# Patient Record
Sex: Male | Born: 1935
Health system: Southern US, Community
[De-identification: ages and names within clinical notes are randomized; demographics above are authoritative.]

## PROBLEM LIST (undated history)

## (undated) DIAGNOSIS — T4145XA Adverse effect of unspecified anesthetic, initial encounter: Secondary | ICD-10-CM

## (undated) DIAGNOSIS — N189 Chronic kidney disease, unspecified: Secondary | ICD-10-CM

## (undated) DIAGNOSIS — E785 Hyperlipidemia, unspecified: Secondary | ICD-10-CM

## (undated) DIAGNOSIS — C801 Malignant (primary) neoplasm, unspecified: Secondary | ICD-10-CM

## (undated) DIAGNOSIS — M545 Low back pain, unspecified: Secondary | ICD-10-CM

## (undated) DIAGNOSIS — I2510 Atherosclerotic heart disease of native coronary artery without angina pectoris: Secondary | ICD-10-CM

## (undated) DIAGNOSIS — G8929 Other chronic pain: Secondary | ICD-10-CM

## (undated) DIAGNOSIS — K000 Anodontia: Secondary | ICD-10-CM

## (undated) DIAGNOSIS — I10 Essential (primary) hypertension: Secondary | ICD-10-CM

## (undated) DIAGNOSIS — J358 Other chronic diseases of tonsils and adenoids: Secondary | ICD-10-CM

## (undated) DIAGNOSIS — K08109 Complete loss of teeth, unspecified cause, unspecified class: Secondary | ICD-10-CM

## (undated) DIAGNOSIS — R7301 Impaired fasting glucose: Secondary | ICD-10-CM

## (undated) DIAGNOSIS — M109 Gout, unspecified: Secondary | ICD-10-CM

## (undated) DIAGNOSIS — T8859XA Other complications of anesthesia, initial encounter: Secondary | ICD-10-CM

## (undated) HISTORY — PX: OTHER SURGICAL HISTORY: SHX169

## (undated) HISTORY — PX: CARDIAC CATHETERIZATION: SHX172

## (undated) HISTORY — PX: HEMORRHOID SURGERY: SHX153

---

## 1995-05-03 HISTORY — PX: RETROPUBIC PROSTATECTOMY: SUR1055

## 1998-01-21 ENCOUNTER — Inpatient Hospital Stay (HOSPITAL_COMMUNITY): Admission: EM | Admit: 1998-01-21 | Discharge: 1998-01-23 | Payer: Self-pay | Admitting: Emergency Medicine

## 2004-01-12 ENCOUNTER — Ambulatory Visit (HOSPITAL_COMMUNITY): Admission: RE | Admit: 2004-01-12 | Discharge: 2004-01-12 | Payer: Self-pay | Admitting: Interventional Cardiology

## 2004-06-08 ENCOUNTER — Emergency Department (HOSPITAL_COMMUNITY): Admission: EM | Admit: 2004-06-08 | Discharge: 2004-06-08 | Payer: Self-pay | Admitting: Emergency Medicine

## 2005-08-31 ENCOUNTER — Encounter: Payer: Self-pay | Admitting: Emergency Medicine

## 2012-03-05 ENCOUNTER — Other Ambulatory Visit: Payer: Self-pay | Admitting: Interventional Cardiology

## 2012-03-07 ENCOUNTER — Ambulatory Visit (HOSPITAL_COMMUNITY): Payer: Medicare Other | Admitting: *Deleted

## 2012-03-07 ENCOUNTER — Encounter (HOSPITAL_COMMUNITY): Payer: Self-pay | Admitting: *Deleted

## 2012-03-07 ENCOUNTER — Encounter (HOSPITAL_COMMUNITY)
Admission: RE | Disposition: A | Discharge status: Home / Self Care | Payer: Self-pay | Source: Ambulatory Visit | Attending: Interventional Cardiology

## 2012-03-07 ENCOUNTER — Ambulatory Visit (HOSPITAL_COMMUNITY)
Admission: RE | Admit: 2012-03-07 | Discharge: 2012-03-07 | Disposition: A | Discharge status: Home / Self Care | Payer: Medicare Other | Source: Ambulatory Visit | Attending: Interventional Cardiology | Admitting: Interventional Cardiology

## 2012-03-07 DIAGNOSIS — I10 Essential (primary) hypertension: Secondary | ICD-10-CM

## 2012-03-07 DIAGNOSIS — I2510 Atherosclerotic heart disease of native coronary artery without angina pectoris: Secondary | ICD-10-CM

## 2012-03-07 DIAGNOSIS — I25810 Atherosclerosis of coronary artery bypass graft(s) without angina pectoris: Secondary | ICD-10-CM

## 2012-03-07 DIAGNOSIS — I4891 Unspecified atrial fibrillation: Secondary | ICD-10-CM | POA: Insufficient documentation

## 2012-03-07 HISTORY — DX: Other complications of anesthesia, initial encounter: T88.59XA

## 2012-03-07 HISTORY — PX: CARDIOVERSION: SHX1299

## 2012-03-07 HISTORY — DX: Essential (primary) hypertension: I10

## 2012-03-07 HISTORY — DX: Anodontia: K00.0

## 2012-03-07 HISTORY — DX: Atherosclerotic heart disease of native coronary artery without angina pectoris: I25.10

## 2012-03-07 HISTORY — DX: Adverse effect of unspecified anesthetic, initial encounter: T41.45XA

## 2012-03-07 HISTORY — DX: Other chronic pain: G89.29

## 2012-03-07 HISTORY — DX: Malignant (primary) neoplasm, unspecified: C80.1

## 2012-03-07 HISTORY — DX: Unspecified atrial fibrillation: I48.91

## 2012-03-07 HISTORY — DX: Hyperlipidemia, unspecified: E78.5

## 2012-03-07 HISTORY — DX: Impaired fasting glucose: R73.01

## 2012-03-07 HISTORY — DX: Low back pain: M54.5

## 2012-03-07 HISTORY — DX: Other chronic diseases of tonsils and adenoids: J35.8

## 2012-03-07 HISTORY — DX: Low back pain, unspecified: M54.50

## 2012-03-07 HISTORY — DX: Complete loss of teeth, unspecified cause, unspecified class: K08.109

## 2012-03-07 HISTORY — DX: Gout, unspecified: M10.9

## 2012-03-07 HISTORY — DX: Chronic kidney disease, unspecified: N18.9

## 2012-03-07 SURGERY — CARDIOVERSION
Anesthesia: Monitor Anesthesia Care | Wound class: Clean

## 2012-03-07 MED ORDER — SODIUM CHLORIDE 0.9 % IV SOLN
INTRAVENOUS | Status: DC
  Administered 2012-03-07: 500 mL via INTRAVENOUS

## 2012-03-07 MED ORDER — AMIODARONE HCL 200 MG PO TABS: 200.0000 mg | ORAL_TABLET | Freq: Every day | ORAL | Status: DC

## 2012-03-07 MED ORDER — LIDOCAINE HCL (CARDIAC) 20 MG/ML IV SOLN
INTRAVENOUS | Status: DC | PRN
  Administered 2012-03-07: 40 mg via INTRAVENOUS

## 2012-03-07 MED ORDER — PROPOFOL 10 MG/ML IV BOLUS
INTRAVENOUS | Status: DC | PRN
  Administered 2012-03-07: 40 mg via INTRAVENOUS

## 2012-03-07 NOTE — Preoperative (Signed)
Beta Blockers   Reason not to administer Beta Blockers:Not Applicable 

## 2012-03-07 NOTE — Anesthesia Postprocedure Evaluation (Signed)
  Anesthesia Post-op Note  Patient: Donald Rasmussen  Procedure(s) Performed: Procedure(s) (LRB) with comments: CARDIOVERSION (N/A)  Patient Location: PACU and Endoscopy Unit  Anesthesia Type:MAC  Level of Consciousness: awake, alert  and oriented  Airway and Oxygen Therapy: Patient Spontanous Breathing and Patient connected to nasal cannula oxygen  Post-op Pain: none  Post-op Assessment: Post-op Vital signs reviewed  Post-op Vital Signs: Reviewed and stable  Complications: No apparent anesthesia complications

## 2012-03-07 NOTE — Addendum Note (Signed)
Addendum  created 03/07/12 1339 by Rivka Barbara, MD   Modules edited:Inpatient Notes

## 2012-03-07 NOTE — CV Procedure (Signed)
Electrical Cardioversion Procedure Note Donald Rasmussen [id number] [birth year]  Procedure: Electrical Cardioversion Indications:  Atrial Fibrillation  Time Out: Verified patient identification, verified procedure,medications/allergies/relevent history reviewed, required imaging and test results available.  Performed  Procedure Details  The patient was NPO after midnight. Anesthesia was administered at the beside  by Dr.G. Katrinka Blazing with 40mg  of propofol.  Cardioversion was done with synchronized biphasic defibrillation with AP pads with 200 watts.  The patient converted to normal sinus rhythm. The patient tolerated the procedure well   IMPRESSION:  Successful cardioversion of atrial fibrillation    Veatrice Kells W 03/07/2012, 12:59 PM

## 2012-03-07 NOTE — Transfer of Care (Signed)
Immediate Anesthesia Transfer of Care Note  Patient: Donald Rasmussen  Procedure(s) Performed: Procedure(s) (LRB) with comments: CARDIOVERSION (N/A)  Patient Location: PACU and Endoscopy Unit  Anesthesia Type:MAC  Level of Consciousness: awake, alert  and oriented  Airway & Oxygen Therapy: Patient Spontanous Breathing and Patient connected to nasal cannula oxygen  Post-op Assessment: Report given to PACU RN and Post -op Vital signs reviewed and stable  Post vital signs: Reviewed and stable  Complications: No apparent anesthesia complications

## 2012-03-07 NOTE — Anesthesia Preprocedure Evaluation (Addendum)
Anesthesia Evaluation  Patient identified by MRN, date of birth, ID band Patient awake    Reviewed: Allergy & Precautions, H&P , NPO status , Patient's Chart, lab work & pertinent test results  Airway Mallampati: I TM Distance: >3 FB Neck ROM: full    Dental   Pulmonary          Cardiovascular hypertension, + CAD + dysrhythmias Atrial Fibrillation Rhythm:regular Rate:Normal     Neuro/Psych    GI/Hepatic   Endo/Other    Renal/GU Renal disease     Musculoskeletal   Abdominal   Peds  Hematology   Anesthesia Other Findings   Reproductive/Obstetrics                           Anesthesia Physical Anesthesia Plan  ASA: III  Anesthesia Plan: General   Post-op Pain Management:    Induction: Intravenous  Airway Management Planned: Mask  Additional Equipment:   Intra-op Plan:   Post-operative Plan:   Informed Consent: I have reviewed the patients History and Physical, chart, labs and discussed the procedure including the risks, benefits and alternatives for the proposed anesthesia with the patient or authorized representative who has indicated his/her understanding and acceptance.     Plan Discussed with: CRNA, Anesthesiologist and Surgeon  Anesthesia Plan Comments:         Anesthesia Quick Evaluation

## 2012-03-07 NOTE — H&P (Addendum)
  Please see the scanned office history and physical.  The patient has relatively new atrial fibrillation that has occurred sometime over the past 6-12 months. He presented with exertional fatigue and dyspnea with decreased LV function. He has been loaded with amiodarone and appropriately anticoagulated now for greater than 4 weeks. He is here today for elective cardioversion. He continues to suffer with exertional fatigue despite excellent rate control.  The procedure including the risks of cardioversion and anesthesia were discussed with the patient. These include death, stroke, ENT or respiratory injury, skin burns, mechanically injury from an the electrical discharge. The patient understands these risks and is willing to proceed . The INR on 03/02/2012 was 3.7.

## 2012-03-08 ENCOUNTER — Encounter (HOSPITAL_COMMUNITY): Payer: Self-pay | Admitting: Interventional Cardiology

## 2013-01-26 ENCOUNTER — Other Ambulatory Visit: Payer: Self-pay | Admitting: Interventional Cardiology

## 2013-01-26 DIAGNOSIS — I4891 Unspecified atrial fibrillation: Secondary | ICD-10-CM

## 2013-02-04 ENCOUNTER — Other Ambulatory Visit: Payer: Medicare Other

## 2013-02-05 ENCOUNTER — Other Ambulatory Visit: Payer: Medicare PPO

## 2013-02-05 ENCOUNTER — Ambulatory Visit (INDEPENDENT_AMBULATORY_CARE_PROVIDER_SITE_OTHER): Payer: Medicare PPO | Admitting: Interventional Cardiology

## 2013-02-05 ENCOUNTER — Encounter: Payer: Self-pay | Admitting: Interventional Cardiology

## 2013-02-05 VITALS — BP 130/82 | HR 47 | Ht 76.0 in | Wt 197.0 lb

## 2013-02-05 DIAGNOSIS — I4891 Unspecified atrial fibrillation: Secondary | ICD-10-CM

## 2013-02-05 DIAGNOSIS — Z79899 Other long term (current) drug therapy: Secondary | ICD-10-CM

## 2013-02-05 DIAGNOSIS — I10 Essential (primary) hypertension: Secondary | ICD-10-CM

## 2013-02-05 DIAGNOSIS — Z7901 Long term (current) use of anticoagulants: Secondary | ICD-10-CM

## 2013-02-05 DIAGNOSIS — I25810 Atherosclerosis of coronary artery bypass graft(s) without angina pectoris: Secondary | ICD-10-CM

## 2013-02-05 LAB — HEPATIC FUNCTION PANEL
ALT: 15 U/L (ref 0–53)
AST: 15 U/L (ref 0–37)
Albumin: 4 g/dL (ref 3.5–5.2)
Alkaline Phosphatase: 54 U/L (ref 39–117)
Bilirubin, Direct: 0.2 mg/dL (ref 0.0–0.3)
Total Bilirubin: 1.2 mg/dL (ref 0.3–1.2)
Total Protein: 6.7 g/dL (ref 6.0–8.3)

## 2013-02-05 LAB — TSH: TSH: 1.53 u[IU]/mL (ref 0.35–5.50)

## 2013-02-05 NOTE — Patient Instructions (Addendum)
DECREASE AMIODARONE TO 100MG  DAILY  A chest x-ray takes a picture of the organs and structures inside the chest, including the heart, lungs, and blood vessels. This test can show several things, including, whether the heart is enlarges; whether fluid is building up in the lungs; and whether pacemaker / defibrillator leads are still in place.  LABS TODAY TSH AND HEPATIC PANEL  Your physician recommends that you schedule a follow-up appointment in: 6 MONTHS WITH DR.SMITH

## 2013-02-05 NOTE — Progress Notes (Signed)
Patient ID: Donald Rasmussen, male   DOB: Jan 12, 1933, 79 y.o.   MRN: [id number]    HPI The patient has no complaints. In each level is improved. He feels great since cardioversion. No bleeding on Coumadin. No medication side effects to Allergies  Allergen Reactions  . Bee Venom Swelling    Current Outpatient Prescriptions  Medication Sig Dispense Refill  . acetaminophen (TYLENOL) 500 MG tablet Take 500 mg by mouth every 6 (six) hours as needed.      Marland Kitchen allopurinol (ZYLOPRIM) 300 MG tablet Take 300 mg by mouth daily.      Marland Kitchen amiodarone (PACERONE) 200 MG tablet Take 100 mg by mouth daily.      Marland Kitchen amLODipine (NORVASC) 5 MG tablet Take 5 mg by mouth daily.      Marland Kitchen atorvastatin (LIPITOR) 20 MG tablet Take 20 mg by mouth daily.      . cholecalciferol (VITAMIN D) 400 UNITS TABS Take 2,000 Units by mouth.      . EPINEPHrine (EPIPEN JR) 0.15 MG/0.3ML injection Inject 0.15 mg into the muscle as needed.      . fish oil-omega-3 fatty acids 1000 MG capsule Take 1,200 mg by mouth daily.      . hydrochlorothiazide (HYDRODIURIL) 25 MG tablet Take 12.5 mg by mouth daily.      . metoprolol (LOPRESSOR) 50 MG tablet Take 50 mg by mouth 2 (two) times daily.      . quinapril (ACCUPRIL) 20 MG tablet Take 20 mg by mouth daily.      Marland Kitchen warfarin (COUMADIN) 1 MG tablet Take 1 mg by mouth as directed. 1 mg qday except 2 mg W/F       No current facility-administered medications for this visit.    Past Medical History  Diagnosis Date  . Coronary artery disease     BMS 1999  . Hypertension   . Hyperlipidemia   . Gout   . Impaired fasting blood sugar   . Cancer     prostate; Dr. Logan Bores  . Edentulous   . Chronic low back pain     2000 lumbosacral spine degenerative change. Possible calculus  . Mucous cyst of tonsil   . Complication of anesthesia   . Chronic kidney disease     Stg III kidney disease; GRF 55    Past Surgical History  Procedure Laterality Date  . Sebaceous cyst back    . Hemorrhoid surgery    .  Retropubic prostatectomy  1997  . Cardiac catheterization      stent   . Cardioversion  03/07/2012    Procedure: CARDIOVERSION;  Surgeon: Lesleigh Noe, MD;  Location: [facility] ENDOSCOPY;  Service: Cardiovascular;  Laterality: N/A;    ROS: Appetite is normal. No weight loss. No bleeding, GI, pulmonary, urinary . PHYSICAL EXAM BP 130/82  Pulse 47  Ht 6\' 4"  (1.93 m)  Wt 197 lb (89.359 kg)  BMI 23.99 kg/m2 Appears his stated age. Chest is clear.  Cardiac exam with no murmur No peripheral edema  EKG: Sinus bradycardia. Prominent voltage. Left axis deviation  ASSESSMENT AND PLAN  1. Status post cardioversion for atrial fibrillation. Maintaining sinus bradycardia.  2. Amiodarone therapy for suppression of atrial fib. I will decrease amiodarone to 100 mg per day. He will have chest x-ray, TSH, and liver panel today.  3. Chronic Coumadin therapy   Overall, patient is doing well. No complications/side effects with his medical regimen. We'll decrease the dose of amiodarone to try to mitigate  downstream side effects/complications.

## 2013-02-06 ENCOUNTER — Telehealth: Payer: Self-pay

## 2013-02-06 ENCOUNTER — Telehealth: Payer: Self-pay | Admitting: Interventional Cardiology

## 2013-02-06 NOTE — Telephone Encounter (Signed)
New message   What medication did Dr Katrinka Blazing change?

## 2013-02-06 NOTE — Telephone Encounter (Signed)
Adv pt wife he is to take 1/2 tablet 100mg  daily of amiodarone. Per pt visit with Dr.Smith on 02/05/13. Pt wife verbalized understanding

## 2013-02-06 NOTE — Telephone Encounter (Signed)
Message copied by Jarvis Newcomer on Wed Feb 06, 2013  2:54 PM ------      Message from: Verdis Prime      Created: Tue Feb 05, 2013  6:13 PM       Liver and thyroid are okay. Please schedule for repat to be done at next OV in 6 months. V58.69 ------

## 2013-02-08 ENCOUNTER — Telehealth: Payer: Self-pay

## 2013-02-08 DIAGNOSIS — I4891 Unspecified atrial fibrillation: Secondary | ICD-10-CM

## 2013-02-08 DIAGNOSIS — I10 Essential (primary) hypertension: Secondary | ICD-10-CM

## 2013-02-08 DIAGNOSIS — I2510 Atherosclerotic heart disease of native coronary artery without angina pectoris: Secondary | ICD-10-CM

## 2013-02-08 NOTE — Telephone Encounter (Signed)
adv pt wife of lab results. Liver and thyroid are okay. pt wife verbalized understanding.

## 2013-02-13 ENCOUNTER — Telehealth: Payer: Self-pay | Admitting: Interventional Cardiology

## 2013-02-13 NOTE — Telephone Encounter (Signed)
None needed

## 2013-02-13 NOTE — Telephone Encounter (Signed)
Encourage to complete the test

## 2013-02-19 ENCOUNTER — Ambulatory Visit (INDEPENDENT_AMBULATORY_CARE_PROVIDER_SITE_OTHER): Payer: Medicare PPO | Admitting: Pharmacist

## 2013-02-19 DIAGNOSIS — I4891 Unspecified atrial fibrillation: Secondary | ICD-10-CM

## 2013-02-19 LAB — POCT INR: INR: 2.1

## 2013-03-05 ENCOUNTER — Encounter: Payer: Self-pay | Admitting: Interventional Cardiology

## 2013-03-19 ENCOUNTER — Ambulatory Visit (INDEPENDENT_AMBULATORY_CARE_PROVIDER_SITE_OTHER): Payer: Medicare PPO | Admitting: Pharmacist

## 2013-03-19 DIAGNOSIS — I4891 Unspecified atrial fibrillation: Secondary | ICD-10-CM

## 2013-03-19 LAB — POCT INR: INR: 2.9

## 2013-05-16 ENCOUNTER — Ambulatory Visit (INDEPENDENT_AMBULATORY_CARE_PROVIDER_SITE_OTHER): Payer: Medicare PPO | Admitting: Pharmacist

## 2013-05-16 DIAGNOSIS — I4891 Unspecified atrial fibrillation: Secondary | ICD-10-CM

## 2013-05-16 LAB — POCT INR: INR: 1.6

## 2013-05-16 MED ORDER — WARFARIN SODIUM 1 MG PO TABS: ORAL_TABLET | ORAL | Status: DC

## 2013-05-30 ENCOUNTER — Ambulatory Visit (INDEPENDENT_AMBULATORY_CARE_PROVIDER_SITE_OTHER): Payer: Medicare PPO | Admitting: Pharmacist

## 2013-05-30 DIAGNOSIS — Z5181 Encounter for therapeutic drug level monitoring: Secondary | ICD-10-CM

## 2013-05-30 DIAGNOSIS — I4891 Unspecified atrial fibrillation: Secondary | ICD-10-CM

## 2013-05-30 LAB — POCT INR: INR: 2.6

## 2013-06-07 ENCOUNTER — Other Ambulatory Visit: Payer: Self-pay | Admitting: *Deleted

## 2013-06-07 MED ORDER — ATORVASTATIN CALCIUM 20 MG PO TABS: 20.0000 mg | ORAL_TABLET | Freq: Every day | ORAL | Status: DC

## 2013-07-04 ENCOUNTER — Ambulatory Visit (INDEPENDENT_AMBULATORY_CARE_PROVIDER_SITE_OTHER): Payer: Medicare PPO | Admitting: Pharmacist

## 2013-07-04 DIAGNOSIS — I4891 Unspecified atrial fibrillation: Secondary | ICD-10-CM

## 2013-07-04 DIAGNOSIS — Z5181 Encounter for therapeutic drug level monitoring: Secondary | ICD-10-CM

## 2013-07-04 LAB — POCT INR: INR: 2.2

## 2013-07-05 ENCOUNTER — Telehealth: Payer: Self-pay | Admitting: *Deleted

## 2013-07-05 ENCOUNTER — Other Ambulatory Visit: Payer: Self-pay | Admitting: *Deleted

## 2013-07-05 MED ORDER — WARFARIN SODIUM 1 MG PO TABS: ORAL_TABLET | ORAL | Status: DC

## 2013-07-05 MED ORDER — METOPROLOL TARTRATE 50 MG PO TABS: 50.0000 mg | ORAL_TABLET | Freq: Two times a day (BID) | ORAL | Status: DC

## 2013-07-05 NOTE — Telephone Encounter (Signed)
Patients wife stated that he is out of coumadin and would like a refill sent to [facility] on [address]. Thanks, MI

## 2013-08-01 ENCOUNTER — Ambulatory Visit (INDEPENDENT_AMBULATORY_CARE_PROVIDER_SITE_OTHER): Payer: Medicare PPO | Admitting: Pharmacist Clinician (PhC)/ Clinical Pharmacy Specialist

## 2013-08-01 DIAGNOSIS — Z5181 Encounter for therapeutic drug level monitoring: Secondary | ICD-10-CM

## 2013-08-01 DIAGNOSIS — I4891 Unspecified atrial fibrillation: Secondary | ICD-10-CM

## 2013-08-01 LAB — POCT INR: INR: 2.7

## 2013-08-01 MED ORDER — METOPROLOL TARTRATE 50 MG PO TABS: 50.0000 mg | ORAL_TABLET | Freq: Two times a day (BID) | ORAL | Status: DC

## 2013-08-01 MED ORDER — WARFARIN SODIUM 1 MG PO TABS: ORAL_TABLET | ORAL | Status: DC

## 2013-08-01 MED ORDER — AMLODIPINE BESYLATE 5 MG PO TABS: 5.0000 mg | ORAL_TABLET | Freq: Every day | ORAL | Status: DC

## 2013-08-01 MED ORDER — QUINAPRIL HCL 20 MG PO TABS: 20.0000 mg | ORAL_TABLET | Freq: Every day | ORAL | Status: DC

## 2013-08-01 MED ORDER — ATORVASTATIN CALCIUM 20 MG PO TABS: 20.0000 mg | ORAL_TABLET | Freq: Every day | ORAL | Status: DC

## 2013-08-01 MED ORDER — AMIODARONE HCL 200 MG PO TABS: 100.0000 mg | ORAL_TABLET | Freq: Every day | ORAL | Status: DC

## 2013-08-12 ENCOUNTER — Encounter: Payer: Self-pay | Admitting: Interventional Cardiology

## 2013-08-12 ENCOUNTER — Ambulatory Visit (INDEPENDENT_AMBULATORY_CARE_PROVIDER_SITE_OTHER): Payer: Medicare PPO | Admitting: Interventional Cardiology

## 2013-08-12 VITALS — BP 158/82 | HR 50 | Ht 76.0 in | Wt 195.2 lb

## 2013-08-12 DIAGNOSIS — I2510 Atherosclerotic heart disease of native coronary artery without angina pectoris: Secondary | ICD-10-CM | POA: Insufficient documentation

## 2013-08-12 DIAGNOSIS — Z9229 Personal history of other drug therapy: Secondary | ICD-10-CM

## 2013-08-12 DIAGNOSIS — Z79899 Other long term (current) drug therapy: Secondary | ICD-10-CM

## 2013-08-12 DIAGNOSIS — I4891 Unspecified atrial fibrillation: Secondary | ICD-10-CM

## 2013-08-12 DIAGNOSIS — Z7901 Long term (current) use of anticoagulants: Secondary | ICD-10-CM | POA: Insufficient documentation

## 2013-08-12 DIAGNOSIS — I10 Essential (primary) hypertension: Secondary | ICD-10-CM

## 2013-08-12 HISTORY — DX: Long term (current) use of anticoagulants: Z79.01

## 2013-08-12 HISTORY — DX: Personal history of other drug therapy: Z92.29

## 2013-08-12 LAB — HEPATIC FUNCTION PANEL
ALT: 14 U/L (ref 0–53)
AST: 16 U/L (ref 0–37)
Albumin: 3.9 g/dL (ref 3.5–5.2)
Alkaline Phosphatase: 67 U/L (ref 39–117)
Bilirubin, Direct: 0 mg/dL (ref 0.0–0.3)
Total Bilirubin: 0.5 mg/dL (ref 0.3–1.2)
Total Protein: 6.7 g/dL (ref 6.0–8.3)

## 2013-08-12 LAB — TSH: TSH: 0.97 u[IU]/mL (ref 0.35–5.50)

## 2013-08-12 NOTE — Patient Instructions (Signed)
Your physician recommends that you return for lab work today for tsh and hepatic panel.  Your physician recommends that you continue on your current medications as directed. Please refer to the Current Medication list given to you today.  Your physician wants you to follow-up in: 6-8 months with [name]. You will receive a reminder letter in the mail two months in advance. If you don't receive a letter, please call our office to schedule the follow-up appointment.

## 2013-08-12 NOTE — Progress Notes (Signed)
Patient ID: Donald Rasmussen, male   DOB: 07-Dec-1932, 80 y.o.   MRN: [id number]    [address] [place], [place]  [address] Phone: [phone] Fax:  [phone]  Date:  08/12/2013   ID:  Donald Rasmussen, DOB [birth year], MRN [id number]  PCP:  Mathews Argyle, MD   ASSESSMENT:  1. Paroxysmal atrial fibrillation, controlled on low-dose amiodarone 2. Amiodarone therapy, monitoring for toxicity 3. Asymptomatic coronary artery disease 4. Chronic diastolic heart failure, stable without evidence of fluid overload  PLAN:  1. TSH and hepatic panel 2. Clinical followup in 6 months at which time blood work will be repeated and a chest x-ray will be done to followup amiodarone toxicity 3. Continue current active lifestyle and notify us of any cardiopulmonary complaints   SUBJECTIVE: Donald Rasmussen is a 80 y.o. male who is doing well and denies palpitations, angina, dyspnea, and syncope. No medication side effects.   Wt Readings from Last 3 Encounters:  08/12/13 195 lb 3.2 oz (88.542 kg)  02/05/13 197 lb (89.359 kg)  03/07/12 201 lb (91.173 kg)     Past Medical History  Diagnosis Date  . Coronary artery disease     BMS 1999  . Hypertension   . Hyperlipidemia   . Gout   . Impaired fasting blood sugar   . Cancer     prostate; Dr. Amalia Hailey  . Edentulous   . Chronic low back pain     2000 lumbosacral spine degenerative change. Possible calculus  . Mucous cyst of tonsil   . Complication of anesthesia   . Chronic kidney disease     Stg III kidney disease; GRF 55    Current Outpatient Prescriptions  Medication Sig Dispense Refill  . acetaminophen (TYLENOL) 500 MG tablet Take 500 mg by mouth every 6 (six) hours as needed.      Marland Kitchen allopurinol (ZYLOPRIM) 300 MG tablet Take 300 mg by mouth daily.      Marland Kitchen amiodarone (PACERONE) 200 MG tablet Take 0.5 tablets (100 mg total) by mouth daily.  90 tablet  1  . amLODipine (NORVASC) 5 MG tablet Take 1 tablet (5 mg total) by mouth  daily.  90 tablet  1  . atorvastatin (LIPITOR) 20 MG tablet Take 1 tablet (20 mg total) by mouth daily.  90 tablet  1  . cholecalciferol (VITAMIN D) 400 UNITS TABS Take 2,000 Units by mouth.      . EPINEPHrine (EPIPEN JR) 0.15 MG/0.3ML injection Inject 0.15 mg into the muscle as needed.      . fish oil-omega-3 fatty acids 1000 MG capsule Take 1,200 mg by mouth daily.      . metoprolol (LOPRESSOR) 50 MG tablet Take 1 tablet (50 mg total) by mouth 2 (two) times daily.  90 tablet  1  . quinapril (ACCUPRIL) 20 MG tablet Take 1 tablet (20 mg total) by mouth daily.  90 tablet  1  . warfarin (COUMADIN) 1 MG tablet Take 1-2 tablets by mouth daily as directed  120 tablet  1   No current facility-administered medications for this visit.    Allergies:    Allergies  Allergen Reactions  . Bee Venom Swelling    Social History:  The patient  reports that he has quit smoking. He does not have any smokeless tobacco history on file. He reports that he drinks alcohol. He reports that he does not use illicit drugs.   ROS:  Please see the history of present illness.  No stroke symptoms. Appetite is stable.   All other systems reviewed and negative.   OBJECTIVE: VS:  BP 158/82  Pulse 50  Ht 6\' 4"  (1.93 m)  Wt 195 lb 3.2 oz (88.542 kg)  BMI 23.77 kg/m2 Well nourished, well developed, in no acute distress, elderly HEENT: normal Neck: JVD flat. Carotid bruit absent  Cardiac:  normal S1, S2; RRR; no murmur Lungs:  clear to auscultation bilaterally, no wheezing, rhonchi or rales Abd: soft, nontender, no hepatomegaly Ext: Edema absent. Pulses 2+ and symmetric Skin: warm and dry Neuro:  CNs 2-12 intact, no focal abnormalities noted  EKG:  Not repeated   Signed, Illene Labrador III, MD 08/12/2013 3:01 PM

## 2013-08-12 NOTE — Addendum Note (Signed)
Addended by: Eulis Foster on: 08/12/2013 03:13 PM   Modules accepted: Orders

## 2013-08-22 ENCOUNTER — Telehealth: Payer: Self-pay

## 2013-08-22 NOTE — Telephone Encounter (Signed)
Pt aware of lab results.  Liver and thyroid tests are normal.pt verbalized understanding.

## 2013-08-22 NOTE — Telephone Encounter (Signed)
Message copied by Lamar Laundry on Thu Aug 22, 2013 12:26 PM ------      Message from: Daneen Schick      Created: Tue Aug 20, 2013  6:48 PM       Liver and thyroid tests are normal ------

## 2013-08-29 ENCOUNTER — Ambulatory Visit (INDEPENDENT_AMBULATORY_CARE_PROVIDER_SITE_OTHER): Payer: Medicare PPO | Admitting: *Deleted

## 2013-08-29 DIAGNOSIS — I4891 Unspecified atrial fibrillation: Secondary | ICD-10-CM

## 2013-08-29 LAB — POCT INR: INR: 2.3

## 2013-10-10 ENCOUNTER — Ambulatory Visit (INDEPENDENT_AMBULATORY_CARE_PROVIDER_SITE_OTHER): Payer: Medicare PPO | Admitting: *Deleted

## 2013-10-10 DIAGNOSIS — I4891 Unspecified atrial fibrillation: Secondary | ICD-10-CM

## 2013-10-10 LAB — POCT INR: INR: 3.2

## 2013-10-31 ENCOUNTER — Ambulatory Visit (INDEPENDENT_AMBULATORY_CARE_PROVIDER_SITE_OTHER): Payer: Medicare PPO | Admitting: *Deleted

## 2013-10-31 DIAGNOSIS — I4891 Unspecified atrial fibrillation: Secondary | ICD-10-CM

## 2013-10-31 LAB — POCT INR: INR: 3.5

## 2013-11-14 ENCOUNTER — Ambulatory Visit (INDEPENDENT_AMBULATORY_CARE_PROVIDER_SITE_OTHER): Payer: Medicare PPO | Admitting: *Deleted

## 2013-11-14 DIAGNOSIS — I4891 Unspecified atrial fibrillation: Secondary | ICD-10-CM

## 2013-11-14 LAB — POCT INR: INR: 1.8

## 2013-11-14 MED ORDER — WARFARIN SODIUM 1 MG PO TABS: 1.0000 mg | ORAL_TABLET | ORAL | Status: DC

## 2013-11-29 ENCOUNTER — Ambulatory Visit (INDEPENDENT_AMBULATORY_CARE_PROVIDER_SITE_OTHER): Payer: Medicare PPO | Admitting: Pharmacist

## 2013-11-29 DIAGNOSIS — I4891 Unspecified atrial fibrillation: Secondary | ICD-10-CM

## 2013-11-29 LAB — POCT INR: INR: 2.6

## 2013-12-18 ENCOUNTER — Other Ambulatory Visit: Payer: Self-pay | Admitting: *Deleted

## 2013-12-18 MED ORDER — METOPROLOL TARTRATE 50 MG PO TABS: 50.0000 mg | ORAL_TABLET | Freq: Two times a day (BID) | ORAL | Status: DC

## 2013-12-20 ENCOUNTER — Ambulatory Visit (INDEPENDENT_AMBULATORY_CARE_PROVIDER_SITE_OTHER): Payer: Medicare PPO | Admitting: Pharmacist

## 2013-12-20 DIAGNOSIS — I4891 Unspecified atrial fibrillation: Secondary | ICD-10-CM

## 2013-12-20 LAB — POCT INR: INR: 2.3

## 2014-01-13 ENCOUNTER — Other Ambulatory Visit: Payer: Self-pay | Admitting: *Deleted

## 2014-01-13 MED ORDER — ATORVASTATIN CALCIUM 20 MG PO TABS: 20.0000 mg | ORAL_TABLET | Freq: Every day | ORAL | Status: DC

## 2014-01-17 ENCOUNTER — Ambulatory Visit (INDEPENDENT_AMBULATORY_CARE_PROVIDER_SITE_OTHER): Payer: Medicare PPO

## 2014-01-17 DIAGNOSIS — I4891 Unspecified atrial fibrillation: Secondary | ICD-10-CM

## 2014-01-17 LAB — POCT INR: INR: 3.6

## 2014-02-05 ENCOUNTER — Ambulatory Visit (INDEPENDENT_AMBULATORY_CARE_PROVIDER_SITE_OTHER): Payer: Medicare PPO | Admitting: Pharmacist

## 2014-02-05 DIAGNOSIS — I4891 Unspecified atrial fibrillation: Secondary | ICD-10-CM

## 2014-02-05 LAB — POCT INR: INR: 2.4

## 2014-02-18 ENCOUNTER — Ambulatory Visit (INDEPENDENT_AMBULATORY_CARE_PROVIDER_SITE_OTHER): Payer: Medicare PPO | Admitting: Interventional Cardiology

## 2014-02-18 ENCOUNTER — Encounter: Payer: Self-pay | Admitting: Interventional Cardiology

## 2014-02-18 VITALS — BP 130/70 | HR 44 | Ht 75.0 in | Wt 192.4 lb

## 2014-02-18 DIAGNOSIS — I480 Paroxysmal atrial fibrillation: Secondary | ICD-10-CM

## 2014-02-18 DIAGNOSIS — Z9229 Personal history of other drug therapy: Secondary | ICD-10-CM

## 2014-02-18 DIAGNOSIS — I10 Essential (primary) hypertension: Secondary | ICD-10-CM

## 2014-02-18 DIAGNOSIS — R001 Bradycardia, unspecified: Secondary | ICD-10-CM

## 2014-02-18 DIAGNOSIS — I25810 Atherosclerosis of coronary artery bypass graft(s) without angina pectoris: Secondary | ICD-10-CM

## 2014-02-18 DIAGNOSIS — Z09 Encounter for follow-up examination after completed treatment for conditions other than malignant neoplasm: Secondary | ICD-10-CM

## 2014-02-18 LAB — HEPATIC FUNCTION PANEL
ALBUMIN: 3.7 g/dL (ref 3.5–5.2)
ALT: 16 U/L (ref 0–53)
AST: 21 U/L (ref 0–37)
Alkaline Phosphatase: 58 U/L (ref 39–117)
Bilirubin, Direct: 0.2 mg/dL (ref 0.0–0.3)
TOTAL PROTEIN: 7.2 g/dL (ref 6.0–8.3)
Total Bilirubin: 1.2 mg/dL (ref 0.2–1.2)

## 2014-02-18 LAB — TSH: TSH: 1.81 u[IU]/mL (ref 0.35–4.50)

## 2014-02-18 MED ORDER — QUINAPRIL HCL 20 MG PO TABS
20.0000 mg | ORAL_TABLET | Freq: Every day | ORAL | Status: DC
Start: 2014-02-18 — End: 2015-04-04

## 2014-02-18 MED ORDER — METOPROLOL TARTRATE 25 MG PO TABS: 25.0000 mg | ORAL_TABLET | Freq: Two times a day (BID) | ORAL | Status: DC

## 2014-02-18 NOTE — Patient Instructions (Signed)
Your physician has recommended you make the following change in your medication:  1) REDUCE Metoprolol to 25mg  twice daily. An Rx has been sent to your pharmacy  Your physician has recommended that you wear a holter monitor. Holter monitors are medical devices that record the heart's electrical activity. Doctors most often use these monitors to diagnose arrhythmias. Arrhythmias are problems with the speed or rhythm of the heartbeat. The monitor is a small, portable device. You can wear one while you do your normal daily activities. This is usually used to diagnose what is causing palpitations/syncope (passing out).(To be worn after 02/25/14)  Lab Today: Tsh,Hepatic  Your physician wants you to follow-up in: 6 months You will receive a reminder letter in the mail two months in advance. If you don't receive a letter, please call our office to schedule the follow-up appointment.

## 2014-02-18 NOTE — Progress Notes (Signed)
Patient ID: Donald Rasmussen, male   DOB: 1932-08-22, 80 y.o.   MRN: [id number]    [address], [address] [place], [place]  [address] Phone: [phone] Fax:  [phone]  Date:  02/18/2014   ID:  Donald Rasmussen, DOB 1932-05-28, MRN [id number]  PCP:  Mathews Argyle, MD   ASSESSMENT:  1. Marked sinus bradycardia on amiodarone 100 mg per day and metoprolol 50 mg twice a day 2. History of atrial fibrillation 3. Chronic amiodarone therapy 4. CAD with bare-metal stent 1999, asymptomatic  PLAN:  1. Decrease metoprolol to 25 mg twice a day 2. TSH and hepatic panel 3. Followup clinic appointment in 6 months 4. 24-hour Holter monitor after metoprolol dose has been decreased for 7 days.   SUBJECTIVE: Donald Rasmussen is a 80 y.o. male who has occasional dizziness. He has had no tachycardia palpitations or syncope. He denies angina. There has been no decrease in appetite or other complaints. He denies lower extremity edema.   Wt Readings from Last 3 Encounters:  02/18/14 192 lb 6.4 oz (87.272 kg)  08/12/13 195 lb 3.2 oz (88.542 kg)  02/05/13 197 lb (89.359 kg)     Past Medical History  Diagnosis Date  . Coronary artery disease     BMS 1999  . Hypertension   . Hyperlipidemia   . Gout   . Impaired fasting blood sugar   . Cancer     prostate; Dr. Amalia Hailey  . Edentulous   . Chronic low back pain     2000 lumbosacral spine degenerative change. Possible calculus  . Mucous cyst of tonsil   . Complication of anesthesia   . Chronic kidney disease     Stg III kidney disease; GRF 55    Current Outpatient Prescriptions  Medication Sig Dispense Refill  . acetaminophen (TYLENOL) 500 MG tablet Take 500 mg by mouth every 6 (six) hours as needed.      Marland Kitchen allopurinol (ZYLOPRIM) 300 MG tablet Take 300 mg by mouth daily.      Marland Kitchen amiodarone (PACERONE) 200 MG tablet Take 0.5 tablets (100 mg total) by mouth daily.  90 tablet  1  . amLODipine (NORVASC) 5 MG tablet Take 1 tablet (5 mg total) by  mouth daily.  90 tablet  1  . atorvastatin (LIPITOR) 20 MG tablet Take 1 tablet (20 mg total) by mouth daily.  90 tablet  0  . cholecalciferol (VITAMIN D) 400 UNITS TABS Take 2,000 Units by mouth.      . EPINEPHrine (EPIPEN JR) 0.15 MG/0.3ML injection Inject 0.15 mg into the muscle as needed.      . fish oil-omega-3 fatty acids 1000 MG capsule Take 1,200 mg by mouth daily.      . metoprolol (LOPRESSOR) 50 MG tablet Take 1 tablet (50 mg total) by mouth 2 (two) times daily.  60 tablet  1  . quinapril (ACCUPRIL) 20 MG tablet Take 1 tablet (20 mg total) by mouth daily.  90 tablet  1  . warfarin (COUMADIN) 1 MG tablet Take 1 tablet (1 mg total) by mouth as directed.  120 tablet  1   No current facility-administered medications for this visit.    Allergies:    Allergies  Allergen Reactions  . Bee Venom Swelling    Social History:  The patient  reports that he has quit smoking. He does not have any smokeless tobacco history on file. He reports that he drinks alcohol. He reports that he does not  use illicit drugs.   ROS:  Please see the history of present illness.   Denies transient neurological complaints. No lower extremity edema.   All other systems reviewed and negative.   OBJECTIVE: VS:  BP 130/70  Pulse 44  Ht 6\' 3"  (1.905 m)  Wt 192 lb 6.4 oz (87.272 kg)  BMI 24.05 kg/m2 Well nourished, well developed, in no acute distress, appears than stated age HEENT: normal Neck: JVD flat. Carotid bruit absent  Cardiac:  normal S1, S2; RRR; no murmur. Bradycardic. Lungs:  clear to auscultation bilaterally, no wheezing, rhonchi or rales Abd: soft, nontender, no hepatomegaly Ext: Edema absent. Pulses 2+ and symmetric Skin: warm and dry Neuro:  CNs 2-12 intact, no focal abnormalities noted  EKG:  Marked sinus bradycardia with rate of 44 beats per minute.       Signed, Illene Labrador III, MD 02/18/2014 3:02 PM

## 2014-02-20 ENCOUNTER — Telehealth: Payer: Self-pay

## 2014-02-20 NOTE — Telephone Encounter (Signed)
Message copied by Lamar Laundry on Thu Feb 20, 2014 10:06 AM ------      Message from: Daneen Schick      Created: Wed Feb 19, 2014  5:02 PM       Normal studies with no evidence toxicity ------

## 2014-02-20 NOTE — Telephone Encounter (Signed)
Pt aware of lab results.Normal studies with no evidence toxicity.pt verbalized understanding.

## 2014-02-25 ENCOUNTER — Encounter (INDEPENDENT_AMBULATORY_CARE_PROVIDER_SITE_OTHER): Payer: Medicare PPO

## 2014-02-25 ENCOUNTER — Encounter: Payer: Self-pay | Admitting: *Deleted

## 2014-02-25 DIAGNOSIS — I480 Paroxysmal atrial fibrillation: Secondary | ICD-10-CM

## 2014-02-25 DIAGNOSIS — R001 Bradycardia, unspecified: Secondary | ICD-10-CM

## 2014-02-25 NOTE — Progress Notes (Signed)
Patient ID: Donald Rasmussen, male   DOB: 04-10-1933, 80 y.o.   MRN: [id number] [facility] 24 hour holter monitor applied to patient.

## 2014-02-26 ENCOUNTER — Ambulatory Visit (INDEPENDENT_AMBULATORY_CARE_PROVIDER_SITE_OTHER): Payer: Medicare PPO | Admitting: *Deleted

## 2014-02-26 DIAGNOSIS — I4891 Unspecified atrial fibrillation: Secondary | ICD-10-CM

## 2014-02-26 LAB — POCT INR: INR: 3.3

## 2014-02-26 MED ORDER — WARFARIN SODIUM 1 MG PO TABS: 1.0000 mg | ORAL_TABLET | ORAL | Status: DC

## 2014-03-01 ENCOUNTER — Other Ambulatory Visit: Payer: Self-pay

## 2014-03-01 MED ORDER — AMLODIPINE BESYLATE 5 MG PO TABS: 5.0000 mg | ORAL_TABLET | Freq: Every day | ORAL | Status: DC

## 2014-03-12 ENCOUNTER — Ambulatory Visit (INDEPENDENT_AMBULATORY_CARE_PROVIDER_SITE_OTHER): Payer: Medicare PPO | Admitting: *Deleted

## 2014-03-12 DIAGNOSIS — I4891 Unspecified atrial fibrillation: Secondary | ICD-10-CM

## 2014-03-12 LAB — POCT INR: INR: 2.7

## 2014-03-18 ENCOUNTER — Telehealth: Payer: Self-pay

## 2014-03-18 NOTE — Telephone Encounter (Signed)
Pt wife aware of holter monitor results -Test OK if no symptoms -Blunted HR range 48-83 bpm -Possible chronotropic incompetence Pt wife verbalized understanding Pt wife reports that pt is doing well with no cardiac complaints

## 2014-04-09 ENCOUNTER — Ambulatory Visit (INDEPENDENT_AMBULATORY_CARE_PROVIDER_SITE_OTHER): Payer: Medicare PPO | Admitting: Pharmacist

## 2014-04-09 DIAGNOSIS — I4891 Unspecified atrial fibrillation: Secondary | ICD-10-CM

## 2014-04-09 LAB — POCT INR: INR: 2.8

## 2014-05-07 ENCOUNTER — Ambulatory Visit (INDEPENDENT_AMBULATORY_CARE_PROVIDER_SITE_OTHER): Payer: Medicare PPO | Admitting: Pharmacist

## 2014-05-07 DIAGNOSIS — I4891 Unspecified atrial fibrillation: Secondary | ICD-10-CM

## 2014-05-07 LAB — POCT INR: INR: 2.3

## 2014-05-14 ENCOUNTER — Other Ambulatory Visit: Payer: Self-pay

## 2014-05-14 MED ORDER — ATORVASTATIN CALCIUM 20 MG PO TABS: 20.0000 mg | ORAL_TABLET | Freq: Every day | ORAL | Status: DC

## 2014-05-27 ENCOUNTER — Telehealth: Payer: Self-pay | Admitting: Pharmacist

## 2014-05-27 NOTE — Telephone Encounter (Signed)
New MEssage  YAw from [facility] was calling about a change in manufacturer for a Warfarin medication. Please call back and discuss.

## 2014-05-27 NOTE — Telephone Encounter (Signed)
Spoke with [facility] pharmacist.  Faythe Ghee to change manufacturer.

## 2014-06-18 ENCOUNTER — Ambulatory Visit (INDEPENDENT_AMBULATORY_CARE_PROVIDER_SITE_OTHER): Payer: Medicare PPO | Admitting: *Deleted

## 2014-06-18 DIAGNOSIS — I4891 Unspecified atrial fibrillation: Secondary | ICD-10-CM

## 2014-06-18 LAB — POCT INR: INR: 2.1

## 2014-07-30 ENCOUNTER — Ambulatory Visit (INDEPENDENT_AMBULATORY_CARE_PROVIDER_SITE_OTHER): Payer: Medicare PPO | Admitting: *Deleted

## 2014-07-30 DIAGNOSIS — I4891 Unspecified atrial fibrillation: Secondary | ICD-10-CM

## 2014-07-30 LAB — POCT INR: INR: 2.8

## 2014-08-19 ENCOUNTER — Ambulatory Visit (INDEPENDENT_AMBULATORY_CARE_PROVIDER_SITE_OTHER): Payer: Medicare PPO | Admitting: Interventional Cardiology

## 2014-08-19 ENCOUNTER — Encounter: Payer: Self-pay | Admitting: Interventional Cardiology

## 2014-08-19 VITALS — BP 132/42 | HR 58 | Ht 75.0 in | Wt 187.0 lb

## 2014-08-19 DIAGNOSIS — Z7901 Long term (current) use of anticoagulants: Secondary | ICD-10-CM

## 2014-08-19 DIAGNOSIS — Z09 Encounter for follow-up examination after completed treatment for conditions other than malignant neoplasm: Secondary | ICD-10-CM

## 2014-08-19 DIAGNOSIS — I480 Paroxysmal atrial fibrillation: Secondary | ICD-10-CM

## 2014-08-19 DIAGNOSIS — Z9229 Personal history of other drug therapy: Secondary | ICD-10-CM

## 2014-08-19 DIAGNOSIS — I2510 Atherosclerotic heart disease of native coronary artery without angina pectoris: Secondary | ICD-10-CM | POA: Diagnosis not present

## 2014-08-19 DIAGNOSIS — I10 Essential (primary) hypertension: Secondary | ICD-10-CM

## 2014-08-19 LAB — HEPATIC FUNCTION PANEL
ALT: 11 U/L (ref 0–53)
AST: 14 U/L (ref 0–37)
Albumin: 3.8 g/dL (ref 3.5–5.2)
Alkaline Phosphatase: 61 U/L (ref 39–117)
Bilirubin, Direct: 0.2 mg/dL (ref 0.0–0.3)
Total Bilirubin: 0.8 mg/dL (ref 0.2–1.2)
Total Protein: 6.6 g/dL (ref 6.0–8.3)

## 2014-08-19 LAB — TSH: TSH: 0.02 u[IU]/mL — ABNORMAL LOW (ref 0.35–4.50)

## 2014-08-19 NOTE — Patient Instructions (Signed)
Medication Instructions:  Your physician recommends that you continue on your current medications as directed. Please refer to the Current Medication list given to you today.   Labwork: Tsh, Hepatic today  Testing/Procedures: None   Follow-Up: Your physician wants you to follow-up in: 6 months with Dr.Smith You will receive a reminder letter in the mail two months in advance. If you don't receive a letter, please call our office to schedule the follow-up appointment.   Any Other Special Instructions Will Be Listed Below (If Applicable).

## 2014-08-19 NOTE — Progress Notes (Signed)
Cardiology Office Note   Date:  08/19/2014   ID:  Donald Rasmussen, DOB 10-08-1932, MRN [id number]  PCP:  Mathews Argyle, MD  Cardiologist:   Sinclair Grooms, MD   Chief Complaint  Patient presents with  . Coronary Artery Disease      History of Present Illness: Donald Rasmussen is a 81 y.o. male who presents for CAD, prior stent, paroxysmal atrial fibrillation, acute on chronic diastolic heart failure when in atrial fibrillation, hypertension, and tail chronic amiodarone therapy for AF suppression.  He denies syncope, chest pain, palpitations, and edema. There is no orthopnea. Exertional tolerance is been stable. No blood in his urine or stool. No transient neurological symptoms.  Past Medical History  Diagnosis Date  . Coronary artery disease     BMS 1999  . Hypertension   . Hyperlipidemia   . Gout   . Impaired fasting blood sugar   . Cancer     prostate; Dr. Amalia Hailey  . Edentulous   . Chronic low back pain     2000 lumbosacral spine degenerative change. Possible calculus  . Mucous cyst of tonsil   . Complication of anesthesia   . Chronic kidney disease     Stg III kidney disease; GRF 79    Past Surgical History  Procedure Laterality Date  . Sebaceous cyst back    . Hemorrhoid surgery    . Retropubic prostatectomy  1997  . Cardiac catheterization      stent   . Cardioversion  03/07/2012    Procedure: CARDIOVERSION;  Surgeon: Sinclair Grooms, MD;  Location: [facility];  Service: Cardiovascular;  Laterality: N/A;     Current Outpatient Prescriptions  Medication Sig Dispense Refill  . acetaminophen (TYLENOL) 500 MG tablet Take 500 mg by mouth every 6 (six) hours as needed (pain).     Marland Kitchen allopurinol (ZYLOPRIM) 300 MG tablet Take 300 mg by mouth daily.    Marland Kitchen amiodarone (PACERONE) 200 MG tablet Take 0.5 tablets (100 mg total) by mouth daily. 90 tablet 1  . amLODipine (NORVASC) 5 MG tablet Take 1 tablet (5 mg total) by mouth daily. 90 tablet 3  . atorvastatin  (LIPITOR) 20 MG tablet Take 1 tablet (20 mg total) by mouth daily. 90 tablet 1  . cholecalciferol (VITAMIN D) 400 UNITS TABS Take 2,000 Units by mouth.    . EPINEPHrine (EPIPEN JR) 0.15 MG/0.3ML injection Inject 0.15 mg into the muscle as needed for anaphylaxis.     . fish oil-omega-3 fatty acids 1000 MG capsule Take 1,200 mg by mouth daily.    . metoprolol (LOPRESSOR) 25 MG tablet Take 1 tablet (25 mg total) by mouth 2 (two) times daily. 60 tablet 11  . quinapril (ACCUPRIL) 20 MG tablet Take 1 tablet (20 mg total) by mouth daily. 60 tablet 11  . warfarin (COUMADIN) 1 MG tablet Take 1 tablet (1 mg total) by mouth as directed. 120 tablet 1   No current facility-administered medications for this visit.    Allergies:   Bee venom    Social History:  The patient  reports that he has quit smoking. He does not have any smokeless tobacco history on file. He reports that he drinks alcohol. He reports that he does not use illicit drugs.   Family History:  The patient's family history includes Heart attack in his father and mother; Hyperlipidemia in his mother.    ROS:  Please see the history of present illness.   Otherwise, review  of systems are positive for bilateral numbness in his feet.   All other systems are reviewed and negative.    PHYSICAL EXAM: VS:  BP 132/42 mmHg  Pulse 58  Ht 6\' 3"  (1.905 m)  Wt 187 lb (84.823 kg)  BMI 23.37 kg/m2 , BMI Body mass index is 23.37 kg/(m^2). GEN: Well nourished, well developed, in no acute distress HEENT: normal Neck: no JVD, carotid bruits, or masses Cardiac: slow RRR; no murmurs, rubs, or gallops,no edema  Respiratory:  clear to auscultation bilaterally, normal work of breathing GI: soft, nontender, nondistended, + BS MS: no deformity or atrophy Skin: warm and dry, no rash Neuro:  Strength and sensation are intact Psych: euthymic mood, full affect   EKG:  EKG is not ordered today.   Recent Labs: 02/18/2014: ALT 16; TSH 1.81    Lipid  Panel No results found for: CHOL, TRIG, HDL, CHOLHDL, VLDL, LDLCALC, LDLDIRECT    Wt Readings from Last 3 Encounters:  08/19/14 187 lb (84.823 kg)  02/18/14 192 lb 6.4 oz (87.272 kg)  08/12/13 195 lb 3.2 oz (88.542 kg)      Other studies Reviewed: Additional studies/ records that were reviewed today include: .    ASSESSMENT AND PLAN: Coronary artery disease involving native heart without angina pectoris: Stable without angina  Paroxysmal atrial fibrillation: Stable without recurrence  History of amiodarone therapy: No side effects  Long term current use of anticoagulant therapy: No bleeding  Essential hypertension stable     Current medicines are reviewed at length with the patient today.  The patient does not have concerns regarding medicines.  The following changes have been made:  no change  Labs/ tests ordered today include:  No orders of the defined types were placed in this encounter.     Disposition:   FU with H. sMITH in 6 months   Signed, Sinclair Grooms, MD  08/19/2014 11:50 AM    Donald Rasmussen, Donald Rasmussen, Donald [place]  [id number] Phone: [phone]; Fax: [phone]

## 2014-08-20 ENCOUNTER — Telehealth: Payer: Self-pay

## 2014-08-20 DIAGNOSIS — E0590 Thyrotoxicosis, unspecified without thyrotoxic crisis or storm: Secondary | ICD-10-CM

## 2014-08-20 NOTE — Telephone Encounter (Signed)
-----   Message from Belva Crome, MD sent at 08/19/2014  5:25 PM EDT ----- The screening thyroid test suggests that the thyroid is overactive. He needs to have a free T4, T3, and total T4. May need to see an endocrinologist. Thyroid numbers are likely abnormal due to amiodarone.

## 2014-08-20 NOTE — Telephone Encounter (Signed)
Pt made aware of results no questions at this time.  Pt lab work ordered and lab appt scheduled for 4/25.

## 2014-08-25 ENCOUNTER — Other Ambulatory Visit (INDEPENDENT_AMBULATORY_CARE_PROVIDER_SITE_OTHER): Payer: Medicare PPO | Admitting: *Deleted

## 2014-08-25 ENCOUNTER — Other Ambulatory Visit: Payer: Self-pay

## 2014-08-25 DIAGNOSIS — E0590 Thyrotoxicosis, unspecified without thyrotoxic crisis or storm: Secondary | ICD-10-CM

## 2014-08-25 LAB — T4, FREE: FREE T4: 1.93 ng/dL — AB (ref 0.60–1.60)

## 2014-08-25 LAB — T3, FREE: T3 FREE: 3.7 pg/mL (ref 2.3–4.2)

## 2014-08-25 MED ORDER — AMIODARONE HCL 200 MG PO TABS: 100.0000 mg | ORAL_TABLET | Freq: Every day | ORAL | Status: DC

## 2014-08-28 ENCOUNTER — Telehealth: Payer: Self-pay

## 2014-08-28 ENCOUNTER — Other Ambulatory Visit: Payer: Self-pay | Admitting: Interventional Cardiology

## 2014-08-28 DIAGNOSIS — Z79899 Other long term (current) drug therapy: Secondary | ICD-10-CM

## 2014-08-28 NOTE — Telephone Encounter (Signed)
Pt aware of labs results. Needs repeat TSH and T4 in 1 month Lab appt scheduled for 5/25 Pt verbalized understanding.

## 2014-08-28 NOTE — Telephone Encounter (Signed)
-----   Message from Belva Crome, MD sent at 08/25/2014  5:40 PM EDT ----- Needs repeat TSH and T4 in 1 month.

## 2014-09-10 ENCOUNTER — Ambulatory Visit (INDEPENDENT_AMBULATORY_CARE_PROVIDER_SITE_OTHER): Payer: Medicare PPO | Admitting: *Deleted

## 2014-09-10 DIAGNOSIS — I4891 Unspecified atrial fibrillation: Secondary | ICD-10-CM

## 2014-09-10 LAB — POCT INR: INR: 3

## 2014-09-24 ENCOUNTER — Other Ambulatory Visit (INDEPENDENT_AMBULATORY_CARE_PROVIDER_SITE_OTHER): Payer: Medicare PPO | Admitting: *Deleted

## 2014-09-24 DIAGNOSIS — Z79899 Other long term (current) drug therapy: Secondary | ICD-10-CM | POA: Diagnosis not present

## 2014-09-24 LAB — T4, FREE: Free T4: 1.94 ng/dL — ABNORMAL HIGH (ref 0.60–1.60)

## 2014-09-24 LAB — TSH: TSH: 0.01 u[IU]/mL — AB (ref 0.35–4.50)

## 2014-09-24 NOTE — Addendum Note (Signed)
Addended by: Eulis Foster on: 09/24/2014 10:30 AM   Modules accepted: Orders

## 2014-10-02 ENCOUNTER — Telehealth: Payer: Self-pay

## 2014-10-02 DIAGNOSIS — Z79899 Other long term (current) drug therapy: Secondary | ICD-10-CM

## 2014-10-02 NOTE — Telephone Encounter (Signed)
-----   Message from Belva Crome, MD sent at 10/01/2014  1:08 PM EDT ----- Thyroid is overactive and should be dealt with by PCP. We have to stop amiodarone. Copy to [name]. Recheck TSH in 6 weeks

## 2014-10-02 NOTE — Telephone Encounter (Signed)
Pt aware of lab results and Dr.Smith's recommendations. Thyroid is overactive and should be dealt with by PCP. We have to stop amiodarone. Copy to [name]. Recheck TSH in 6 weeks

## 2014-10-22 ENCOUNTER — Ambulatory Visit (INDEPENDENT_AMBULATORY_CARE_PROVIDER_SITE_OTHER): Payer: Medicare PPO | Admitting: *Deleted

## 2014-10-22 DIAGNOSIS — I4891 Unspecified atrial fibrillation: Secondary | ICD-10-CM | POA: Diagnosis not present

## 2014-10-22 LAB — POCT INR: INR: 2.5

## 2014-11-05 ENCOUNTER — Ambulatory Visit (INDEPENDENT_AMBULATORY_CARE_PROVIDER_SITE_OTHER): Payer: Medicare PPO | Admitting: *Deleted

## 2014-11-05 DIAGNOSIS — I4891 Unspecified atrial fibrillation: Secondary | ICD-10-CM | POA: Diagnosis not present

## 2014-11-05 LAB — POCT INR: INR: 2.3

## 2014-11-14 ENCOUNTER — Telehealth: Payer: Self-pay

## 2014-11-14 ENCOUNTER — Other Ambulatory Visit (INDEPENDENT_AMBULATORY_CARE_PROVIDER_SITE_OTHER): Payer: Medicare PPO | Admitting: *Deleted

## 2014-11-14 DIAGNOSIS — R002 Palpitations: Secondary | ICD-10-CM

## 2014-11-14 DIAGNOSIS — Z79899 Other long term (current) drug therapy: Secondary | ICD-10-CM | POA: Diagnosis not present

## 2014-11-14 LAB — TSH: TSH: 0.03 u[IU]/mL — ABNORMAL LOW (ref 0.35–4.50)

## 2014-11-14 NOTE — Telephone Encounter (Signed)
Follow Up  Pt wife called back

## 2014-11-14 NOTE — Addendum Note (Signed)
Addended by: Harland German A on: 11/14/2014 01:48 PM   Modules accepted: Orders

## 2014-11-14 NOTE — Telephone Encounter (Signed)
Informed patient of Dr. Theodosia Blender recommendation to wear heart monitor.  Spoke with patient's grandson, per patient request. Both agree to monitor and it is ordered for scheduling. Informed them FU will be determined by [name] pending monitor results.

## 2014-11-14 NOTE — Telephone Encounter (Signed)
Anderson Malta, RN called from Dr. Carlyle Lipa office to report bradycardia of 50 bpm.  Reviewed information with Anderson Malta and NP. Informed NP that [name] is aware of hyperthyroidism and is the doctor who stopped patient's amiodarone early June.  Informed her that [name] is aware of patient's HR and it does not appear that 50 bpm is abnormal for the patient. The NP st that the patient is asymptomatic. He originally scheduled OV with them due to diarrhea, and when they checked VS they called [facility]. Although the NP reports that the patient is currently asymptomatic, she does st that the patient reports unintentional weight loss and he st he had a bout of palpitations a couple weeks ago. He could not remember how long.  The NP states that the patient is stable if HR is normal for him.  Per Dr. Radford Pax, event monitor to be ordered for patient.   Left message for patient to call back.

## 2014-11-20 ENCOUNTER — Other Ambulatory Visit: Payer: Self-pay | Admitting: Interventional Cardiology

## 2014-11-23 NOTE — Telephone Encounter (Signed)
Note that amio was stopped due to hyperthyroidism. Await monitor results to determine if PAF.

## 2014-11-24 ENCOUNTER — Telehealth: Payer: Self-pay

## 2014-11-24 NOTE — Telephone Encounter (Signed)
-----   Message from Belva Crome, MD sent at 11/23/2014  3:59 PM EDT ----- Let him know that thyroid still overative and to f/u with [name] as instructed.

## 2014-11-24 NOTE — Telephone Encounter (Signed)
Pt aware of lab results. Let him know that thyroid still overative and to f/u with [name] as instructed. Labs fwd to pcp. Pt verbalized understanding.

## 2014-11-26 ENCOUNTER — Other Ambulatory Visit: Payer: Self-pay

## 2014-11-26 ENCOUNTER — Ambulatory Visit (INDEPENDENT_AMBULATORY_CARE_PROVIDER_SITE_OTHER): Payer: Medicare PPO | Admitting: Pharmacist

## 2014-11-26 DIAGNOSIS — I4891 Unspecified atrial fibrillation: Secondary | ICD-10-CM | POA: Diagnosis not present

## 2014-11-26 LAB — POCT INR: INR: 1.8

## 2014-12-03 ENCOUNTER — Ambulatory Visit (INDEPENDENT_AMBULATORY_CARE_PROVIDER_SITE_OTHER): Payer: Medicare PPO

## 2014-12-03 DIAGNOSIS — R002 Palpitations: Secondary | ICD-10-CM

## 2014-12-10 ENCOUNTER — Ambulatory Visit (INDEPENDENT_AMBULATORY_CARE_PROVIDER_SITE_OTHER): Payer: Medicare PPO | Admitting: *Deleted

## 2014-12-10 DIAGNOSIS — I4891 Unspecified atrial fibrillation: Secondary | ICD-10-CM

## 2014-12-10 LAB — POCT INR: INR: 1.8

## 2014-12-24 ENCOUNTER — Ambulatory Visit (INDEPENDENT_AMBULATORY_CARE_PROVIDER_SITE_OTHER): Payer: Medicare PPO | Admitting: *Deleted

## 2014-12-24 DIAGNOSIS — I4891 Unspecified atrial fibrillation: Secondary | ICD-10-CM | POA: Diagnosis not present

## 2014-12-24 LAB — POCT INR: INR: 2

## 2015-01-07 ENCOUNTER — Ambulatory Visit (INDEPENDENT_AMBULATORY_CARE_PROVIDER_SITE_OTHER): Payer: Medicare PPO | Admitting: Pharmacist

## 2015-01-07 DIAGNOSIS — I4891 Unspecified atrial fibrillation: Secondary | ICD-10-CM

## 2015-01-07 LAB — POCT INR: INR: 1.8

## 2015-01-13 ENCOUNTER — Telehealth: Payer: Self-pay | Admitting: Interventional Cardiology

## 2015-01-13 NOTE — Telephone Encounter (Signed)
-----   Message from Belva Crome, MD sent at 01/10/2015  4:33 PM EDT ----- No atrial fibrillation off amiodarone. Compliance with wearing monitor was low however.

## 2015-01-13 NOTE — Telephone Encounter (Signed)
Pt gave permission for me to talk with his grandson Marcello Moores about his monitor results. No atrial fibrillation off amiodarone. Compliance with wearing monitor was low however. Pt will keep his f/u with Dr.Smith on 10/20.

## 2015-01-13 NOTE — Telephone Encounter (Signed)
New Message   Pt grandson Stacie Acres is calling to get results for pt

## 2015-01-28 ENCOUNTER — Ambulatory Visit (INDEPENDENT_AMBULATORY_CARE_PROVIDER_SITE_OTHER): Payer: Medicare PPO | Admitting: Pharmacist

## 2015-01-28 DIAGNOSIS — I4891 Unspecified atrial fibrillation: Secondary | ICD-10-CM | POA: Diagnosis not present

## 2015-01-28 LAB — POCT INR: INR: 2

## 2015-01-31 ENCOUNTER — Other Ambulatory Visit: Payer: Self-pay | Admitting: Interventional Cardiology

## 2015-02-18 DIAGNOSIS — I25119 Atherosclerotic heart disease of native coronary artery with unspecified angina pectoris: Secondary | ICD-10-CM | POA: Insufficient documentation

## 2015-02-19 ENCOUNTER — Encounter: Payer: Self-pay | Admitting: Interventional Cardiology

## 2015-02-19 ENCOUNTER — Ambulatory Visit (INDEPENDENT_AMBULATORY_CARE_PROVIDER_SITE_OTHER): Payer: Medicare PPO

## 2015-02-19 ENCOUNTER — Ambulatory Visit (INDEPENDENT_AMBULATORY_CARE_PROVIDER_SITE_OTHER): Payer: Medicare PPO | Admitting: Interventional Cardiology

## 2015-02-19 VITALS — BP 168/84 | HR 47 | Ht 76.0 in | Wt 184.8 lb

## 2015-02-19 DIAGNOSIS — Z7901 Long term (current) use of anticoagulants: Secondary | ICD-10-CM | POA: Diagnosis not present

## 2015-02-19 DIAGNOSIS — I480 Paroxysmal atrial fibrillation: Secondary | ICD-10-CM

## 2015-02-19 DIAGNOSIS — Z09 Encounter for follow-up examination after completed treatment for conditions other than malignant neoplasm: Secondary | ICD-10-CM

## 2015-02-19 DIAGNOSIS — I10 Essential (primary) hypertension: Secondary | ICD-10-CM

## 2015-02-19 DIAGNOSIS — I4891 Unspecified atrial fibrillation: Secondary | ICD-10-CM

## 2015-02-19 DIAGNOSIS — I25119 Atherosclerotic heart disease of native coronary artery with unspecified angina pectoris: Secondary | ICD-10-CM

## 2015-02-19 DIAGNOSIS — Z9229 Personal history of other drug therapy: Secondary | ICD-10-CM

## 2015-02-19 LAB — POCT INR: INR: 1.9

## 2015-02-19 NOTE — Progress Notes (Signed)
Cardiology Office Note   Date:  02/19/2015   ID:  Donald Rasmussen, DOB Aug 22, 1932, MRN [id number]  PCP:  Mathews Argyle, MD  Cardiologist:  Sinclair Grooms, MD   Chief Complaint  Patient presents with  . Atrial Fibrillation      History of Present Illness: Donald Rasmussen is a 81 y.o. male who presents for paroxysmal atrial fibrillation, hyperthyroidism on amiodarone, hypertension, coronary artery disease with bare-metal stent RCA 1999, hyperlipidemia, and chronic kidney disease stage III.  He has occasional palpitations. No significant episodes of rapid heart rate, dyspnea, lightheadedness, or near syncope. Does have occasional skipped beat with activity. It does not persist. In fact activity seems to help with radical way.  Amiodarone was stopped in the summertime because of thyrotoxicosis. A 30 day continuous monitor, with date of only 11 of 30 days did not demonstrate any evidence of atrial fibrillation approximately 8 weeks off of amiodarone. He was only on 100 mg per day.    Past Medical History  Diagnosis Date  . Coronary artery disease     BMS 1999  . Hypertension   . Hyperlipidemia   . Gout   . Impaired fasting blood sugar   . Cancer [facility])     prostate; Dr. Amalia Hailey  . Edentulous   . Chronic low back pain     2000 lumbosacral spine degenerative change. Possible calculus  . Mucous cyst of tonsil   . Complication of anesthesia   . Chronic kidney disease     Stg III kidney disease; GRF 81    Past Surgical History  Procedure Laterality Date  . Sebaceous cyst back    . Hemorrhoid surgery    . Retropubic prostatectomy  1997  . Cardiac catheterization      stent   . Cardioversion  03/07/2012    Procedure: CARDIOVERSION;  Surgeon: Sinclair Grooms, MD;  Location: [facility];  Service: Cardiovascular;  Laterality: N/A;     Current Outpatient Prescriptions  Medication Sig Dispense Refill  . acetaminophen (TYLENOL) 500 MG tablet Take 500 mg by mouth  every 6 (six) hours as needed (pain).     Marland Kitchen allopurinol (ZYLOPRIM) 300 MG tablet Take 300 mg by mouth daily.    Marland Kitchen amLODipine (NORVASC) 5 MG tablet Take 1 tablet (5 mg total) by mouth daily. 90 tablet 3  . atorvastatin (LIPITOR) 20 MG tablet TAKE ONE TABLET BY MOUTH ONCE DAILY 90 tablet 3  . cholecalciferol (VITAMIN D) 1000 UNITS tablet Take 2,000 Units by mouth daily.    Marland Kitchen EPINEPHrine (EPIPEN JR) 0.15 MG/0.3ML injection Inject 0.15 mg into the muscle as needed for anaphylaxis.     . fish oil-omega-3 fatty acids 1000 MG capsule Take 1,200 mg by mouth daily.    . metoprolol (LOPRESSOR) 25 MG tablet Take 1 tablet (25 mg total) by mouth 2 (two) times daily. 60 tablet 11  . quinapril (ACCUPRIL) 20 MG tablet Take 1 tablet (20 mg total) by mouth daily. 60 tablet 11  . warfarin (COUMADIN) 1 MG tablet TAKE ONE TABLET BY MOUTH DAILY AS DIRECTED 180 tablet 1   No current facility-administered medications for this visit.    Allergies:   Bee venom    Social History:  The patient  reports that he has quit smoking. His smokeless tobacco use includes Chew. He reports that he drinks alcohol. He reports that he does not use illicit drugs.   Family History:  The patient's family history includes Heart attack  in his father and mother; Hyperlipidemia in his mother.    ROS:  Please see the history of present illness.   Otherwise, review of systems are positive for anxiety about the possibility of his heart going out of rhythm again and requiring electrical cardioversion. Some difficulty with balance and anxiety..   All other systems are reviewed and negative.    PHYSICAL EXAM: VS:  BP 168/84 mmHg  Pulse 47  Ht 6\' 4"  (1.93 m)  Wt 83.825 kg (184 lb 12.8 oz)  BMI 22.50 kg/m2 , BMI Body mass index is 22.5 kg/(m^2). GEN: Well nourished, well developed, in no acute distress HEENT: normal Neck: no JVD, carotid bruits, or masses Cardiac: RRR.  There is no murmur, rub, or gallop. There is no edema. Respiratory:   clear to auscultation bilaterally, normal work of breathing. GI: soft, nontender, nondistended, + BS MS: no deformity or atrophy Skin: warm and dry, no rash Neuro:  Strength and sensation are intact Psych: euthymic mood, full affect   EKG:  EKG is ordered today. The ekg reveals sinus bradycardia, biatrial abnormality, normal PR interval. Left axis deviation.   Recent Labs: 08/19/2014: ALT 11 11/14/2014: TSH 0.03*    Lipid Panel No results found for: CHOL, TRIG, HDL, CHOLHDL, VLDL, LDLCALC, LDLDIRECT    Wt Readings from Last 3 Encounters:  02/19/15 83.825 kg (184 lb 12.8 oz)  08/19/14 84.823 kg (187 lb)  02/18/14 87.272 kg (192 lb 6.4 oz)      Other studies Reviewed: Additional studies/ records that were reviewed today include: Review data concerning thyroid evaluation and management.. The findings include still mildly hyperthyroid in August..    ASSESSMENT AND PLAN:  1. Paroxysmal atrial fibrillation (HCC) No clinical recurrences - EKG 12-Lead  2. Long term current use of anticoagulant therapy No bleeding on Coumadin .  3. Essential hypertension mildly elevated systolic pressure today - EKG 12-Lead  4. Coronary artery disease involving native coronary artery of native heart with angina pectoris (HCC) Asymptomatic - EKG 12-Lead  5. History of amiodarone therapy Amiodarone therapy has been discontinued because of thyrotoxicosis.    Current medicines are reviewed at length with the patient today.  The patient has the following concerns regarding medicines: Amiodarone and whether he can restart the medication. We had a long conversation concerning the potential complications related to amiodarone therapy. Advised him that we should stay off this medication until or unless atrial fib recurrence. He is protected from stroke by anticoagulation with Coumadin..  The following changes/actions have been instituted:    Amiodarone should not be used again in the  future.  Continued clinically monitored for recurrences of atrial fib. Discussed the pros and cons of medical therapy for arrhythmia suppression. Given significant bradycardia, he would be at risk for pacemaker therapy.  Labs/ tests ordered today include:  No orders of the defined types were placed in this encounter.     Disposition:   FU with HS in 6 months  Signed, Sinclair Grooms, MD  02/19/2015 11:48 AM    [address], [place], [place]  [address] Phone: [phone]; Fax: [phone]

## 2015-02-19 NOTE — Patient Instructions (Signed)
Medication Instructions:  Your physician recommends that you continue on your current medications as directed. Please refer to the Current Medication list given to you today.   Labwork: None orderede  Testing/Procedures: None ordered  Follow-Up: Your physician wants you to follow-up in: 6 months with Dr.Smith You will receive a reminder letter in the mail two months in advance. If you don't receive a letter, please call our office to schedule the follow-up appointment.   Any Other Special Instructions Will Be Listed Below (If Applicable). Stay off Amiodarone. Call the office if you are having any prolonged Atrial Fibrillation

## 2015-03-06 ENCOUNTER — Other Ambulatory Visit: Payer: Self-pay | Admitting: Interventional Cardiology

## 2015-03-12 ENCOUNTER — Ambulatory Visit (INDEPENDENT_AMBULATORY_CARE_PROVIDER_SITE_OTHER): Payer: Medicare PPO | Admitting: *Deleted

## 2015-03-12 DIAGNOSIS — I4891 Unspecified atrial fibrillation: Secondary | ICD-10-CM

## 2015-03-12 LAB — POCT INR: INR: 1.9

## 2015-04-02 ENCOUNTER — Ambulatory Visit (INDEPENDENT_AMBULATORY_CARE_PROVIDER_SITE_OTHER): Payer: Medicare PPO | Admitting: *Deleted

## 2015-04-02 DIAGNOSIS — I4891 Unspecified atrial fibrillation: Secondary | ICD-10-CM

## 2015-04-02 LAB — POCT INR: INR: 2.1

## 2015-04-04 ENCOUNTER — Other Ambulatory Visit: Payer: Self-pay | Admitting: Interventional Cardiology

## 2015-04-08 ENCOUNTER — Other Ambulatory Visit: Payer: Self-pay | Admitting: Interventional Cardiology

## 2015-04-09 ENCOUNTER — Telehealth: Payer: Self-pay | Admitting: *Deleted

## 2015-04-09 NOTE — Telephone Encounter (Signed)
Returned patient's call about Quinapril and Amlodipine being ready at [facility] on [facility]).

## 2015-04-30 ENCOUNTER — Ambulatory Visit (INDEPENDENT_AMBULATORY_CARE_PROVIDER_SITE_OTHER): Payer: Medicare PPO | Admitting: *Deleted

## 2015-04-30 DIAGNOSIS — I4891 Unspecified atrial fibrillation: Secondary | ICD-10-CM

## 2015-04-30 LAB — POCT INR: INR: 2.9

## 2015-05-07 ENCOUNTER — Other Ambulatory Visit: Payer: Self-pay | Admitting: Interventional Cardiology

## 2015-05-07 MED ORDER — ATORVASTATIN CALCIUM 20 MG PO TABS: 20.0000 mg | ORAL_TABLET | Freq: Every day | ORAL | Status: AC

## 2015-05-07 MED ORDER — AMLODIPINE BESYLATE 5 MG PO TABS: 5.0000 mg | ORAL_TABLET | Freq: Every day | ORAL | Status: DC

## 2015-05-07 MED ORDER — QUINAPRIL HCL 20 MG PO TABS: 20.0000 mg | ORAL_TABLET | Freq: Every day | ORAL | Status: DC

## 2015-05-11 ENCOUNTER — Other Ambulatory Visit: Payer: Self-pay | Admitting: Pharmacist

## 2015-05-11 DIAGNOSIS — Z7901 Long term (current) use of anticoagulants: Secondary | ICD-10-CM

## 2015-05-11 DIAGNOSIS — I480 Paroxysmal atrial fibrillation: Secondary | ICD-10-CM

## 2015-05-11 MED ORDER — METOPROLOL TARTRATE 25 MG PO TABS: 25.0000 mg | ORAL_TABLET | Freq: Two times a day (BID) | ORAL | Status: DC

## 2015-05-11 MED ORDER — WARFARIN SODIUM 1 MG PO TABS: 1.0000 mg | ORAL_TABLET | Freq: Every day | ORAL | Status: DC

## 2015-05-11 NOTE — Telephone Encounter (Signed)
Fax from [place] for refill on warfarin and metoprolol (90 days supply). Refill for warfarin sent. Please send refill for metoprolol if appropriate. Thank you!

## 2015-05-28 ENCOUNTER — Telehealth: Payer: Self-pay | Admitting: *Deleted

## 2015-05-28 ENCOUNTER — Ambulatory Visit (INDEPENDENT_AMBULATORY_CARE_PROVIDER_SITE_OTHER): Payer: Medicare HMO | Admitting: *Deleted

## 2015-05-28 DIAGNOSIS — I4891 Unspecified atrial fibrillation: Secondary | ICD-10-CM | POA: Diagnosis not present

## 2015-05-28 LAB — POCT INR: INR: 2.6

## 2015-05-28 NOTE — Telephone Encounter (Signed)
On visit to coumadin clinic today asked to get refill on his Nitroglycerin tablets . Spoke with pt and he states was given this after surgery 16 years ago and states he never had to take them This information given to [facility] [employer] nurse and she states she will talk with Dr Tamala Julian regarding this  and will call pt if he reorders Nitroglycerin  For him.

## 2015-06-04 ENCOUNTER — Telehealth: Payer: Self-pay

## 2015-06-04 MED ORDER — NITROGLYCERIN 0.4 MG SL SUBL: 0.4000 mg | SUBLINGUAL_TABLET | SUBLINGUAL | Status: AC | PRN

## 2015-06-04 NOTE — Telephone Encounter (Signed)
Spoke with pt after speaking a message from [facility] that pt would like a new Rx for Nitro-glycerin. Pt sts that he is doing well with no complaints.Pt sts that he has never had to use Nitro, but would like to have it in hand if he ever needed it. Spoke with Dr.Smith, ok for pt to have Rx. Rx sent to pt pharmacy. Pt verbalized understanding.

## 2015-06-15 DIAGNOSIS — E063 Autoimmune thyroiditis: Secondary | ICD-10-CM | POA: Diagnosis not present

## 2015-06-25 ENCOUNTER — Ambulatory Visit (INDEPENDENT_AMBULATORY_CARE_PROVIDER_SITE_OTHER): Payer: Commercial Managed Care - HMO | Admitting: *Deleted

## 2015-06-25 DIAGNOSIS — I4891 Unspecified atrial fibrillation: Secondary | ICD-10-CM

## 2015-06-25 LAB — POCT INR: INR: 3.2

## 2015-07-16 ENCOUNTER — Ambulatory Visit (INDEPENDENT_AMBULATORY_CARE_PROVIDER_SITE_OTHER): Payer: Commercial Managed Care - HMO | Admitting: Pharmacist

## 2015-07-16 DIAGNOSIS — I4891 Unspecified atrial fibrillation: Secondary | ICD-10-CM | POA: Diagnosis not present

## 2015-07-16 LAB — POCT INR: INR: 3.1

## 2015-08-10 ENCOUNTER — Ambulatory Visit (INDEPENDENT_AMBULATORY_CARE_PROVIDER_SITE_OTHER): Payer: Commercial Managed Care - HMO | Admitting: Pharmacist

## 2015-08-10 DIAGNOSIS — I4891 Unspecified atrial fibrillation: Secondary | ICD-10-CM

## 2015-08-10 LAB — POCT INR: INR: 3.2

## 2015-08-12 DIAGNOSIS — Z125 Encounter for screening for malignant neoplasm of prostate: Secondary | ICD-10-CM | POA: Diagnosis not present

## 2015-08-12 DIAGNOSIS — Z1389 Encounter for screening for other disorder: Secondary | ICD-10-CM | POA: Diagnosis not present

## 2015-08-12 DIAGNOSIS — Z0000 Encounter for general adult medical examination without abnormal findings: Secondary | ICD-10-CM | POA: Diagnosis not present

## 2015-08-12 DIAGNOSIS — Z79899 Other long term (current) drug therapy: Secondary | ICD-10-CM | POA: Diagnosis not present

## 2015-08-12 DIAGNOSIS — E7800 Pure hypercholesterolemia, unspecified: Secondary | ICD-10-CM | POA: Diagnosis not present

## 2015-08-12 DIAGNOSIS — N183 Chronic kidney disease, stage 3 (moderate): Secondary | ICD-10-CM | POA: Diagnosis not present

## 2015-08-12 DIAGNOSIS — I481 Persistent atrial fibrillation: Secondary | ICD-10-CM | POA: Diagnosis not present

## 2015-08-12 DIAGNOSIS — I129 Hypertensive chronic kidney disease with stage 1 through stage 4 chronic kidney disease, or unspecified chronic kidney disease: Secondary | ICD-10-CM | POA: Diagnosis not present

## 2015-08-13 ENCOUNTER — Other Ambulatory Visit: Payer: Self-pay | Admitting: Interventional Cardiology

## 2015-08-31 ENCOUNTER — Ambulatory Visit (INDEPENDENT_AMBULATORY_CARE_PROVIDER_SITE_OTHER): Payer: Commercial Managed Care - HMO | Admitting: *Deleted

## 2015-08-31 DIAGNOSIS — I4891 Unspecified atrial fibrillation: Secondary | ICD-10-CM

## 2015-08-31 LAB — POCT INR: INR: 2.1

## 2015-09-30 ENCOUNTER — Ambulatory Visit (INDEPENDENT_AMBULATORY_CARE_PROVIDER_SITE_OTHER): Payer: Commercial Managed Care - HMO | Admitting: *Deleted

## 2015-09-30 DIAGNOSIS — Z7901 Long term (current) use of anticoagulants: Secondary | ICD-10-CM | POA: Diagnosis not present

## 2015-09-30 DIAGNOSIS — I480 Paroxysmal atrial fibrillation: Secondary | ICD-10-CM

## 2015-09-30 DIAGNOSIS — I4891 Unspecified atrial fibrillation: Secondary | ICD-10-CM | POA: Diagnosis not present

## 2015-09-30 LAB — POCT INR: INR: 1.7

## 2015-09-30 MED ORDER — WARFARIN SODIUM 1 MG PO TABS: 1.0000 mg | ORAL_TABLET | ORAL | Status: DC

## 2015-10-06 ENCOUNTER — Telehealth: Payer: Self-pay

## 2015-10-06 DIAGNOSIS — Z7901 Long term (current) use of anticoagulants: Secondary | ICD-10-CM

## 2015-10-06 DIAGNOSIS — I480 Paroxysmal atrial fibrillation: Secondary | ICD-10-CM

## 2015-10-06 MED ORDER — WARFARIN SODIUM 1 MG PO TABS: 1.0000 mg | ORAL_TABLET | ORAL | Status: DC

## 2015-10-06 NOTE — Telephone Encounter (Signed)
Returned call to pt, pt inquiring about Warfarin rx being sent to [place].  Advised pt rx for Warfarin 1mg  tablets was sent to [facility] mail order pharmacy on 09/30/15 for 150 tablets with 1 refill.  Received receipt confirming rx received by [facility].  Pt states he is totally out of Warfarin and has not received refill in mail.  Called [facility] they state rx is in process of being filled last filled in April, but for only 90 tablets given sig of 1 tablet QD.  Advised pt takes more than 1 tablet daily and this is why the 90 tablet rx in April needs to be refilled at the end of May.  New rx is for increased quantity, they are aware and are in the process of filling.  Called pt back advised of above, will sent in 2 weeks supply of Warfarin to [facility] [address] to fill locally until mail order rx is received.  Pt verbalized understanding [facility]

## 2015-10-14 ENCOUNTER — Ambulatory Visit (INDEPENDENT_AMBULATORY_CARE_PROVIDER_SITE_OTHER): Payer: Commercial Managed Care - HMO | Admitting: *Deleted

## 2015-10-14 DIAGNOSIS — I4891 Unspecified atrial fibrillation: Secondary | ICD-10-CM

## 2015-10-14 LAB — POCT INR: INR: 2.7

## 2015-10-19 ENCOUNTER — Telehealth: Payer: Self-pay | Admitting: Interventional Cardiology

## 2015-10-19 DIAGNOSIS — R079 Chest pain, unspecified: Secondary | ICD-10-CM

## 2015-10-19 NOTE — Telephone Encounter (Signed)
New Message:  Pt's Grandson called in stating that the has been experiencing some chest discomfort (intermittantly) and gassy feeling for about a week. The pt took a nitro to subside the pressure and it helped. The Grandson would like to know if he needs to be evaluated. Please f/u with him

## 2015-10-19 NOTE — Telephone Encounter (Signed)
HOME NUMBER  BUSY  .Adonis Housekeeper

## 2015-10-19 NOTE — Telephone Encounter (Signed)
SPOKE WITH  PT  FEELS  FINE  AT THIS TIME.  DID  HAVE  TO  TAKE  NTG  LAST WEEK  NOTED  ,NO DIFFERENCE  IN RELIEF , PAIN GOES  AWAY ON  ITS OWN.DISCUSSED  PAST HISTORY  HAD  HEART ATTACK IN  1999 WITH  STENT  PER PT NO PROBLEMS  SINCE THEN   NOTES  THIS  PAIN IS  NOT THE  SAME AS IN  99.WILL FORWARD  TO DR  Tamala Julian   FOR REVIEW .Adonis Housekeeper

## 2015-10-20 NOTE — Telephone Encounter (Signed)
Needs Lexiscan Myoview nuclear stress

## 2015-10-22 NOTE — Telephone Encounter (Signed)
LM TO CALL BACK ./CY 

## 2015-10-22 NOTE — Telephone Encounter (Signed)
PT AWARE OF  RECOMMENDATIONS  AND IS  WILLING  TO PROCEED .Adonis Housekeeper

## 2015-10-22 NOTE — Telephone Encounter (Signed)
Patient returning your call. He can be reached at [phone]. Thanks, MI

## 2015-11-04 ENCOUNTER — Telehealth (HOSPITAL_COMMUNITY): Payer: Self-pay | Admitting: *Deleted

## 2015-11-04 ENCOUNTER — Ambulatory Visit (INDEPENDENT_AMBULATORY_CARE_PROVIDER_SITE_OTHER): Payer: Commercial Managed Care - HMO | Admitting: Pharmacist

## 2015-11-04 DIAGNOSIS — I4891 Unspecified atrial fibrillation: Secondary | ICD-10-CM

## 2015-11-04 LAB — POCT INR: INR: 2.4

## 2015-11-04 NOTE — Telephone Encounter (Signed)
Attempted to call regarding upcoming nuclear appointment- no answer. Hubbard Robinson, RN

## 2015-11-09 ENCOUNTER — Ambulatory Visit (HOSPITAL_COMMUNITY): Payer: Commercial Managed Care - HMO | Attending: Cardiology

## 2015-11-09 DIAGNOSIS — R079 Chest pain, unspecified: Secondary | ICD-10-CM | POA: Diagnosis not present

## 2015-11-09 DIAGNOSIS — I10 Essential (primary) hypertension: Secondary | ICD-10-CM | POA: Insufficient documentation

## 2015-11-09 LAB — MYOCARDIAL PERFUSION IMAGING
CHL CUP NUCLEAR SRS: 7
CHL CUP RESTING HR STRESS: 65 {beats}/min
LV sys vol: 73 mL
LVDIAVOL: 137 mL (ref 62–150)
NUC STRESS TID: 1.03
Peak HR: 77 {beats}/min
RATE: 0.28
SDS: 2
SSS: 8

## 2015-11-09 MED ORDER — TECHNETIUM TC 99M TETROFOSMIN IV KIT
10.7000 | PACK | Freq: Once | INTRAVENOUS | Status: AC | PRN
  Administered 2015-11-09: 11 via INTRAVENOUS
  Filled 2015-11-09: qty 11

## 2015-11-09 MED ORDER — REGADENOSON 0.4 MG/5ML IV SOLN
0.4000 mg | Freq: Once | INTRAVENOUS | Status: AC
  Administered 2015-11-09: 0.4 mg via INTRAVENOUS

## 2015-11-09 MED ORDER — TECHNETIUM TC 99M TETROFOSMIN IV KIT
32.8000 | PACK | Freq: Once | INTRAVENOUS | Status: AC | PRN
  Administered 2015-11-09: 32.8 via INTRAVENOUS
  Filled 2015-11-09: qty 33

## 2015-11-18 ENCOUNTER — Other Ambulatory Visit: Payer: Self-pay | Admitting: Interventional Cardiology

## 2015-11-19 ENCOUNTER — Ambulatory Visit: Payer: Commercial Managed Care - HMO | Admitting: Interventional Cardiology

## 2015-11-23 ENCOUNTER — Other Ambulatory Visit: Payer: Self-pay | Admitting: *Deleted

## 2015-11-23 DIAGNOSIS — Z7901 Long term (current) use of anticoagulants: Secondary | ICD-10-CM

## 2015-11-23 DIAGNOSIS — I480 Paroxysmal atrial fibrillation: Secondary | ICD-10-CM

## 2015-11-23 MED ORDER — WARFARIN SODIUM 1 MG PO TABS: 1.0000 mg | ORAL_TABLET | ORAL | 3 refills | Status: DC

## 2015-12-02 ENCOUNTER — Ambulatory Visit (INDEPENDENT_AMBULATORY_CARE_PROVIDER_SITE_OTHER): Payer: Commercial Managed Care - HMO | Admitting: *Deleted

## 2015-12-02 DIAGNOSIS — I4891 Unspecified atrial fibrillation: Secondary | ICD-10-CM | POA: Diagnosis not present

## 2015-12-02 LAB — POCT INR: INR: 2.3

## 2015-12-16 ENCOUNTER — Other Ambulatory Visit: Payer: Self-pay | Admitting: Interventional Cardiology

## 2016-01-01 DIAGNOSIS — E063 Autoimmune thyroiditis: Secondary | ICD-10-CM | POA: Diagnosis not present

## 2016-01-12 ENCOUNTER — Other Ambulatory Visit: Payer: Self-pay | Admitting: Interventional Cardiology

## 2016-01-15 ENCOUNTER — Ambulatory Visit (INDEPENDENT_AMBULATORY_CARE_PROVIDER_SITE_OTHER): Payer: Commercial Managed Care - HMO | Admitting: *Deleted

## 2016-01-15 ENCOUNTER — Other Ambulatory Visit: Payer: Self-pay | Admitting: Interventional Cardiology

## 2016-01-15 DIAGNOSIS — I4891 Unspecified atrial fibrillation: Secondary | ICD-10-CM

## 2016-01-15 DIAGNOSIS — Z7901 Long term (current) use of anticoagulants: Secondary | ICD-10-CM

## 2016-01-15 DIAGNOSIS — I480 Paroxysmal atrial fibrillation: Secondary | ICD-10-CM

## 2016-01-15 LAB — POCT INR: INR: 4.5

## 2016-01-29 ENCOUNTER — Encounter: Payer: Self-pay | Admitting: Interventional Cardiology

## 2016-01-29 ENCOUNTER — Ambulatory Visit (INDEPENDENT_AMBULATORY_CARE_PROVIDER_SITE_OTHER): Payer: Commercial Managed Care - HMO | Admitting: *Deleted

## 2016-01-29 ENCOUNTER — Ambulatory Visit (INDEPENDENT_AMBULATORY_CARE_PROVIDER_SITE_OTHER): Payer: Commercial Managed Care - HMO | Admitting: Interventional Cardiology

## 2016-01-29 VITALS — BP 140/70 | HR 66 | Ht 76.0 in | Wt 184.1 lb

## 2016-01-29 DIAGNOSIS — I482 Chronic atrial fibrillation, unspecified: Secondary | ICD-10-CM

## 2016-01-29 DIAGNOSIS — Z7901 Long term (current) use of anticoagulants: Secondary | ICD-10-CM | POA: Diagnosis not present

## 2016-01-29 DIAGNOSIS — I2510 Atherosclerotic heart disease of native coronary artery without angina pectoris: Secondary | ICD-10-CM | POA: Diagnosis not present

## 2016-01-29 DIAGNOSIS — Z09 Encounter for follow-up examination after completed treatment for conditions other than malignant neoplasm: Secondary | ICD-10-CM

## 2016-01-29 DIAGNOSIS — I4891 Unspecified atrial fibrillation: Secondary | ICD-10-CM | POA: Diagnosis not present

## 2016-01-29 DIAGNOSIS — I10 Essential (primary) hypertension: Secondary | ICD-10-CM

## 2016-01-29 DIAGNOSIS — Z9229 Personal history of other drug therapy: Secondary | ICD-10-CM

## 2016-01-29 LAB — POCT INR: INR: 2.1

## 2016-01-29 NOTE — Patient Instructions (Signed)

## 2016-01-29 NOTE — Progress Notes (Signed)
Cardiology Office Note    Date:  01/29/2016   ID:  Donald Rasmussen, DOB 10/16/1932, MRN [id number]  PCP:  Donald Argyle, MD  Cardiologist: Donald Grooms, MD   Chief Complaint  Patient presents with  . Atrial Fibrillation  . Coronary Artery Disease    History of Present Illness:  Donald Rasmussen is a 82 y.o. male paroxysmal atrial fibrillation, coronary artery disease with prior RCA stent, hypertension, chronic anticoagulation therapy, and stage III chronic kidney disease.  Donald Rasmussen is doing relatively well. He is now responsible for caring for his wife. He has not is physically active as he previously was because of needing to be close to her. He denies chest pain, orthopnea, PND, edema, bleeding, and transient neurological symptoms.  Past Medical History:  Diagnosis Date  . Cancer [facility])    prostate; Dr. Amalia Hailey  . Chronic kidney disease    Stg III kidney disease; GRF 55  . Chronic low back pain    2000 lumbosacral spine degenerative change. Possible calculus  . Complication of anesthesia   . Coronary artery disease    BMS 1999  . Edentulous   . Gout   . Hyperlipidemia   . Hypertension   . Impaired fasting blood sugar   . Mucous cyst of tonsil     Past Surgical History:  Procedure Laterality Date  . CARDIAC CATHETERIZATION     stent   . CARDIOVERSION  03/07/2012   Procedure: CARDIOVERSION;  Surgeon: Donald Grooms, MD;  Location: [place];  Service: Cardiovascular;  Laterality: N/A;  . HEMORRHOID SURGERY    . RETROPUBIC PROSTATECTOMY  1997  . sebaceous cyst back      Current Medications: Outpatient Medications Prior to Visit  Medication Sig Dispense Refill  . acetaminophen (TYLENOL) 500 MG tablet Take 500 mg by mouth every 6 (six) hours as needed (pain).     Marland Kitchen allopurinol (ZYLOPRIM) 300 MG tablet Take 300 mg by mouth daily.    Marland Kitchen atorvastatin (LIPITOR) 20 MG tablet Take 1 tablet (20 mg total) by mouth daily. 90 tablet 1  . cholecalciferol (VITAMIN  D) 1000 UNITS tablet Take 2,000 Units by mouth daily.    Marland Kitchen EPINEPHrine (EPIPEN JR) 0.15 MG/0.3ML injection Inject 0.15 mg into the muscle as needed for anaphylaxis.     . fish oil-omega-3 fatty acids 1000 MG capsule Take 1,200 mg by mouth daily.    . nitroGLYCERIN (NITROSTAT) 0.4 MG SL tablet Place 1 tablet (0.4 mg total) under the tongue every 5 (five) minutes as needed for chest pain. 25 tablet 3  . warfarin (COUMADIN) 1 MG tablet TAKE AS DIRECTED  BY  COUMADIN  CLINIC 150 tablet 1  . amLODipine (NORVASC) 5 MG tablet TAKE 1 TABLET EVERY DAY (Patient not taking: Reported on 01/29/2016) 90 tablet 3  . metoprolol tartrate (LOPRESSOR) 25 MG tablet TAKE 1 TABLET TWICE DAILY (Patient not taking: Reported on 01/29/2016) 180 tablet 3  . quinapril (ACCUPRIL) 20 MG tablet TAKE 1 TABLET EVERY DAY (Patient not taking: Reported on 01/29/2016) 90 tablet 3   No facility-administered medications prior to visit.      Allergies:   Bee venom   Social History   Social History  . Marital status: Married    Spouse name: N/A  . Number of children: N/A  . Years of education: N/A   Social History Main Topics  . Smoking status: Former Research scientist (life sciences)  . Smokeless tobacco: Current User    Types:  Chew  . Alcohol use 0.0 oz/week  . Drug use: No  . Sexual activity: Not Asked   Other Topics Concern  . None   Social History Narrative  . None     Family History:  The patient's family history includes Heart attack in his father and mother; Hyperlipidemia in his mother.   ROS:   Please see the history of present illness.    Decreased hearing  All other systems reviewed and are negative.   PHYSICAL EXAM:   VS:  BP 140/70   Pulse 66   Ht 6\' 4"  (1.93 m)   Wt 184 lb 1.9 oz (83.5 kg)   BMI 22.41 kg/m    GEN: Well nourished, well developed, in no acute distress  HEENT: normal  Neck: no JVD, carotid bruits, or masses Cardiac: IIRR; no murmurs, rubs, or gallops,no edema  Respiratory:  clear to auscultation  bilaterally, normal work of breathing GI: soft, nontender, nondistended, + BS MS: no deformity or atrophy  Skin: warm and dry, no rash Neuro:  Alert and Oriented x 3, Strength and sensation are intact Psych: euthymic mood, full affect  Wt Readings from Last 3 Encounters:  01/29/16 184 lb 1.9 oz (83.5 kg)  11/09/15 184 lb (83.5 kg)  02/19/15 184 lb 12.8 oz (83.8 kg)      Studies/Labs Reviewed:   EKG:  EKG  Atrial fibrillation with slow ventricular response at 66 bpm. Voltage criteria suggestive of left ventricular hypertrophy. Left anterior hemiblock. When compared to the prior tracing, atrial fibrillation is new.  Recent Labs: No results found for requested labs within last 8760 hours.   Lipid Panel No results found for: CHOL, TRIG, HDL, CHOLHDL, VLDL, LDLCALC, LDLDIRECT  Additional studies/ records that were reviewed today include:  None     ASSESSMENT:    1. Paroxysmal atrial fibrillation (HCC)   2. Coronary artery disease involving native coronary artery of native heart without angina pectoris   3. History of amiodarone therapy   4. Long term current use of anticoagulant therapy   5. Essential hypertension      PLAN:  In order of problems listed above:  1. Now in atrial fibrillation and suspect continuous/chronic atrial fib. No adjustments in the medical regimen is being made. 2. Asymptomatic with reference to angina. Call of chest pain. 3. He was previously on amiodarone. This medication is been discontinued. 4. No bleeding on Coumadin therapy. 5. 2 g sodium diarrhea force. Current blood pressures are adequate. Clinical follow-up in one year.    Medication Adjustments/Labs and Tests Ordered: Current medicines are reviewed at length with the patient today.  Concerns regarding medicines are outlined above.  Medication changes, Labs and Tests ordered today are listed in the Patient Instructions below. There are no Patient Instructions on file for this visit.    Signed, Donald Grooms, MD  01/29/2016 3:00 PM    [address], [place], [place]  [address] Phone: [phone]; Fax: [phone]

## 2016-02-10 DIAGNOSIS — Z79899 Other long term (current) drug therapy: Secondary | ICD-10-CM | POA: Diagnosis not present

## 2016-02-10 DIAGNOSIS — N183 Chronic kidney disease, stage 3 (moderate): Secondary | ICD-10-CM | POA: Diagnosis not present

## 2016-02-10 DIAGNOSIS — I129 Hypertensive chronic kidney disease with stage 1 through stage 4 chronic kidney disease, or unspecified chronic kidney disease: Secondary | ICD-10-CM | POA: Diagnosis not present

## 2016-02-22 ENCOUNTER — Ambulatory Visit (INDEPENDENT_AMBULATORY_CARE_PROVIDER_SITE_OTHER): Payer: Commercial Managed Care - HMO | Admitting: *Deleted

## 2016-02-22 DIAGNOSIS — I4891 Unspecified atrial fibrillation: Secondary | ICD-10-CM | POA: Diagnosis not present

## 2016-02-22 LAB — POCT INR: INR: 2

## 2016-03-21 ENCOUNTER — Ambulatory Visit (INDEPENDENT_AMBULATORY_CARE_PROVIDER_SITE_OTHER): Payer: Commercial Managed Care - HMO | Admitting: *Deleted

## 2016-03-21 DIAGNOSIS — I4891 Unspecified atrial fibrillation: Secondary | ICD-10-CM

## 2016-03-21 LAB — POCT INR: INR: 2.3

## 2016-04-16 ENCOUNTER — Other Ambulatory Visit: Payer: Self-pay | Admitting: Interventional Cardiology

## 2016-04-16 DIAGNOSIS — I480 Paroxysmal atrial fibrillation: Secondary | ICD-10-CM

## 2016-04-16 DIAGNOSIS — Z7901 Long term (current) use of anticoagulants: Secondary | ICD-10-CM

## 2016-05-03 ENCOUNTER — Ambulatory Visit (INDEPENDENT_AMBULATORY_CARE_PROVIDER_SITE_OTHER): Payer: Commercial Managed Care - HMO | Admitting: *Deleted

## 2016-05-03 DIAGNOSIS — I4891 Unspecified atrial fibrillation: Secondary | ICD-10-CM

## 2016-05-03 LAB — POCT INR: INR: 2.7

## 2016-06-14 ENCOUNTER — Ambulatory Visit (INDEPENDENT_AMBULATORY_CARE_PROVIDER_SITE_OTHER): Payer: Medicare HMO | Admitting: *Deleted

## 2016-06-14 DIAGNOSIS — I4891 Unspecified atrial fibrillation: Secondary | ICD-10-CM | POA: Diagnosis not present

## 2016-06-14 LAB — POCT INR: INR: 3.3

## 2016-06-28 ENCOUNTER — Ambulatory Visit (INDEPENDENT_AMBULATORY_CARE_PROVIDER_SITE_OTHER): Payer: Medicare HMO | Admitting: Pharmacist

## 2016-06-28 DIAGNOSIS — I4891 Unspecified atrial fibrillation: Secondary | ICD-10-CM

## 2016-06-28 LAB — POCT INR: INR: 1.9

## 2016-06-30 DIAGNOSIS — H52223 Regular astigmatism, bilateral: Secondary | ICD-10-CM | POA: Diagnosis not present

## 2016-06-30 DIAGNOSIS — H524 Presbyopia: Secondary | ICD-10-CM | POA: Diagnosis not present

## 2016-06-30 DIAGNOSIS — H26493 Other secondary cataract, bilateral: Secondary | ICD-10-CM | POA: Diagnosis not present

## 2016-06-30 DIAGNOSIS — H15002 Unspecified scleritis, left eye: Secondary | ICD-10-CM | POA: Diagnosis not present

## 2016-06-30 DIAGNOSIS — H5213 Myopia, bilateral: Secondary | ICD-10-CM | POA: Diagnosis not present

## 2016-06-30 DIAGNOSIS — H35371 Puckering of macula, right eye: Secondary | ICD-10-CM | POA: Diagnosis not present

## 2016-07-12 ENCOUNTER — Ambulatory Visit (INDEPENDENT_AMBULATORY_CARE_PROVIDER_SITE_OTHER): Payer: Medicare HMO

## 2016-07-12 DIAGNOSIS — I4891 Unspecified atrial fibrillation: Secondary | ICD-10-CM

## 2016-07-12 LAB — POCT INR: INR: 2.3

## 2016-08-06 ENCOUNTER — Other Ambulatory Visit: Payer: Self-pay | Admitting: Interventional Cardiology

## 2016-08-06 DIAGNOSIS — I480 Paroxysmal atrial fibrillation: Secondary | ICD-10-CM

## 2016-08-06 DIAGNOSIS — Z7901 Long term (current) use of anticoagulants: Secondary | ICD-10-CM

## 2016-08-09 ENCOUNTER — Ambulatory Visit (INDEPENDENT_AMBULATORY_CARE_PROVIDER_SITE_OTHER): Payer: Medicare HMO

## 2016-08-09 DIAGNOSIS — I4891 Unspecified atrial fibrillation: Secondary | ICD-10-CM | POA: Diagnosis not present

## 2016-08-09 LAB — POCT INR: INR: 3.4

## 2016-08-16 DIAGNOSIS — E0590 Thyrotoxicosis, unspecified without thyrotoxic crisis or storm: Secondary | ICD-10-CM | POA: Diagnosis not present

## 2016-08-16 DIAGNOSIS — E063 Autoimmune thyroiditis: Secondary | ICD-10-CM | POA: Diagnosis not present

## 2016-08-16 DIAGNOSIS — M109 Gout, unspecified: Secondary | ICD-10-CM | POA: Diagnosis not present

## 2016-08-16 DIAGNOSIS — Z0000 Encounter for general adult medical examination without abnormal findings: Secondary | ICD-10-CM | POA: Diagnosis not present

## 2016-08-16 DIAGNOSIS — Z1389 Encounter for screening for other disorder: Secondary | ICD-10-CM | POA: Diagnosis not present

## 2016-08-16 DIAGNOSIS — I481 Persistent atrial fibrillation: Secondary | ICD-10-CM | POA: Diagnosis not present

## 2016-08-16 DIAGNOSIS — I129 Hypertensive chronic kidney disease with stage 1 through stage 4 chronic kidney disease, or unspecified chronic kidney disease: Secondary | ICD-10-CM | POA: Diagnosis not present

## 2016-08-16 DIAGNOSIS — Z8546 Personal history of malignant neoplasm of prostate: Secondary | ICD-10-CM | POA: Diagnosis not present

## 2016-08-16 DIAGNOSIS — Z79899 Other long term (current) drug therapy: Secondary | ICD-10-CM | POA: Diagnosis not present

## 2016-08-16 DIAGNOSIS — N183 Chronic kidney disease, stage 3 (moderate): Secondary | ICD-10-CM | POA: Diagnosis not present

## 2016-08-16 DIAGNOSIS — Z23 Encounter for immunization: Secondary | ICD-10-CM | POA: Diagnosis not present

## 2016-08-30 ENCOUNTER — Ambulatory Visit (INDEPENDENT_AMBULATORY_CARE_PROVIDER_SITE_OTHER): Payer: Medicare HMO | Admitting: *Deleted

## 2016-08-30 DIAGNOSIS — I4891 Unspecified atrial fibrillation: Secondary | ICD-10-CM | POA: Diagnosis not present

## 2016-08-30 LAB — POCT INR: INR: 3.2

## 2016-09-13 ENCOUNTER — Ambulatory Visit (INDEPENDENT_AMBULATORY_CARE_PROVIDER_SITE_OTHER): Payer: Medicare HMO

## 2016-09-13 DIAGNOSIS — I482 Chronic atrial fibrillation, unspecified: Secondary | ICD-10-CM

## 2016-09-13 DIAGNOSIS — I4891 Unspecified atrial fibrillation: Secondary | ICD-10-CM | POA: Diagnosis not present

## 2016-09-13 LAB — POCT INR: INR: 2.6

## 2016-10-04 ENCOUNTER — Emergency Department (HOSPITAL_COMMUNITY)
Admission: EM | Admit: 2016-10-04 | Discharge: 2016-10-04 | Disposition: A | Discharge status: Home / Self Care | Payer: Medicare HMO | Attending: Emergency Medicine | Admitting: Emergency Medicine

## 2016-10-04 ENCOUNTER — Encounter (HOSPITAL_COMMUNITY): Payer: Self-pay | Admitting: Emergency Medicine

## 2016-10-04 ENCOUNTER — Emergency Department (HOSPITAL_COMMUNITY): Payer: Medicare HMO

## 2016-10-04 DIAGNOSIS — Z7901 Long term (current) use of anticoagulants: Secondary | ICD-10-CM | POA: Diagnosis not present

## 2016-10-04 DIAGNOSIS — Z8546 Personal history of malignant neoplasm of prostate: Secondary | ICD-10-CM | POA: Insufficient documentation

## 2016-10-04 DIAGNOSIS — N189 Chronic kidney disease, unspecified: Secondary | ICD-10-CM | POA: Insufficient documentation

## 2016-10-04 DIAGNOSIS — Y999 Unspecified external cause status: Secondary | ICD-10-CM | POA: Insufficient documentation

## 2016-10-04 DIAGNOSIS — I2510 Atherosclerotic heart disease of native coronary artery without angina pectoris: Secondary | ICD-10-CM | POA: Insufficient documentation

## 2016-10-04 DIAGNOSIS — Y9289 Other specified places as the place of occurrence of the external cause: Secondary | ICD-10-CM | POA: Insufficient documentation

## 2016-10-04 DIAGNOSIS — Z87891 Personal history of nicotine dependence: Secondary | ICD-10-CM | POA: Insufficient documentation

## 2016-10-04 DIAGNOSIS — S20212A Contusion of left front wall of thorax, initial encounter: Secondary | ICD-10-CM | POA: Insufficient documentation

## 2016-10-04 DIAGNOSIS — W0110XA Fall on same level from slipping, tripping and stumbling with subsequent striking against unspecified object, initial encounter: Secondary | ICD-10-CM | POA: Insufficient documentation

## 2016-10-04 DIAGNOSIS — R079 Chest pain, unspecified: Secondary | ICD-10-CM | POA: Diagnosis not present

## 2016-10-04 DIAGNOSIS — I129 Hypertensive chronic kidney disease with stage 1 through stage 4 chronic kidney disease, or unspecified chronic kidney disease: Secondary | ICD-10-CM | POA: Diagnosis not present

## 2016-10-04 DIAGNOSIS — Y9389 Activity, other specified: Secondary | ICD-10-CM | POA: Diagnosis not present

## 2016-10-04 DIAGNOSIS — J90 Pleural effusion, not elsewhere classified: Secondary | ICD-10-CM | POA: Diagnosis not present

## 2016-10-04 DIAGNOSIS — S299XXA Unspecified injury of thorax, initial encounter: Secondary | ICD-10-CM | POA: Diagnosis present

## 2016-10-04 LAB — I-STAT CREATININE, ED: CREATININE: 1.2 mg/dL (ref 0.61–1.24)

## 2016-10-04 LAB — PROTIME-INR
INR: 2.52
Prothrombin Time: 27.6 seconds — ABNORMAL HIGH (ref 11.4–15.2)

## 2016-10-04 MED ORDER — IOPAMIDOL (ISOVUE-300) INJECTION 61%
INTRAVENOUS | Status: AC
  Administered 2016-10-04: 75 mL
  Filled 2016-10-04: qty 75

## 2016-10-04 MED ORDER — HYDROCODONE-ACETAMINOPHEN 5-325 MG PO TABS
1.0000 | ORAL_TABLET | Freq: Once | ORAL | Status: AC
  Administered 2016-10-04: 1 via ORAL
  Filled 2016-10-04: qty 1

## 2016-10-04 NOTE — ED Provider Notes (Signed)
[name]   CSN: [id number] Arrival date & time: 10/04/16  1534  By signing my name below, I, Donald Rasmussen, attest that this documentation has been prepared under the direction and in the presence of [name] PA-C.  Electronically Signed: [name], ED Scribe. 10/04/16. 5:41 PM.  History   Chief Complaint Chief Complaint  Patient presents with  . Fall   HPI HPI Comments: Donald Rasmussen is a 84 y.o. male, Donald Rasmussen is a 83 y.o. male, with Hx of Cancer, CAD, HTN, A-fib, who presents to the Emergency Department s/p a mechanical fall that occurred at 1430 today, just prior to arrival. Pt tripped on a tree root and fell forwards onto his right knee, which caused him to strike his left anterior chest on the ground. He did not hit his head during the incident and denies any dizziness or lightheadedness since. He reports constant pain in his left anterior chest since the fall occurred. He reports exacerbation of pain during movement. Pt is currently taking Coumadin. Pt has been able to ambulate without difficulty. He does not use a walker to assist him at home. Pt denies pain in bilateral upper extremities. He denies any changes in vision since the incident. No shortness of breath. No numbness or weakness.  PCP: [name] Cardiologist: [name]  The history is provided by the patient, a significant other and a relative. No language interpreter was used.   Past Medical History:  Diagnosis Date  . Cancer [facility])    prostate; Dr. Amalia Hailey  . Chronic kidney disease    Stg III kidney disease; GRF 55  . Chronic low back pain    2000 lumbosacral spine degenerative change. Possible calculus  . Complication of anesthesia   . Coronary artery disease    BMS 1999  . Edentulous   . Gout   . Hyperlipidemia   . Hypertension   . Impaired fasting blood sugar   . Mucous cyst of tonsil    Patient Active Problem List   Diagnosis Date Noted  . History of amiodarone  therapy 08/12/2013  . Long term current use of anticoagulant therapy 08/12/2013  . Atrial fibrillation ([place]) 03/07/2012    Class: Chronic  . CAD (coronary artery disease), native coronary artery 03/07/2012    Class: Chronic  . Essential hypertension 03/07/2012    Class: Chronic    Past Surgical History:  Procedure Laterality Date  . CARDIAC CATHETERIZATION     stent   . CARDIOVERSION  03/07/2012   Procedure: CARDIOVERSION;  Surgeon: Sinclair Grooms, MD;  Location: [place];  Service: Cardiovascular;  Laterality: N/A;  . HEMORRHOID SURGERY    . RETROPUBIC PROSTATECTOMY  1997  . sebaceous cyst back       Home Medications    Prior to Admission medications   Medication Sig Start Date End Date Taking? Authorizing Provider  acetaminophen (TYLENOL) 500 MG tablet Take 500 mg by mouth every 6 (six) hours as needed (pain).     [provider]  allopurinol (ZYLOPRIM) 300 MG tablet Take 300 mg by mouth daily.    [provider]  amLODipine (NORVASC) 5 MG tablet Take 5 mg by mouth daily.    [provider]  atorvastatin (LIPITOR) 20 MG tablet Take 1 tablet (20 mg total) by mouth daily. 05/07/15   Belva Crome, MD  cholecalciferol (VITAMIN D) 1000 UNITS tablet Take 2,000 Units by mouth daily.    [provider]  EPINEPHrine (EPIPEN JR) 0.15 MG/0.3ML injection Inject 0.15 mg into the muscle as needed for anaphylaxis.     [provider]  fish oil-omega-3 fatty acids 1000 MG capsule Take 1,200 mg by mouth daily.    [provider]  metoprolol tartrate (LOPRESSOR) 25 MG tablet TAKE 1 TABLET TWICE DAILY 08/08/16   Belva Crome, MD  nitroGLYCERIN (NITROSTAT) 0.4 MG SL tablet Place 1 tablet (0.4 mg total) under the tongue every 5 (five) minutes as needed for chest pain. 06/04/15   Belva Crome, MD  quinapril (ACCUPRIL) 20 MG tablet Take 20 mg by mouth daily.    [provider]  warfarin (COUMADIN) 1 MG tablet TAKE AS DIRECTED  BY   COUMADIN  CLINIC 08/08/16   Belva Crome, MD   Family History Family History  Problem Relation Age of Onset  . Hyperlipidemia Mother   . Heart attack Mother   . Heart attack Father    Social History Social History  Substance Use Topics  . Smoking status: Former Research scientist (life sciences)  . Smokeless tobacco: Current User    Types: Chew  . Alcohol use 0.0 oz/week   Allergies   Bee venom   Review of Systems Review of Systems  Constitutional: Negative for activity change.  Respiratory: Negative for shortness of breath.   Cardiovascular: Negative for chest pain.  Gastrointestinal: Negative for abdominal pain.  Musculoskeletal: Positive for arthralgias (Right knee). Negative for back pain and gait problem.       Left anterior chest wall due to fall  Skin: Negative for rash.    Physical Exam Updated Vital Signs BP (!) 185/73 (BP Location: Left Arm)   Pulse 90   Temp 98 F (36.7 C) (Oral)   Resp 16   Ht 6\' 4"  (1.93 m)   Wt 81.6 kg (180 lb)   SpO2 97%   BMI 21.91 kg/m   Physical Exam  Constitutional: He appears well-developed.  HENT:  Head: Normocephalic.  Eyes: Conjunctivae are normal.  Neck: Neck supple.  Cardiovascular: Normal rate and regular rhythm.   No murmur heard. Pulmonary/Chest: Effort normal and breath sounds normal. He has no wheezes. He has no rales. He exhibits tenderness.  Pectus carinatum. Tenderness to palpation diffusely over the left anterior chest wall. No tenderness to palpation over the bilateral clavicles or the right anterior chest wall.  Abdominal: Soft. Bowel sounds are normal. He exhibits no distension and no mass. There is no tenderness. There is no guarding.  Musculoskeletal: Normal range of motion. He exhibits no edema, tenderness or deformity.  Neurological: He is alert.  Skin: Skin is warm and dry.  Psychiatric: His behavior is normal.  Nursing note and vitals reviewed.   ED Treatments / Results  DIAGNOSTIC STUDIES: Oxygen Saturation is 98% on  RA, normal by my interpretation.  COORDINATION OF CARE: 5:35 PM-Will order imaging. Discussed treatment plan with pt at bedside and pt agreed to plan.   6:30 PM - Discussed with Dr. Alvino Chapel. Will order CT Chest/Abdomen.   Labs (all labs ordered are listed, but only abnormal results are displayed) Labs Reviewed  PROTIME-INR - Abnormal; Notable for the following:       Result Value   Prothrombin Time 27.6 (*)    All other components within normal limits  I-STAT CREATININE, ED    EKG  EKG Interpretation None       Radiology Dg Chest 2 View  Result Date: 10/04/2016 CLINICAL DATA:  [name] on chest and stomach with left-sided  chest pain EXAM: CHEST  2 VIEW COMPARISON:  08/07/2012 FINDINGS: Hyperinflation is present. Coarse interstitial opacities are likely chronic. Linear scarring or atelectasis at the left base. Cardiomegaly without edema. Aortic atherosclerosis. Questionable irregularity of the left second rib and lucency in the left ninth rib. IMPRESSION: 1. Questionable left second and ninth rib deformities, suggest correlation with rib series given history of trauma and chest pain 2. No pneumothorax or pleural effusion 3. Cardiomegaly.  Linear atelectasis or scarring at the left base. Electronically Signed   By: Donavan Foil M.D.   On: 10/04/2016 17:56   Ct Chest W Contrast  Result Date: 10/04/2016 CLINICAL DATA:  Status post fall.  Pain along the left-sided ribs. EXAM: CT CHEST WITH CONTRAST TECHNIQUE: Multidetector CT imaging of the chest was performed during intravenous contrast administration. CONTRAST:  ISOVUE-300 IOPAMIDOL (ISOVUE-300) INJECTION 61% COMPARISON:  None. FINDINGS: Cardiovascular: No significant vascular findings. Mild cardiomegaly. Coronary artery atherosclerosis in the left main, LAD, circumflex and RCA. No pericardial effusion. Normal caliber thoracic aorta. Thoracic aortic atherosclerosis. Mediastinum/Nodes: No enlarged mediastinal, hilar, or axillary lymph nodes.  Thyroid gland, trachea, and esophagus demonstrate no significant findings. Lungs/Pleura: Small bilateral pleural effusions. Mild bibasilar atelectasis. No focal consolidation. No pneumothorax. Upper Abdomen: No acute abnormality. Musculoskeletal: No acute osseous abnormality. No lytic or sclerotic osseous lesion. Degenerative disc disease with disc height loss at L1-2. IMPRESSION: 1. Small bilateral pleural effusions. 2. Multi vessel coronary artery atherosclerosis. 3.  Aortic Atherosclerosis (ICD10-170.0) Electronically Signed   By: Kathreen Devoid   On: 10/04/2016 21:13   Procedures Procedures (including critical care time)  Medications Ordered in ED Medications  iopamidol (ISOVUE-300) 61 % injection (75 mLs  Contrast Given 10/04/16 2049)  HYDROcodone-acetaminophen (NORCO/VICODIN) 5-325 MG per tablet 1 tablet (1 tablet Oral Given 10/04/16 2206)     Initial Impression / Assessment and Plan / ED Course  I have reviewed the triage vital signs and the nursing notes.  Pertinent labs & imaging results that were available during my care of the patient were reviewed by me and considered in my medical decision making (see chart for details).   Pt on coumadin with rib contusion s/p fall. Tender to palpation in the area. No head injury. Patient with good tidal volume, no hemoptysis, no decreased breath sounds and no pneumothorax on x-ray. CT chest demonstrated no rib fractures. The patient was seen and evaluated by Dr. Alvino Chapel, attending physician. I have also discussed reasons to return immediately to the ER including difficulty breathing, hemoptysis.  Patient and his family express understanding and agrees with plan.  Final Clinical Impressions(s) / ED Diagnoses   Final diagnoses:  Rib contusion, left, initial encounter    New Prescriptions Discharge Medication List as of 10/04/2016  9:59 PM    I personally performed the services described in this documentation, which was scribed in my presence. The  recorded information has been reviewed and is accurate.     Joline Maxcy A, PA-C 10/05/16 [place], Nathan, MD 10/10/16 [phone]

## 2016-10-04 NOTE — ED Triage Notes (Signed)
Pt st's he tripped on a root in the ground and fell.  Pt denies pain but st's his left ribs are a little sore,  Pt is on Coumadin and his wife just wants him checked.  Pt denies hitting his head.

## 2016-10-04 NOTE — Discharge Instructions (Signed)
Rib contusions or bruising to the area. You can apply ice packs for 15-20 minutes up to 3-4 times a day to help with swelling and pain. You can take Tylenol 600 mg every 4-6 hours as needed for pain control. Please do not take any over-the-counter medications that also contain Tylenol or acetaminophen. You can follow up with her primary care provider if symptoms persist. If he develops any new or worsening symptoms including worsening shortness of breath, fever, chills, cough, or chest pain, please return to the emergency department for reevaluation.

## 2016-10-04 NOTE — ED Notes (Signed)
Patient transported to CT 

## 2016-10-10 ENCOUNTER — Ambulatory Visit (INDEPENDENT_AMBULATORY_CARE_PROVIDER_SITE_OTHER): Payer: Medicare HMO

## 2016-10-10 DIAGNOSIS — I482 Chronic atrial fibrillation, unspecified: Secondary | ICD-10-CM

## 2016-10-10 DIAGNOSIS — I4891 Unspecified atrial fibrillation: Secondary | ICD-10-CM | POA: Diagnosis not present

## 2016-10-10 LAB — POCT INR: INR: 2.7

## 2016-10-13 ENCOUNTER — Other Ambulatory Visit: Payer: Self-pay | Admitting: Interventional Cardiology

## 2016-11-03 ENCOUNTER — Other Ambulatory Visit: Payer: Self-pay | Admitting: Interventional Cardiology

## 2016-11-07 ENCOUNTER — Ambulatory Visit (INDEPENDENT_AMBULATORY_CARE_PROVIDER_SITE_OTHER): Payer: Medicare HMO

## 2016-11-07 DIAGNOSIS — I482 Chronic atrial fibrillation, unspecified: Secondary | ICD-10-CM

## 2016-11-07 DIAGNOSIS — I4891 Unspecified atrial fibrillation: Secondary | ICD-10-CM | POA: Diagnosis not present

## 2016-11-07 LAB — POCT INR: INR: 2.6

## 2016-12-19 ENCOUNTER — Ambulatory Visit (INDEPENDENT_AMBULATORY_CARE_PROVIDER_SITE_OTHER): Payer: Medicare HMO | Admitting: *Deleted

## 2016-12-19 DIAGNOSIS — I4891 Unspecified atrial fibrillation: Secondary | ICD-10-CM | POA: Diagnosis not present

## 2016-12-19 DIAGNOSIS — I482 Chronic atrial fibrillation, unspecified: Secondary | ICD-10-CM

## 2016-12-19 LAB — POCT INR: INR: 3.1

## 2017-01-18 ENCOUNTER — Ambulatory Visit (INDEPENDENT_AMBULATORY_CARE_PROVIDER_SITE_OTHER): Payer: Medicare HMO | Admitting: *Deleted

## 2017-01-18 DIAGNOSIS — I4891 Unspecified atrial fibrillation: Secondary | ICD-10-CM

## 2017-01-18 DIAGNOSIS — Z5181 Encounter for therapeutic drug level monitoring: Secondary | ICD-10-CM

## 2017-01-18 DIAGNOSIS — I482 Chronic atrial fibrillation, unspecified: Secondary | ICD-10-CM

## 2017-01-18 LAB — POCT INR: INR: 2.6

## 2017-02-21 ENCOUNTER — Other Ambulatory Visit: Payer: Self-pay | Admitting: Interventional Cardiology

## 2017-02-21 DIAGNOSIS — Z7901 Long term (current) use of anticoagulants: Secondary | ICD-10-CM

## 2017-02-21 DIAGNOSIS — I480 Paroxysmal atrial fibrillation: Secondary | ICD-10-CM

## 2017-02-22 ENCOUNTER — Ambulatory Visit (INDEPENDENT_AMBULATORY_CARE_PROVIDER_SITE_OTHER): Payer: Medicare HMO | Admitting: *Deleted

## 2017-02-22 DIAGNOSIS — Z5181 Encounter for therapeutic drug level monitoring: Secondary | ICD-10-CM

## 2017-02-22 DIAGNOSIS — I482 Chronic atrial fibrillation, unspecified: Secondary | ICD-10-CM

## 2017-02-22 DIAGNOSIS — I4891 Unspecified atrial fibrillation: Secondary | ICD-10-CM | POA: Diagnosis not present

## 2017-02-22 LAB — POCT INR: INR: 3.7

## 2017-03-15 ENCOUNTER — Ambulatory Visit (INDEPENDENT_AMBULATORY_CARE_PROVIDER_SITE_OTHER): Payer: Medicare HMO | Admitting: Pharmacist

## 2017-03-15 DIAGNOSIS — I4891 Unspecified atrial fibrillation: Secondary | ICD-10-CM | POA: Diagnosis not present

## 2017-03-15 DIAGNOSIS — I482 Chronic atrial fibrillation, unspecified: Secondary | ICD-10-CM

## 2017-03-15 LAB — POCT INR: INR: 2.8

## 2017-03-15 NOTE — Patient Instructions (Signed)
Continue on same dosage 1 tablet every day except 2 tablets on Mondays, Wednesdays, and Fridays.  Recheck in 3 weeks. Call if have any questions [phone]

## 2017-03-26 ENCOUNTER — Other Ambulatory Visit: Payer: Self-pay | Admitting: Interventional Cardiology

## 2017-04-26 ENCOUNTER — Ambulatory Visit (INDEPENDENT_AMBULATORY_CARE_PROVIDER_SITE_OTHER): Payer: Medicare HMO | Admitting: Pharmacist

## 2017-04-26 DIAGNOSIS — I4891 Unspecified atrial fibrillation: Secondary | ICD-10-CM

## 2017-04-26 DIAGNOSIS — I482 Chronic atrial fibrillation, unspecified: Secondary | ICD-10-CM

## 2017-04-26 LAB — POCT INR: INR: 3.2

## 2017-04-26 NOTE — Patient Instructions (Signed)
Description   No Coumadin tomorrow (since already took today) then continue on same dosage 1 tablet every day except 2 tablets on Mondays, Wednesdays, and Fridays.  Recheck in 3 weeks. Call if have any questions [phone]

## 2017-05-17 ENCOUNTER — Ambulatory Visit (INDEPENDENT_AMBULATORY_CARE_PROVIDER_SITE_OTHER): Payer: Medicare HMO | Admitting: *Deleted

## 2017-05-17 DIAGNOSIS — Z5181 Encounter for therapeutic drug level monitoring: Secondary | ICD-10-CM

## 2017-05-17 DIAGNOSIS — I4891 Unspecified atrial fibrillation: Secondary | ICD-10-CM

## 2017-05-17 DIAGNOSIS — I482 Chronic atrial fibrillation, unspecified: Secondary | ICD-10-CM

## 2017-05-17 LAB — POCT INR: INR: 2.4

## 2017-05-17 NOTE — Patient Instructions (Signed)
Description   Continue on same dosage 1 tablet every day except 2 tablets on Mondays, Wednesdays, and Fridays.  Recheck in 4 weeks. Call if have any questions [phone]     

## 2017-05-18 ENCOUNTER — Other Ambulatory Visit: Payer: Self-pay | Admitting: Interventional Cardiology

## 2017-06-14 ENCOUNTER — Ambulatory Visit (INDEPENDENT_AMBULATORY_CARE_PROVIDER_SITE_OTHER): Payer: Medicare HMO | Admitting: *Deleted

## 2017-06-14 DIAGNOSIS — I482 Chronic atrial fibrillation, unspecified: Secondary | ICD-10-CM

## 2017-06-14 DIAGNOSIS — I4891 Unspecified atrial fibrillation: Secondary | ICD-10-CM | POA: Diagnosis not present

## 2017-06-14 DIAGNOSIS — Z5181 Encounter for therapeutic drug level monitoring: Secondary | ICD-10-CM | POA: Diagnosis not present

## 2017-06-14 LAB — POCT INR: INR: 2.7

## 2017-06-14 NOTE — Patient Instructions (Signed)
Description   Continue on same dosage 1 tablet every day except 2 tablets on Mondays, Wednesdays, and Fridays.  Recheck in 4 weeks. Call if have any questions [phone]     

## 2017-06-20 NOTE — Progress Notes (Signed)
Cardiology Office Note    Date:  06/21/2017   ID:  [name], DOB 09/18/1932, MRN [id number]  PCP:  Lajean Manes, MD  Cardiologist: Sinclair Grooms, MD   Chief Complaint  Patient presents with  . Coronary Artery Disease  . Atrial Fibrillation    History of Present Illness:  Donald Rasmussen is a 84 y.o. male paroxysmal atrial fibrillation, coronary artery disease with prior RCA stent, hypertension, chronic anticoagulation therapy, and stage III chronic kidney disease.   He is doing well.  He has not needed nitroglycerin in the interval since last being seen which is greater than 1 year.  He denies exertional limitations.  He has not had chest pain, orthopnea, or PND.  No episodes of syncope.  No nitroglycerin use.  He is able to drive and to do work around his house without significant problems.   Past Medical History:  Diagnosis Date  . Cancer [facility])    prostate; Dr. Amalia Hailey  . Chronic kidney disease    Stg III kidney disease; GRF 55  . Chronic low back pain    2000 lumbosacral spine degenerative change. Possible calculus  . Complication of anesthesia   . Coronary artery disease    BMS 1999  . Edentulous   . Gout   . Hyperlipidemia   . Hypertension   . Impaired fasting blood sugar   . Mucous cyst of tonsil     Past Surgical History:  Procedure Laterality Date  . CARDIAC CATHETERIZATION     stent   . CARDIOVERSION  03/07/2012   Procedure: CARDIOVERSION;  Surgeon: Sinclair Grooms, MD;  Location: [facility];  Service: Cardiovascular;  Laterality: N/A;  . HEMORRHOID SURGERY    . RETROPUBIC PROSTATECTOMY  1997  . sebaceous cyst back      Current Medications: Outpatient Medications Prior to Visit  Medication Sig Dispense Refill  . acetaminophen (TYLENOL) 500 MG tablet Take 500 mg by mouth every 6 (six) hours as needed (pain).     Marland Kitchen allopurinol (ZYLOPRIM) 300 MG tablet Take 300 mg by mouth daily.    Marland Kitchen amLODipine (NORVASC) 5 MG tablet Take 1 tablet (5 mg total)  by mouth daily. Please make overdue yearly appt with Dr. Tamala Rasmussen. 2nd attempt 15 tablet 0  . atorvastatin (LIPITOR) 20 MG tablet Take 1 tablet (20 mg total) by mouth daily. 90 tablet 1  . cholecalciferol (VITAMIN D) 1000 UNITS tablet Take 2,000 Units by mouth daily.    Marland Kitchen EPINEPHrine (EPIPEN JR) 0.15 MG/0.3ML injection Inject 0.15 mg into the muscle as needed for anaphylaxis.     . fish oil-omega-3 fatty acids 1000 MG capsule Take 1,200 mg by mouth daily.    . metoprolol tartrate (LOPRESSOR) 25 MG tablet TAKE 1 TABLET TWICE DAILY 180 tablet 3  . nitroGLYCERIN (NITROSTAT) 0.4 MG SL tablet Place 1 tablet (0.4 mg total) under the tongue every 5 (five) minutes as needed for chest pain. 25 tablet 3  . quinapril (ACCUPRIL) 20 MG tablet Take 1 tablet (20 mg total) by mouth daily. Please make overdue yearly appt with Dr. Tamala Rasmussen. 2nd attempt 15 tablet 0  . warfarin (COUMADIN) 1 MG tablet TAKE AS DIRECTED  BY  COUMADIN  CLINIC 150 tablet 1   No facility-administered medications prior to visit.      Allergies:   Bee venom   Social History   Socioeconomic History  . Marital status: Married    Spouse name: None  . Number of  children: None  . Years of education: None  . Highest education level: None  Social Needs  . Financial resource strain: None  . Food insecurity - worry: None  . Food insecurity - inability: None  . Transportation needs - medical: None  . Transportation needs - non-medical: None  Occupational History  . None  Tobacco Use  . Smoking status: Former Research scientist (life sciences)  . Smokeless tobacco: Current User    Types: Chew  Substance and Sexual Activity  . Alcohol use: Yes    Alcohol/week: 0.0 oz  . Drug use: No  . Sexual activity: None  Other Topics Concern  . None  Social History Narrative  . None     Family History:  The patient's family history includes Heart attack in his father and mother; Hyperlipidemia in his mother.   ROS:   Please see the history of present illness.      Arthritis of the lower back.  Otherwise no significant complaints. All other systems reviewed and are negative.   PHYSICAL EXAM:   VS:  BP 132/86   Pulse 70   Ht 6\' 3"  (1.905 m)   Wt 180 lb 9.6 oz (81.9 kg)   BMI 22.57 kg/m    GEN: Well nourished, well developed, in no acute distress.  Slender, elderly, vigorous appearing HEENT: normal  Neck: no JVD, carotid bruits, or masses Cardiac: IIRR; no murmurs, rubs, or gallops,no edema  Respiratory:  clear to auscultation bilaterally, normal work of breathing GI: soft, nontender, nondistended, + BS MS: no deformity or atrophy  Skin: warm and dry, no rash Neuro:  Alert and Oriented x 3, Strength and sensation are intact Psych: euthymic mood, full affect  Wt Readings from Last 3 Encounters:  06/21/17 180 lb 9.6 oz (81.9 kg)  10/04/16 180 lb (81.6 kg)  01/29/16 184 lb 1.9 oz (83.5 kg)      Studies/Labs Reviewed:   EKG:  EKG atrial fibrillation with probable aberrantly conducted Donald Rasmussen beats.  Left axis deviation.  When compared to the prior tracing, the a barely conducted beats are more frequent.  Recent Labs: 10/04/2016: Creatinine, Ser 1.20   Lipid Panel No results found for: CHOL, TRIG, HDL, CHOLHDL, VLDL, LDLCALC, LDLDIRECT  Additional studies/ records that were reviewed today include:  None    ASSESSMENT:    1. Chronic atrial fibrillation ([place])   2. Coronary artery disease involving native coronary artery of native heart without angina pectoris   3. Essential hypertension   4. History of amiodarone therapy   5. Long term current use of anticoagulant therapy      PLAN:  In order of problems listed above:  1. Stable with good rate control.  Aspirin beats are noted.  No significant change from prior. 2. No symptoms of angina.  Continue clinical observation. 3. Blood pressure is excellently controlled on the current regimen. 4. No longer on therapy. 5. No bleeding on Coumadin.  Encouraged a continued active  lifestyle.  Clinical follow-up in 1 year.  Call if chest pain, dyspnea, or edema.    Medication Adjustments/Labs and Tests Ordered: Current medicines are reviewed at length with the patient today.  Concerns regarding medicines are outlined above.  Medication changes, Labs and Tests ordered today are listed in the Patient Instructions below. Patient Instructions  Medication Instructions:  Your physician recommends that you continue on your current medications as directed. Please refer to the Current Medication list given to you today.  Labwork: None  Testing/Procedures: None  Follow-Up: Your  physician wants you to follow-up in: 1 year with Dr. Tamala Rasmussen.  You will receive a reminder letter in the mail two months in advance. If you don't receive a letter, please call our office to schedule the follow-up appointment.   Any Other Special Instructions Will Be Listed Below (If Applicable).     If you need a refill on your cardiac medications before your next appointment, please call your pharmacy.      Signed, Sinclair Grooms, MD  06/21/2017 3:12 PM    [facility] [place], [place], [place]  [address] Phone: [phone]; Fax: [phone]

## 2017-06-21 ENCOUNTER — Ambulatory Visit: Payer: Medicare HMO | Admitting: Interventional Cardiology

## 2017-06-21 ENCOUNTER — Encounter: Payer: Self-pay | Admitting: Interventional Cardiology

## 2017-06-21 VITALS — BP 132/86 | HR 70 | Ht 75.0 in | Wt 180.6 lb

## 2017-06-21 DIAGNOSIS — I482 Chronic atrial fibrillation, unspecified: Secondary | ICD-10-CM

## 2017-06-21 DIAGNOSIS — I10 Essential (primary) hypertension: Secondary | ICD-10-CM | POA: Diagnosis not present

## 2017-06-21 DIAGNOSIS — Z7901 Long term (current) use of anticoagulants: Secondary | ICD-10-CM

## 2017-06-21 DIAGNOSIS — Z9229 Personal history of other drug therapy: Secondary | ICD-10-CM | POA: Diagnosis not present

## 2017-06-21 DIAGNOSIS — I2510 Atherosclerotic heart disease of native coronary artery without angina pectoris: Secondary | ICD-10-CM

## 2017-06-21 NOTE — Patient Instructions (Signed)

## 2017-06-23 ENCOUNTER — Other Ambulatory Visit: Payer: Self-pay | Admitting: Interventional Cardiology

## 2017-06-23 DIAGNOSIS — Z7901 Long term (current) use of anticoagulants: Secondary | ICD-10-CM

## 2017-06-23 DIAGNOSIS — I480 Paroxysmal atrial fibrillation: Secondary | ICD-10-CM

## 2017-07-12 ENCOUNTER — Ambulatory Visit (INDEPENDENT_AMBULATORY_CARE_PROVIDER_SITE_OTHER): Payer: Medicare HMO | Admitting: Pharmacist

## 2017-07-12 DIAGNOSIS — I4891 Unspecified atrial fibrillation: Secondary | ICD-10-CM | POA: Diagnosis not present

## 2017-07-12 DIAGNOSIS — I482 Chronic atrial fibrillation, unspecified: Secondary | ICD-10-CM

## 2017-07-12 LAB — POCT INR: INR: 3.2

## 2017-07-12 NOTE — Patient Instructions (Signed)
Description   Skip your dose tomorrow (already took today's dose), then continue on same dosage 1 tablet every day except 2 tablets on Mondays, Wednesdays, and Fridays.  Recheck in 4 weeks. Call if have any questions [phone]

## 2017-08-09 ENCOUNTER — Ambulatory Visit (INDEPENDENT_AMBULATORY_CARE_PROVIDER_SITE_OTHER): Payer: Medicare HMO | Admitting: *Deleted

## 2017-08-09 DIAGNOSIS — I4891 Unspecified atrial fibrillation: Secondary | ICD-10-CM | POA: Diagnosis not present

## 2017-08-09 DIAGNOSIS — I482 Chronic atrial fibrillation, unspecified: Secondary | ICD-10-CM

## 2017-08-09 LAB — POCT INR: INR: 2.9

## 2017-08-09 NOTE — Patient Instructions (Signed)
Description   Continue on same dosage 1 tablet every day except 2 tablets on Mondays, Wednesdays, and Fridays.  Recheck in 4 weeks. Call if have any questions [phone]     

## 2017-08-30 DIAGNOSIS — Z1389 Encounter for screening for other disorder: Secondary | ICD-10-CM | POA: Diagnosis not present

## 2017-08-30 DIAGNOSIS — Z0000 Encounter for general adult medical examination without abnormal findings: Secondary | ICD-10-CM | POA: Diagnosis not present

## 2017-08-30 DIAGNOSIS — I129 Hypertensive chronic kidney disease with stage 1 through stage 4 chronic kidney disease, or unspecified chronic kidney disease: Secondary | ICD-10-CM | POA: Diagnosis not present

## 2017-08-30 DIAGNOSIS — S0181XA Laceration without foreign body of other part of head, initial encounter: Secondary | ICD-10-CM | POA: Diagnosis not present

## 2017-08-30 DIAGNOSIS — E0590 Thyrotoxicosis, unspecified without thyrotoxic crisis or storm: Secondary | ICD-10-CM | POA: Diagnosis not present

## 2017-08-30 DIAGNOSIS — N183 Chronic kidney disease, stage 3 (moderate): Secondary | ICD-10-CM | POA: Diagnosis not present

## 2017-08-30 DIAGNOSIS — I481 Persistent atrial fibrillation: Secondary | ICD-10-CM | POA: Diagnosis not present

## 2017-08-30 DIAGNOSIS — E7800 Pure hypercholesterolemia, unspecified: Secondary | ICD-10-CM | POA: Diagnosis not present

## 2017-08-30 DIAGNOSIS — Z8546 Personal history of malignant neoplasm of prostate: Secondary | ICD-10-CM | POA: Diagnosis not present

## 2017-08-30 DIAGNOSIS — Z79899 Other long term (current) drug therapy: Secondary | ICD-10-CM | POA: Diagnosis not present

## 2017-09-04 DIAGNOSIS — Z79899 Other long term (current) drug therapy: Secondary | ICD-10-CM | POA: Diagnosis not present

## 2017-10-04 ENCOUNTER — Ambulatory Visit (INDEPENDENT_AMBULATORY_CARE_PROVIDER_SITE_OTHER): Payer: Medicare HMO | Admitting: *Deleted

## 2017-10-04 DIAGNOSIS — I482 Chronic atrial fibrillation, unspecified: Secondary | ICD-10-CM

## 2017-10-04 DIAGNOSIS — Z5181 Encounter for therapeutic drug level monitoring: Secondary | ICD-10-CM

## 2017-10-04 DIAGNOSIS — I4891 Unspecified atrial fibrillation: Secondary | ICD-10-CM

## 2017-10-04 LAB — POCT INR: INR: 2.7 (ref 2.0–3.0)

## 2017-10-04 NOTE — Patient Instructions (Signed)
Description   Continue on same dosage 1 tablet every day except 2 tablets on Mondays, Wednesdays, and Fridays.  Recheck in 4 weeks. Call if have any questions [phone]     

## 2017-10-11 ENCOUNTER — Encounter: Payer: Self-pay | Admitting: Nurse Practitioner

## 2017-10-11 ENCOUNTER — Ambulatory Visit (INDEPENDENT_AMBULATORY_CARE_PROVIDER_SITE_OTHER): Payer: Medicare HMO | Admitting: Nurse Practitioner

## 2017-10-11 VITALS — BP 138/70 | HR 70 | Ht 75.0 in | Wt 183.8 lb

## 2017-10-11 DIAGNOSIS — I4891 Unspecified atrial fibrillation: Secondary | ICD-10-CM | POA: Diagnosis not present

## 2017-10-11 DIAGNOSIS — Z7901 Long term (current) use of anticoagulants: Secondary | ICD-10-CM

## 2017-10-11 NOTE — Patient Instructions (Signed)
We will be checking the following labs today - NONE   Medication Instructions:    Continue with your current medicines.     Testing/Procedures To Be Arranged:  N/A  Follow-Up:   See [name] as planned in February of 2020.    Other Special Instructions:   N/A    If you need a refill on your cardiac medications before your next appointment, please call your pharmacy.   Call the [facility] office at [phone] if you have any questions, problems or concerns.

## 2017-10-11 NOTE — Progress Notes (Signed)
CARDIOLOGY OFFICE NOTE  Date:  10/11/2017    [name] Date of Birth: [birth year] Medical Record #[id number]  PCP:  Lajean Manes, MD  Cardiologist:  Tamala Julian  Chief Complaint  Patient presents with  . Shortness of Breath    Work in visit - seen for [name]    History of Present Illness: Donald Rasmussen is a 84 y.o. male who presents today for a work in visit. Seen for [name].   He has a history of persistent atrial fibrillation, coronary artery disease with prior RCA stent, hypertension, chronic anticoagulation therapy, and stage III chronic kidney disease.   Last seen by [name] back in February of 2019 and was felt to be doing ok. He is noted to be in chronic AF. He was to return in one year.   Comes in today. Here alone. He does not know why he is here today.  He is not aware of being short of breath. There are no phone notes noted. Lab from May noted by his PCP.   Past Medical History:  Diagnosis Date  . Cancer [facility])    prostate; Dr. Amalia Hailey  . Chronic kidney disease    Stg III kidney disease; GRF 55  . Chronic low back pain    2000 lumbosacral spine degenerative change. Possible calculus  . Complication of anesthesia   . Coronary artery disease    BMS 1999  . Edentulous   . Gout   . Hyperlipidemia   . Hypertension   . Impaired fasting blood sugar   . Mucous cyst of tonsil     Past Surgical History:  Procedure Laterality Date  . CARDIAC CATHETERIZATION     stent   . CARDIOVERSION  03/07/2012   Procedure: CARDIOVERSION;  Surgeon: Sinclair Grooms, MD;  Location: [facility];  Service: Cardiovascular;  Laterality: N/A;  . HEMORRHOID SURGERY    . RETROPUBIC PROSTATECTOMY  1997  . sebaceous cyst back       Medications: Current Meds  Medication Sig  . acetaminophen (TYLENOL) 500 MG tablet Take 500 mg by mouth every 6 (six) hours as needed (pain).   Marland Kitchen allopurinol (ZYLOPRIM) 300 MG tablet Take 300 mg by mouth daily.  Marland Kitchen amLODipine (NORVASC) 5 MG  tablet Take 1 tablet (5 mg total) by mouth daily.  Marland Kitchen atorvastatin (LIPITOR) 20 MG tablet Take 1 tablet (20 mg total) by mouth daily.  . cholecalciferol (VITAMIN D) 1000 UNITS tablet Take 2,000 Units by mouth daily.  Marland Kitchen EPINEPHrine (EPIPEN JR) 0.15 MG/0.3ML injection Inject 0.15 mg into the muscle as needed for anaphylaxis.   . fish oil-omega-3 fatty acids 1000 MG capsule Take 1,200 mg by mouth daily.  . metoprolol tartrate (LOPRESSOR) 25 MG tablet Take 25 mg by mouth 2 (two) times daily.  . nitroGLYCERIN (NITROSTAT) 0.4 MG SL tablet Place 1 tablet (0.4 mg total) under the tongue every 5 (five) minutes as needed for chest pain.  Marland Kitchen quinapril (ACCUPRIL) 20 MG tablet Take 1 tablet (20 mg total) by mouth daily.  Marland Kitchen warfarin (COUMADIN) 1 MG tablet TAKE AS DIRECTED  BY  COUMADIN  CLINIC     Allergies: Allergies  Allergen Reactions  . Bee Venom Swelling    Social History: The patient  reports that he has quit smoking. His smokeless tobacco use includes chew. He reports that he drinks alcohol. He reports that he does not use drugs.   Family History: The patient's family history includes Heart attack  in his father and mother; Hyperlipidemia in his mother.   Review of Systems: Please see the history of present illness.   Otherwise, the review of systems is positive for none.   All other systems are reviewed and negative.   Physical Exam: VS:  BP 138/70 (BP Location: Left Arm, Patient Position: Sitting, Cuff Size: Normal)   Pulse 70   Ht 6\' 3"  (1.905 m)   Wt 183 lb 12.8 oz (83.4 kg)   SpO2 99% Comment: at rest  BMI 22.97 kg/m  .  BMI Body mass index is 22.97 kg/m.  Wt Readings from Last 3 Encounters:  10/11/17 183 lb 12.8 oz (83.4 kg)  06/21/17 180 lb 9.6 oz (81.9 kg)  10/04/16 180 lb (81.6 kg)    General: Pleasant. Well developed, well nourished and in no acute distress.   HEENT: Normal.  Neck: Supple, no JVD, carotid bruits, or masses noted.  Cardiac: Regular rate and rhythm. No  murmurs, rubs, or gallops. No edema.  Respiratory:  Lungs are clear to auscultation bilaterally with normal work of breathing.  GI: Soft and nontender.  MS: No deformity or atrophy. Gait and ROM intact.  Skin: Warm and dry. Color is normal.  Neuro:  Strength and sensation are intact and no gross focal deficits noted.  Psych: Alert, appropriate and with normal affect.   LABORATORY DATA:  EKG:  EKG is not ordered today.   Lab Results  Component Value Date   ALT 11 08/19/2014   AST 14 08/19/2014   CREATININE 1.20 10/04/2016   TSH 0.03 (L) 11/14/2014   INR 2.7 10/04/2017   Lab Results  Component Value Date   INR 2.7 10/04/2017   INR 2.9 08/09/2017   INR 3.2 07/12/2017           BNP (last 3 results) No results for input(s): BNP in the last 8760 hours.  ProBNP (last 3 results) No results for input(s): PROBNP in the last 8760 hours.   Other Studies Reviewed Today:  Myoview Study Highlights from 10/2015   Nuclear stress EF: 47%. There was septal wall hypokinesis.  There was no ST segment deviation noted during stress.  This is a low risk study. Old basal inferior infarct noted. No ischemia identified.  Defect 1: There is a medium defect of mild severity present in the basal inferior location.  Findings consistent with prior myocardial infarction.   Candee Furbish, MD   Notes Recorded by Belva Crome, MD on 11/11/2015 at 6:43 PM Low risk study. No further evaluation needed unless symptoms continued to progress.  Assessment/Plan:  1. Persistent AF - his rate is fine. Remains on anticoagulation.   2. Chronic anticoagulation - no problems noted.   3. HTN - BP is fine. No changes made today.   4. CAD - no active chest pain.   Current medicines are reviewed with the patient today.  The patient does not have concerns regarding medicines other than what has been noted above.  The following changes have been made:  See above.  Labs/ tests ordered today include:     No orders of the defined types were placed in this encounter.    Disposition:   FU with [name] as planned in February.   Patient is agreeable to this plan and will call if any problems develop in the interim.   SignedTruitt Merle, NP  10/11/2017 1:53 PM  [facility] [address] [place],   [address]  Phone: [phone] Fax: [phone]

## 2017-11-01 ENCOUNTER — Ambulatory Visit (INDEPENDENT_AMBULATORY_CARE_PROVIDER_SITE_OTHER): Payer: Medicare HMO | Admitting: Pharmacist

## 2017-11-01 DIAGNOSIS — I4891 Unspecified atrial fibrillation: Secondary | ICD-10-CM

## 2017-11-01 DIAGNOSIS — I482 Chronic atrial fibrillation, unspecified: Secondary | ICD-10-CM

## 2017-11-01 LAB — POCT INR: INR: 3 (ref 2.0–3.0)

## 2017-11-01 NOTE — Patient Instructions (Signed)
Description   Continue on same dosage 1 tablet every day except 2 tablets on Mondays, Wednesdays, and Fridays.  Recheck in 4 weeks. Call if have any questions [phone]

## 2017-11-29 ENCOUNTER — Ambulatory Visit (INDEPENDENT_AMBULATORY_CARE_PROVIDER_SITE_OTHER): Payer: Medicare HMO | Admitting: *Deleted

## 2017-11-29 DIAGNOSIS — I4891 Unspecified atrial fibrillation: Secondary | ICD-10-CM

## 2017-11-29 DIAGNOSIS — I482 Chronic atrial fibrillation, unspecified: Secondary | ICD-10-CM

## 2017-11-29 LAB — POCT INR: INR: 2.2 (ref 2.0–3.0)

## 2017-11-29 NOTE — Patient Instructions (Signed)
Description   Continue on same dosage 1 tablet every day except 2 tablets on Mondays, Wednesdays, and Fridays.  Recheck in 6 weeks. Call if have any questions [phone]

## 2018-01-10 ENCOUNTER — Ambulatory Visit (INDEPENDENT_AMBULATORY_CARE_PROVIDER_SITE_OTHER): Payer: Medicare HMO | Admitting: *Deleted

## 2018-01-10 DIAGNOSIS — I482 Chronic atrial fibrillation, unspecified: Secondary | ICD-10-CM

## 2018-01-10 DIAGNOSIS — I4891 Unspecified atrial fibrillation: Secondary | ICD-10-CM

## 2018-01-10 LAB — POCT INR: INR: 3.4 — AB (ref 2.0–3.0)

## 2018-01-10 NOTE — Patient Instructions (Signed)
Description   Do not take any Coumadin today then continue on same dosage 1 tablet every day except 2 tablets on Mondays, Wednesdays, and Fridays.  Recheck in 4 weeks. Call if have any questions [phone]

## 2018-01-16 ENCOUNTER — Other Ambulatory Visit: Payer: Self-pay | Admitting: Interventional Cardiology

## 2018-01-16 DIAGNOSIS — I480 Paroxysmal atrial fibrillation: Secondary | ICD-10-CM

## 2018-01-16 DIAGNOSIS — Z7901 Long term (current) use of anticoagulants: Secondary | ICD-10-CM

## 2018-02-06 DIAGNOSIS — N183 Chronic kidney disease, stage 3 (moderate): Secondary | ICD-10-CM | POA: Diagnosis not present

## 2018-02-06 DIAGNOSIS — K4090 Unilateral inguinal hernia, without obstruction or gangrene, not specified as recurrent: Secondary | ICD-10-CM | POA: Diagnosis not present

## 2018-02-06 DIAGNOSIS — M545 Low back pain: Secondary | ICD-10-CM | POA: Diagnosis not present

## 2018-02-06 DIAGNOSIS — G8929 Other chronic pain: Secondary | ICD-10-CM | POA: Diagnosis not present

## 2018-02-06 DIAGNOSIS — I129 Hypertensive chronic kidney disease with stage 1 through stage 4 chronic kidney disease, or unspecified chronic kidney disease: Secondary | ICD-10-CM | POA: Diagnosis not present

## 2018-02-06 DIAGNOSIS — I4819 Other persistent atrial fibrillation: Secondary | ICD-10-CM | POA: Diagnosis not present

## 2018-02-07 ENCOUNTER — Ambulatory Visit (INDEPENDENT_AMBULATORY_CARE_PROVIDER_SITE_OTHER): Payer: Medicare HMO | Admitting: *Deleted

## 2018-02-07 DIAGNOSIS — I482 Chronic atrial fibrillation, unspecified: Secondary | ICD-10-CM | POA: Diagnosis not present

## 2018-02-07 DIAGNOSIS — I4891 Unspecified atrial fibrillation: Secondary | ICD-10-CM

## 2018-02-07 LAB — POCT INR: INR: 3.9 — AB (ref 2.0–3.0)

## 2018-02-07 NOTE — Patient Instructions (Addendum)
  Description   Do not take any Coumadin tomorrow, then start taking 1 tablet every day except 2 tablets on Mondays and Fridays.  Recheck in 2 weeks. Call if have any questions [phone]

## 2018-02-08 ENCOUNTER — Ambulatory Visit
Admission: RE | Admit: 2018-02-08 | Discharge: 2018-02-08 | Disposition: A | Discharge status: Home / Self Care | Payer: Medicare HMO | Source: Ambulatory Visit | Attending: Geriatric Medicine | Admitting: Geriatric Medicine

## 2018-02-08 ENCOUNTER — Other Ambulatory Visit: Payer: Self-pay | Admitting: Geriatric Medicine

## 2018-02-08 DIAGNOSIS — M545 Low back pain, unspecified: Secondary | ICD-10-CM

## 2018-02-08 DIAGNOSIS — S3992XA Unspecified injury of lower back, initial encounter: Secondary | ICD-10-CM | POA: Diagnosis not present

## 2018-02-09 ENCOUNTER — Encounter (HOSPITAL_COMMUNITY): Payer: Self-pay | Admitting: Emergency Medicine

## 2018-02-09 ENCOUNTER — Emergency Department (HOSPITAL_COMMUNITY): Payer: Medicare HMO

## 2018-02-09 ENCOUNTER — Emergency Department (HOSPITAL_COMMUNITY)
Admission: EM | Admit: 2018-02-09 | Discharge: 2018-02-09 | Disposition: A | Discharge status: Home / Self Care | Payer: Medicare HMO | Attending: Emergency Medicine | Admitting: Emergency Medicine

## 2018-02-09 DIAGNOSIS — K4090 Unilateral inguinal hernia, without obstruction or gangrene, not specified as recurrent: Secondary | ICD-10-CM | POA: Diagnosis not present

## 2018-02-09 DIAGNOSIS — K5901 Slow transit constipation: Secondary | ICD-10-CM

## 2018-02-09 DIAGNOSIS — N189 Chronic kidney disease, unspecified: Secondary | ICD-10-CM | POA: Diagnosis not present

## 2018-02-09 DIAGNOSIS — I700 Atherosclerosis of aorta: Secondary | ICD-10-CM | POA: Diagnosis not present

## 2018-02-09 DIAGNOSIS — I444 Left anterior fascicular block: Secondary | ICD-10-CM | POA: Diagnosis not present

## 2018-02-09 DIAGNOSIS — I7102 Dissection of abdominal aorta: Secondary | ICD-10-CM | POA: Diagnosis not present

## 2018-02-09 DIAGNOSIS — I129 Hypertensive chronic kidney disease with stage 1 through stage 4 chronic kidney disease, or unspecified chronic kidney disease: Secondary | ICD-10-CM | POA: Diagnosis not present

## 2018-02-09 DIAGNOSIS — K5900 Constipation, unspecified: Secondary | ICD-10-CM | POA: Diagnosis present

## 2018-02-09 DIAGNOSIS — Z79899 Other long term (current) drug therapy: Secondary | ICD-10-CM | POA: Insufficient documentation

## 2018-02-09 DIAGNOSIS — Z87891 Personal history of nicotine dependence: Secondary | ICD-10-CM | POA: Insufficient documentation

## 2018-02-09 LAB — COMPREHENSIVE METABOLIC PANEL
ALBUMIN: 4.1 g/dL (ref 3.5–5.0)
ALK PHOS: 63 U/L (ref 38–126)
ALT: 20 U/L (ref 0–44)
AST: 23 U/L (ref 15–41)
Anion gap: 9 (ref 5–15)
BUN: 11 mg/dL (ref 8–23)
CHLORIDE: 104 mmol/L (ref 98–111)
CO2: 29 mmol/L (ref 22–32)
Calcium: 9.5 mg/dL (ref 8.9–10.3)
Creatinine, Ser: 1.13 mg/dL (ref 0.61–1.24)
GFR calc non Af Amer: 58 mL/min — ABNORMAL LOW (ref >60)
GLUCOSE: 105 mg/dL — AB (ref 70–99)
Potassium: 4 mmol/L (ref 3.5–5.1)
SODIUM: 142 mmol/L (ref 135–145)
Total Bilirubin: 1.3 mg/dL — ABNORMAL HIGH (ref 0.3–1.2)
Total Protein: 6.7 g/dL (ref 6.5–8.1)

## 2018-02-09 LAB — CBC
HEMATOCRIT: 48 % (ref 39.0–52.0)
Hemoglobin: 15.9 g/dL (ref 13.0–17.0)
MCH: 32.1 pg (ref 26.0–34.0)
MCHC: 33.1 g/dL (ref 30.0–36.0)
MCV: 96.8 fL (ref 80.0–100.0)
NRBC: 0 % (ref 0.0–0.2)
Platelets: 202 10*3/uL (ref 150–400)
RBC: 4.96 MIL/uL (ref 4.22–5.81)
RDW: 13.2 % (ref 11.5–15.5)
WBC: 5.1 10*3/uL (ref 4.0–10.5)

## 2018-02-09 LAB — PROTIME-INR
INR: 3.92
Prothrombin Time: 38.1 seconds — ABNORMAL HIGH (ref 11.4–15.2)

## 2018-02-09 LAB — LIPASE, BLOOD: LIPASE: 38 U/L (ref 11–51)

## 2018-02-09 MED ORDER — SODIUM CHLORIDE 0.9 % IV BOLUS
1000.0000 mL | Freq: Once | INTRAVENOUS | Status: AC
  Administered 2018-02-09: 1000 mL via INTRAVENOUS

## 2018-02-09 MED ORDER — HERNIA BELT DOUBLE MEDIUM MISC: 1.0000 | Freq: Every day | 0 refills | Status: DC

## 2018-02-09 MED ORDER — IOHEXOL 300 MG/ML  SOLN
100.0000 mL | Freq: Once | INTRAMUSCULAR | Status: AC | PRN
  Administered 2018-02-09: 100 mL via INTRAVENOUS

## 2018-02-09 MED ORDER — HERNIA BELT DOUBLE MEDIUM MISC: 1.0000 | Freq: Every day | 0 refills | Status: AC

## 2018-02-09 NOTE — Discharge Instructions (Signed)
You were seen in the ER for inguinal hernia pain.   CT shows Extensive plaque in your aorta.  Follow up with cardiology regarding this. Abdominal hernia, likely chronic per radiologist.  Follow up with vascular surgery regarding this as soon as possible for further recommendations and interventions.  Small right and left inguinal hernias without complications. Do not lift anything over 15-20 pounds.  Miralax, stool softener, fiber, increase water intake to regular bowel movements. Follow up with general surgery for this. I have given you a prescription for this although you may just be able to walk into supply store and purchase one without a prescription  Return to the ER for fevers, chills, nausea, vomiting, worsening or localized abdominal pain, no bowel movement of passing gas for several days, chest pain, shortness of breath.

## 2018-02-09 NOTE — ED Provider Notes (Signed)
[facility] EMERGENCY DEPARTMENT Provider Note   CSN: [id number] Arrival date & time: 02/09/18  0431     History   Chief Complaint Chief Complaint  Patient presents with  . Constipation  . Hernia    HPI Donald Rasmussen is a 84 y.o. male with history of paroxysmal a-fib on warfarin, renal insufficiency, CAD, hyperlipidemia, hypertension, constipation, is here for evaluation of abdominal pain.  History is obtained by family at bedside.  Patient developed a left inguinal hernia 6 to 8 weeks ago.  Initially this was painless and did not bother him, self reduced on its own.  Initially evaluated by PCP who recommended surgical evaluation which is pending.  Patient developed intermittent, moderate, sharp pain to the area of the hernia 2 days ago.  Pain is worse with walking, coughing.  Pain resolves spontaneously and usually goes away when he is laying down.  Has noticed that hernia is not reducing as easily and it is twice as large.  Patient has not had a bowel movement and 2 to 3 days.  Denies associated fevers, chills, nausea, vomiting, urinary symptoms.  No previous abdominal surgeries.  Does not take any narcotic pain medicines, iron.  HPI  Past Medical History:  Diagnosis Date  . Cancer [facility])    prostate; Dr. Amalia Hailey  . Chronic kidney disease    Stg III kidney disease; GRF 55  . Chronic low back pain    2000 lumbosacral spine degenerative change. Possible calculus  . Complication of anesthesia   . Coronary artery disease    BMS 1999  . Edentulous   . Gout   . Hyperlipidemia   . Hypertension   . Impaired fasting blood sugar   . Mucous cyst of tonsil     Patient Active Problem List   Diagnosis Date Noted  . History of amiodarone therapy 08/12/2013  . Long term current use of anticoagulant therapy 08/12/2013  . Atrial fibrillation ([place]) 03/07/2012    Class: Chronic  . CAD (coronary artery disease), native coronary artery 03/07/2012    Class: Chronic  .  Essential hypertension 03/07/2012    Class: Chronic    Past Surgical History:  Procedure Laterality Date  . CARDIAC CATHETERIZATION     stent   . CARDIOVERSION  03/07/2012   Procedure: CARDIOVERSION;  Surgeon: Sinclair Grooms, MD;  Location: [facility];  Service: Cardiovascular;  Laterality: N/A;  . HEMORRHOID SURGERY    . RETROPUBIC PROSTATECTOMY  1997  . sebaceous cyst back          Home Medications    Prior to Admission medications   Medication Sig Start Date End Date Taking? Authorizing Provider  acetaminophen (TYLENOL) 500 MG tablet Take 500 mg by mouth every 6 (six) hours as needed (pain).     [provider]  allopurinol (ZYLOPRIM) 300 MG tablet Take 300 mg by mouth daily.    [provider]  amLODipine (NORVASC) 5 MG tablet Take 1 tablet (5 mg total) by mouth daily. 06/26/17   Belva Crome, MD  atorvastatin (LIPITOR) 20 MG tablet Take 1 tablet (20 mg total) by mouth daily. 05/07/15   Belva Crome, MD  cholecalciferol (VITAMIN D) 1000 UNITS tablet Take 2,000 Units by mouth daily.    [provider]  Elastic Bandages & Supports (HERNIA BELT DOUBLE MEDIUM) MISC 1 Package by Does not apply route daily. Lap belt for left and right hernias 02/09/18 03/11/18  Kinnie Feil, PA-C  EPINEPHrine (  EPIPEN JR) 0.15 MG/0.3ML injection Inject 0.15 mg into the muscle as needed for anaphylaxis.     [provider]  fish oil-omega-3 fatty acids 1000 MG capsule Take 1,200 mg by mouth daily.    [provider]  metoprolol tartrate (LOPRESSOR) 25 MG tablet Take 25 mg by mouth 2 (two) times daily.    [provider]  nitroGLYCERIN (NITROSTAT) 0.4 MG SL tablet Place 1 tablet (0.4 mg total) under the tongue every 5 (five) minutes as needed for chest pain. 06/04/15   Belva Crome, MD  quinapril (ACCUPRIL) 20 MG tablet Take 1 tablet (20 mg total) by mouth daily. 06/26/17   Belva Crome, MD  warfarin (COUMADIN) 1 MG tablet TAKE AS DIRECTED   BY  COUMADIN  CLINIC 01/16/18   Belva Crome, MD    Family History Family History  Problem Relation Age of Onset  . Hyperlipidemia Mother   . Heart attack Mother   . Heart attack Father     Social History Social History   Tobacco Use  . Smoking status: Former Research scientist (life sciences)  . Smokeless tobacco: Current User    Types: Chew  Substance Use Topics  . Alcohol use: Yes    Alcohol/week: 0.0 standard drinks  . Drug use: No     Allergies   Bee venom   Review of Systems Review of Systems  Gastrointestinal: Positive for abdominal pain and constipation.       Hernia   Hematological: Bruises/bleeds easily.  All other systems reviewed and are negative.    Physical Exam Updated Vital Signs BP (!) 156/92   Pulse 64   Temp (!) 97.4 F (36.3 C) (Oral)   Resp 15   SpO2 99%   Physical Exam  Constitutional: He is oriented to person, place, and time. He appears well-developed and well-nourished.  Non toxic.  HENT:  Head: Normocephalic and atraumatic.  Nose: Nose normal.  Eyes: Pupils are equal, round, and reactive to light. Conjunctivae and EOM are normal.  Neck: Normal range of motion.  Cardiovascular: Normal rate, regular rhythm and normal heart sounds.  No murmur heard. Pulmonary/Chest: Effort normal and breath sounds normal.  Abdominal: Soft. Bowel sounds are normal. There is tenderness. A hernia is present.  Small, left inguinal hernia noted, easily reducible with mild tenderness reported.  Hernia self reduces with lying flat.  No extension into scrotum.  No overlying skin changes.  Abdomen is soft, nondistended.  No G/R/R.  Negative Murphy's and McBurney's.  No CVA tenderness.  No suprapubic tenderness.  Musculoskeletal: Normal range of motion.  Neurological: He is alert and oriented to person, place, and time.  Skin: Skin is warm and dry. Capillary refill takes less than 2 seconds.  Psychiatric: He has a normal mood and affect. His behavior is normal. Judgment and thought  content normal.  Nursing note and vitals reviewed.    ED Treatments / Results  Labs (all labs ordered are listed, but only abnormal results are displayed) Labs Reviewed  COMPREHENSIVE METABOLIC PANEL - Abnormal; Notable for the following components:      Result Value   Glucose, Bld 105 (*)    Total Bilirubin 1.3 (*)    GFR calc non Af Amer 58 (*)    All other components within normal limits  PROTIME-INR - Abnormal; Notable for the following components:   Prothrombin Time 38.1 (*)    All other components within normal limits  LIPASE, BLOOD  CBC  URINALYSIS, ROUTINE W REFLEX MICROSCOPIC  EKG EKG Interpretation  Date/Time:  Friday February 09 2018 06:47:03 EDT Ventricular Rate:  75 PR Interval:    QRS Duration: 110 QT Interval:  423 QTC Calculation: 473 R Axis:   -68 Text Interpretation:  rhythm indeterminate Left anterior fascicular block Probable left ventricular hypertrophy Baseline wander in lead(s) V3 Confirmed by Ripley Fraise [phone]) on 02/09/2018 6:56:09 AM   Radiology Dg Lumbar Spine Complete  Result Date: 02/09/2018 CLINICAL DATA:  Chronic low back pain, possible recent fall. EXAM: LUMBAR SPINE - COMPLETE 4+ VIEW COMPARISON:  None. FINDINGS: There is no evidence of lumbar spine fracture. Degenerative joint changes with osteophyte formation and narrowed joint space are noted throughout lumbar spine. IMPRESSION: No acute fracture or dislocation. Osteoarthritic changes of lumbar spine. Electronically Signed   By: Abelardo Diesel M.D.   On: 02/09/2018 09:03   Ct Abdomen Pelvis W Contrast  Result Date: 02/09/2018 CLINICAL DATA:  Enlarged and painful hernia with constipation for 2-3 days. EXAM: CT ABDOMEN AND PELVIS WITH CONTRAST TECHNIQUE: Multidetector CT imaging of the abdomen and pelvis was performed using the standard protocol following bolus administration of intravenous contrast. CONTRAST:  169mL OMNIPAQUE IOHEXOL 300 MG/ML  SOLN COMPARISON:  None. FINDINGS:  Lower chest: Cardiomegaly and coronary atherosclerotic calcification. No acute finding. Hepatobiliary: No focal liver abnormality.No evidence of biliary obstruction or stone. Pancreas: Unremarkable. Spleen: Unremarkable. Adrenals/Urinary Tract: Negative adrenals. No hydronephrosis or stone. Bilateral simple appearing renal cortical cysts. Unremarkable bladder. Stomach/Bowel: No obstruction. No inflammatory changes. There is a right inguinal hernia containing appendiceal tip. Vascular/Lymphatic: No acute vascular abnormality. Extensive atherosclerosis. 2.6 cm dilatation of the aorta with displaced intimal calcification. There is no detected wall thickening, surrounding fat edema, or history to indicate acute dissection. No mass or adenopathy. Reproductive:Prostatectomy without nodularity in the surgical area. Other: Right inguinal hernia as noted above. Changes of left inguinal hernia repair Musculoskeletal: No acute abnormalities. Advanced degenerative disease of the lumbar spine. No evidence of aggressive bone lesion or acute finding. IMPRESSION: 1. No acute finding. 2. Right inguinal hernia containing noninflamed appendix. Probable left inguinal hernia repair without complicating feature. 3. Extensive atherosclerosis. Mid aortic dissection with limited extent, likely chronic. Electronically Signed   By: Monte Fantasia M.D.   On: 02/09/2018 09:05    Procedures Procedures (including critical care time)  Medications Ordered in ED Medications  sodium chloride 0.9 % bolus 1,000 mL (0 mLs Intravenous Stopped 02/09/18 0953)  iohexol (OMNIPAQUE) 300 MG/ML solution 100 mL (100 mLs Intravenous Contrast Given 02/09/18 0819)     Initial Impression / Assessment and Plan / ED Course  I have reviewed the triage vital signs and the nursing notes.  Pertinent labs & imaging results that were available during my care of the patient were reviewed by me and considered in my medical decision making (see chart for  details).  Clinical Course as of Feb 10 1008  Fri Feb 09, 2018  0910 IMPRESSION: 1. No acute finding. 2. Right inguinal hernia containing noninflamed appendix. Probable left inguinal hernia repair without complicating feature. 3. Extensive atherosclerosis. Mid aortic dissection with limited extent, likely chronic.  CT ABDOMEN PELVIS W CONTRAST [CG]  0933 Spoke to Dr Pascal Lux regarding CTAP read, explains L inguinal region looks barely thickened, if he had not known there was a reducible hernia he would have had a hard time calling finding a hernia.  This is likely bc pt's hernia self reduces with laying flat during Ct. No evidence of local complication in left inguinal canal.    [  CG]    Clinical Course User Index [CG] Kinnie Feil, [place]    0815: exam is reassuring, hernia is self reducible only mildly tender w/o skin changes.  He has no fever, n/v.  Given age, recent constipation, reported enlargement and new onset pain feel CTAP is reasonable to r/o hernia complication such as obstruction, strangulation.    1007: Unable to obtain urine, urine was discarded.  Patient has no urinary symptoms or suprapubic/CVA tenderness.  Lab work unremarkable.  CT A/P without acute findings.  Right inguinal hernia containing noninflamed appendix and left inguinal hernia repair without complications noted.  Additionally, incidental finding of extensive atherosclerosis and likely chronic mid aortic dissection.  I spoke to radiologist regarding these findings.  I had a lengthy discussion with patient and family at bedside regarding CT A/P findings.  Repeat abdominal exam is improved without any local inguinal or abdominal tenderness.  He is denying chest pain.  Pulses distally are symmetric.  At this time, patient is considered appropriate for discharge with close follow-up with vascular surgery, general surgery and cardiology for further discussion of findings today.  Discussed specific return precautions.   Patient and family are in agreement.  Will discharge with hernia truss/belt.  Patient discussed with Dr. Alvino Chapel who also evaluated the patient and agrees with ED work up and disposition.   Final Clinical Impressions(s) / ED Diagnoses   Final diagnoses:  Inguinal hernia of left side without obstruction or gangrene  Dissection of abdominal aorta (HCC)  Slow transit constipation  Atherosclerosis of aorta [facility])    ED Discharge Orders         Ordered    Elastic Bandages & Supports (HERNIA BELT DOUBLE MEDIUM) MISC  Daily,   Status:  Discontinued     02/09/18 1003    Elastic Bandages & Supports (HERNIA BELT DOUBLE MEDIUM) MISC  Daily     02/09/18 1003           Arlean Hopping 02/09/18 1009    Davonna Belling, MD 02/09/18 1543

## 2018-02-09 NOTE — ED Notes (Signed)
Patient transported to CT 

## 2018-02-09 NOTE — ED Triage Notes (Signed)
Pt reports his "hernia" has been causing him "all sorts of pain".  He states he is constipation, stating last bowel movement was three days ago.

## 2018-02-15 DIAGNOSIS — K4020 Bilateral inguinal hernia, without obstruction or gangrene, not specified as recurrent: Secondary | ICD-10-CM | POA: Diagnosis not present

## 2018-02-15 DIAGNOSIS — I482 Chronic atrial fibrillation, unspecified: Secondary | ICD-10-CM | POA: Diagnosis not present

## 2018-02-15 DIAGNOSIS — I10 Essential (primary) hypertension: Secondary | ICD-10-CM | POA: Diagnosis not present

## 2018-02-15 DIAGNOSIS — Z7901 Long term (current) use of anticoagulants: Secondary | ICD-10-CM | POA: Diagnosis not present

## 2018-02-21 ENCOUNTER — Ambulatory Visit (INDEPENDENT_AMBULATORY_CARE_PROVIDER_SITE_OTHER): Payer: Medicare HMO | Admitting: *Deleted

## 2018-02-21 DIAGNOSIS — I4891 Unspecified atrial fibrillation: Secondary | ICD-10-CM | POA: Diagnosis not present

## 2018-02-21 DIAGNOSIS — I482 Chronic atrial fibrillation, unspecified: Secondary | ICD-10-CM

## 2018-02-21 LAB — POCT INR: INR: 2.7 (ref 2.0–3.0)

## 2018-02-21 NOTE — Patient Instructions (Signed)
Description   Continue taking 1 tablet every day except 2 tablets on Mondays and Fridays.  Recheck in 3 weeks. Call if have any questions [phone]

## 2018-02-28 ENCOUNTER — Ambulatory Visit: Payer: Medicare HMO | Admitting: Physical Therapy

## 2018-03-13 DIAGNOSIS — Z23 Encounter for immunization: Secondary | ICD-10-CM | POA: Diagnosis not present

## 2018-03-13 DIAGNOSIS — I209 Angina pectoris, unspecified: Secondary | ICD-10-CM | POA: Diagnosis not present

## 2018-03-13 DIAGNOSIS — N183 Chronic kidney disease, stage 3 (moderate): Secondary | ICD-10-CM | POA: Diagnosis not present

## 2018-03-13 DIAGNOSIS — I4819 Other persistent atrial fibrillation: Secondary | ICD-10-CM | POA: Diagnosis not present

## 2018-03-13 DIAGNOSIS — I129 Hypertensive chronic kidney disease with stage 1 through stage 4 chronic kidney disease, or unspecified chronic kidney disease: Secondary | ICD-10-CM | POA: Diagnosis not present

## 2018-03-13 DIAGNOSIS — Z79899 Other long term (current) drug therapy: Secondary | ICD-10-CM | POA: Diagnosis not present

## 2018-03-14 ENCOUNTER — Ambulatory Visit (INDEPENDENT_AMBULATORY_CARE_PROVIDER_SITE_OTHER): Payer: Medicare HMO

## 2018-03-14 DIAGNOSIS — I482 Chronic atrial fibrillation, unspecified: Secondary | ICD-10-CM | POA: Diagnosis not present

## 2018-03-14 DIAGNOSIS — I4891 Unspecified atrial fibrillation: Secondary | ICD-10-CM | POA: Diagnosis not present

## 2018-03-14 LAB — POCT INR: INR: 3 (ref 2.0–3.0)

## 2018-03-14 NOTE — Patient Instructions (Signed)
Description   Continue taking 1 tablet every day except 2 tablets on Mondays and Fridays.  Recheck in 4 weeks. Call if have any questions [phone]

## 2018-03-20 DIAGNOSIS — B029 Zoster without complications: Secondary | ICD-10-CM | POA: Diagnosis not present

## 2018-03-23 ENCOUNTER — Encounter: Payer: Medicare HMO | Admitting: Vascular Surgery

## 2018-03-30 ENCOUNTER — Other Ambulatory Visit: Payer: Self-pay | Admitting: Interventional Cardiology

## 2018-04-11 ENCOUNTER — Ambulatory Visit (INDEPENDENT_AMBULATORY_CARE_PROVIDER_SITE_OTHER): Payer: Medicare HMO | Admitting: *Deleted

## 2018-04-11 DIAGNOSIS — I482 Chronic atrial fibrillation, unspecified: Secondary | ICD-10-CM

## 2018-04-11 DIAGNOSIS — I4891 Unspecified atrial fibrillation: Secondary | ICD-10-CM | POA: Diagnosis not present

## 2018-04-11 LAB — POCT INR: INR: 5.2 — AB (ref 2.0–3.0)

## 2018-04-11 NOTE — Patient Instructions (Signed)
Description   Skip tomorrow and Friday's dose, then start taking 1 tablet every day except 2 tablets on Mondays.   Recheck in  7-10 days. Call if have any questions [phone]

## 2018-05-09 ENCOUNTER — Ambulatory Visit (INDEPENDENT_AMBULATORY_CARE_PROVIDER_SITE_OTHER): Payer: Medicare HMO | Admitting: *Deleted

## 2018-05-09 DIAGNOSIS — I4891 Unspecified atrial fibrillation: Secondary | ICD-10-CM

## 2018-05-09 DIAGNOSIS — I482 Chronic atrial fibrillation, unspecified: Secondary | ICD-10-CM | POA: Diagnosis not present

## 2018-05-09 DIAGNOSIS — Z7901 Long term (current) use of anticoagulants: Secondary | ICD-10-CM

## 2018-05-09 LAB — POCT INR: INR: 4.3 — AB (ref 2.0–3.0)

## 2018-05-09 NOTE — Patient Instructions (Signed)
Description   Skip tomorrow's dose,  then start taking 1 tablet every day.   Recheck in  13 days. Call if have any questions [phone]

## 2018-05-22 ENCOUNTER — Ambulatory Visit (INDEPENDENT_AMBULATORY_CARE_PROVIDER_SITE_OTHER): Payer: Medicare HMO | Admitting: Pharmacist

## 2018-05-22 DIAGNOSIS — I4891 Unspecified atrial fibrillation: Secondary | ICD-10-CM

## 2018-05-22 DIAGNOSIS — I482 Chronic atrial fibrillation, unspecified: Secondary | ICD-10-CM | POA: Diagnosis not present

## 2018-05-22 LAB — POCT INR: INR: 5.1 — AB (ref 2.0–3.0)

## 2018-05-22 NOTE — Patient Instructions (Signed)
Description   Skip your Coumadin tomorrow and Thursday,  then start taking 1 tablet every day except 1/2 tablet on Mondays and Fridays.   Recheck INR in 1 week. Call if have any questions [phone]

## 2018-06-02 ENCOUNTER — Other Ambulatory Visit: Payer: Self-pay

## 2018-06-02 ENCOUNTER — Emergency Department (HOSPITAL_COMMUNITY)
Admission: EM | Admit: 2018-06-02 | Discharge: 2018-06-02 | Disposition: A | Discharge status: Home / Self Care | Payer: Medicare HMO | Attending: Emergency Medicine | Admitting: Emergency Medicine

## 2018-06-02 ENCOUNTER — Emergency Department (HOSPITAL_COMMUNITY): Payer: Medicare HMO

## 2018-06-02 DIAGNOSIS — R05 Cough: Secondary | ICD-10-CM | POA: Diagnosis present

## 2018-06-02 DIAGNOSIS — J069 Acute upper respiratory infection, unspecified: Secondary | ICD-10-CM | POA: Diagnosis not present

## 2018-06-02 DIAGNOSIS — Z8546 Personal history of malignant neoplasm of prostate: Secondary | ICD-10-CM | POA: Diagnosis not present

## 2018-06-02 DIAGNOSIS — I129 Hypertensive chronic kidney disease with stage 1 through stage 4 chronic kidney disease, or unspecified chronic kidney disease: Secondary | ICD-10-CM | POA: Insufficient documentation

## 2018-06-02 DIAGNOSIS — Z7901 Long term (current) use of anticoagulants: Secondary | ICD-10-CM | POA: Insufficient documentation

## 2018-06-02 DIAGNOSIS — M791 Myalgia, unspecified site: Secondary | ICD-10-CM | POA: Diagnosis not present

## 2018-06-02 DIAGNOSIS — B9789 Other viral agents as the cause of diseases classified elsewhere: Secondary | ICD-10-CM | POA: Diagnosis not present

## 2018-06-02 DIAGNOSIS — N183 Chronic kidney disease, stage 3 (moderate): Secondary | ICD-10-CM | POA: Insufficient documentation

## 2018-06-02 DIAGNOSIS — Z79899 Other long term (current) drug therapy: Secondary | ICD-10-CM | POA: Insufficient documentation

## 2018-06-02 DIAGNOSIS — R0981 Nasal congestion: Secondary | ICD-10-CM | POA: Diagnosis not present

## 2018-06-02 DIAGNOSIS — F17220 Nicotine dependence, chewing tobacco, uncomplicated: Secondary | ICD-10-CM | POA: Insufficient documentation

## 2018-06-02 DIAGNOSIS — J439 Emphysema, unspecified: Secondary | ICD-10-CM | POA: Diagnosis not present

## 2018-06-02 NOTE — ED Triage Notes (Signed)
Stepdaughter states that patient has been c./o  Back pain  and congestion ; pt denies any pain at this time

## 2018-06-02 NOTE — Discharge Instructions (Addendum)
There is no pneumonia on your chest x-ray today.  Continue hydration.  Follow-up with your primary care doctor.

## 2018-06-02 NOTE — ED Provider Notes (Signed)
[facility] EMERGENCY DEPARTMENT Provider Note   CSN: [id number] Arrival date & time: 06/02/18  1401     History   Chief Complaint Chief Complaint  Patient presents with  . Cough    HPI Donald Rasmussen is a 85 y.o. male.  The history is provided by the patient.  URI  Presenting symptoms: congestion and cough   Presenting symptoms: no ear pain, no fever and no sore throat   Severity:  Mild Onset quality:  Gradual Timing:  Constant Progression:  Unchanged Chronicity:  New Relieved by:  Nothing Worsened by:  Nothing Associated symptoms: myalgias   Associated symptoms: no arthralgias, no headaches, no neck pain, no sinus pain, no swollen glands and no wheezing   Risk factors: being elderly     Past Medical History:  Diagnosis Date  . Cancer [facility])    prostate; Dr. Amalia Hailey  . Chronic kidney disease    Stg III kidney disease; GRF 55  . Chronic low back pain    2000 lumbosacral spine degenerative change. Possible calculus  . Complication of anesthesia   . Coronary artery disease    BMS 1999  . Edentulous   . Gout   . Hyperlipidemia   . Hypertension   . Impaired fasting blood sugar   . Mucous cyst of tonsil     Patient Active Problem List   Diagnosis Date Noted  . History of amiodarone therapy 08/12/2013  . Long term current use of anticoagulant therapy 08/12/2013  . Atrial fibrillation ([place]) 03/07/2012    Class: Chronic  . CAD (coronary artery disease), native coronary artery 03/07/2012    Class: Chronic  . Essential hypertension 03/07/2012    Class: Chronic    Past Surgical History:  Procedure Laterality Date  . CARDIAC CATHETERIZATION     stent   . CARDIOVERSION  03/07/2012   Procedure: CARDIOVERSION;  Surgeon: Sinclair Grooms, MD;  Location: [facility];  Service: Cardiovascular;  Laterality: N/A;  . HEMORRHOID SURGERY    . RETROPUBIC PROSTATECTOMY  1997  . sebaceous cyst back          Home Medications    Prior to Admission  medications   Medication Sig Start Date End Date Taking? Authorizing Provider  acetaminophen (TYLENOL) 500 MG tablet Take 500 mg by mouth every 6 (six) hours as needed (pain).     [provider]  allopurinol (ZYLOPRIM) 300 MG tablet Take 300 mg by mouth daily.    [provider]  amLODipine (NORVASC) 5 MG tablet Take 1 tablet (5 mg total) by mouth daily. 04/04/18   Belva Crome, MD  atorvastatin (LIPITOR) 20 MG tablet Take 1 tablet (20 mg total) by mouth daily. 05/07/15   Belva Crome, MD  cholecalciferol (VITAMIN D) 1000 UNITS tablet Take 2,000 Units by mouth daily.    [provider]  EPINEPHrine (EPIPEN JR) 0.15 MG/0.3ML injection Inject 0.15 mg into the muscle as needed for anaphylaxis.     [provider]  fish oil-omega-3 fatty acids 1000 MG capsule Take 1,200 mg by mouth daily.    [provider]  metoprolol tartrate (LOPRESSOR) 25 MG tablet Take 25 mg by mouth 2 (two) times daily.    [provider]  nitroGLYCERIN (NITROSTAT) 0.4 MG SL tablet Place 1 tablet (0.4 mg total) under the tongue every 5 (five) minutes as needed for chest pain. 06/04/15   Belva Crome, MD  quinapril (ACCUPRIL) 20 MG tablet Take 1 tablet (  20 mg total) by mouth daily. 04/04/18   Belva Crome, MD  warfarin (COUMADIN) 1 MG tablet TAKE AS DIRECTED  BY  COUMADIN  CLINIC 01/16/18   Belva Crome, MD    Family History Family History  Problem Relation Age of Onset  . Hyperlipidemia Mother   . Heart attack Mother   . Heart attack Father     Social History Social History   Tobacco Use  . Smoking status: Former Research scientist (life sciences)  . Smokeless tobacco: Current User    Types: Chew  Substance Use Topics  . Alcohol use: Yes    Alcohol/week: 0.0 standard drinks  . Drug use: No     Allergies   Bee venom   Review of Systems Review of Systems  Constitutional: Negative for chills and fever.  HENT: Positive for congestion. Negative for ear pain, sinus pain and sore  throat.   Eyes: Negative for pain and visual disturbance.  Respiratory: Positive for cough. Negative for shortness of breath and wheezing.   Cardiovascular: Negative for chest pain and palpitations.  Gastrointestinal: Negative for abdominal pain and vomiting.  Genitourinary: Negative for dysuria and hematuria.  Musculoskeletal: Positive for myalgias. Negative for arthralgias, back pain and neck pain.  Skin: Negative for color change and rash.  Neurological: Negative for seizures, syncope and headaches.  All other systems reviewed and are negative.    Physical Exam Updated Vital Signs  ED Triage Vitals  Enc Vitals Group     BP 06/02/18 1405 (!) 161/74     Pulse Rate 06/02/18 1405 (!) 103     Resp 06/02/18 1405 16     Temp 06/02/18 1405 98.4 F (36.9 C)     Temp Source 06/02/18 1405 Oral     SpO2 06/02/18 1405 100 %     Weight --      Height --      Head Circumference --      Peak Flow --      Pain Score 06/02/18 1419 0     Pain Loc --      Pain Edu? --      Excl. in [place]? --     Physical Exam Vitals signs and nursing note reviewed.  Constitutional:      Appearance: He is well-developed.  HENT:     Head: Normocephalic and atraumatic.     Nose: Nose normal.     Mouth/Throat:     Mouth: Mucous membranes are dry.  Eyes:     Conjunctiva/sclera: Conjunctivae normal.  Neck:     Musculoskeletal: Normal range of motion and neck supple.  Cardiovascular:     Rate and Rhythm: Normal rate and regular rhythm.     Pulses: Normal pulses.     Heart sounds: Normal heart sounds. No murmur.  Pulmonary:     Effort: Pulmonary effort is normal. No respiratory distress.     Breath sounds: Normal breath sounds. No wheezing.  Abdominal:     General: There is no distension.     Palpations: Abdomen is soft.     Tenderness: There is no abdominal tenderness.  Musculoskeletal:     Right lower leg: No edema.     Left lower leg: No edema.  Skin:    General: Skin is warm and dry.      Capillary Refill: Capillary refill takes less than 2 seconds.  Neurological:     General: No focal deficit present.     Mental Status: He is alert.  Psychiatric:  Mood and Affect: Mood normal.      ED Treatments / Results  Labs (all labs ordered are listed, but only abnormal results are displayed) Labs Reviewed - No data to display  EKG None  Radiology Dg Chest 2 View  Result Date: 06/02/2018 CLINICAL DATA:  Back pain and congestion. EXAM: CHEST - 2 VIEW COMPARISON:  CT chest and PA and lateral chest 10/04/2016. FINDINGS: The lungs are clear. The chest is hyperexpanded. Heart size is upper normal. Aortic atherosclerosis is noted. No acute or focal bony abnormality. IMPRESSION: No acute disease. Atherosclerosis. Emphysema. Electronically Signed   By: Inge Rise M.D.   On: 06/02/2018 15:31    Procedures Procedures (including critical care time)  Medications Ordered in ED Medications - No data to display   Initial Impression / Assessment and Plan / ED Course  I have reviewed the triage vital signs and the nursing notes.  Pertinent labs & imaging results that were available during my care of the patient were reviewed by me and considered in my medical decision making (see chart for details).     RYOTA TREECE is an 85 year old male with history of CAD, hypertension, high cholesterol presents to the ED with cough, congestion.  Patient with unremarkable vitals.  No fever.  Patient with symptoms for the last several days.  Family members with similar symptoms.  Patient overall has no complaints.  His wife is currently a patient in the emergency department and the patient son wanted him to check in.  Patient was agreeable for chest x-ray.  He denies any chest pain, fever, sputum production.  Patient denies any shortness of breath.  Chest x-ray showed no signs of pneumonia, pneumothorax, pleural effusion.  He has clear breath sounds on exam.  Unremarkable vitals.  Likely  patient with viral process.  Discharged from ED in good condition.  Told to continue Tylenol, Motrin, hydration.  This chart was dictated using voice recognition software.  Despite best efforts to proofread,  errors can occur which can change the documentation meaning.    Final Clinical Impressions(s) / ED Diagnoses   Final diagnoses:  Viral upper respiratory tract infection    ED Discharge Orders    None       Lennice Sites, DO 06/02/18 1554

## 2018-06-02 NOTE — ED Notes (Signed)
Patient Alert and oriented to baseline. Stable and ambulatory to baseline. Patient verbalized understanding of the discharge instructions.  Patient belongings were taken by the patient.   

## 2018-06-05 ENCOUNTER — Ambulatory Visit (INDEPENDENT_AMBULATORY_CARE_PROVIDER_SITE_OTHER): Payer: 59

## 2018-06-05 DIAGNOSIS — I4891 Unspecified atrial fibrillation: Secondary | ICD-10-CM

## 2018-06-05 DIAGNOSIS — I482 Chronic atrial fibrillation, unspecified: Secondary | ICD-10-CM

## 2018-06-05 LAB — POCT INR: INR: 2.7 (ref 2.0–3.0)

## 2018-06-05 NOTE — Patient Instructions (Signed)
Please continue taking 1 tablet every day except 1/2 tablet on Mondays and Fridays.    Recheck INR in 3 weeks. Call if have any questions [phone]

## 2018-06-26 ENCOUNTER — Ambulatory Visit (INDEPENDENT_AMBULATORY_CARE_PROVIDER_SITE_OTHER): Payer: Medicare HMO | Admitting: Pharmacist

## 2018-06-26 DIAGNOSIS — I482 Chronic atrial fibrillation, unspecified: Secondary | ICD-10-CM | POA: Diagnosis not present

## 2018-06-26 DIAGNOSIS — I4891 Unspecified atrial fibrillation: Secondary | ICD-10-CM

## 2018-06-26 LAB — POCT INR: INR: 2.4 (ref 2.0–3.0)

## 2018-06-26 NOTE — Patient Instructions (Signed)
Please continue taking 1 tablet every day except 1/2 tablet on Mondays and Fridays.    Recheck INR in 3 weeks. Call if have any questions [phone]

## 2018-07-12 ENCOUNTER — Other Ambulatory Visit: Payer: Self-pay | Admitting: Interventional Cardiology

## 2018-07-12 DIAGNOSIS — Z7901 Long term (current) use of anticoagulants: Secondary | ICD-10-CM

## 2018-07-12 DIAGNOSIS — I480 Paroxysmal atrial fibrillation: Secondary | ICD-10-CM

## 2018-07-17 ENCOUNTER — Other Ambulatory Visit: Payer: Self-pay

## 2018-07-17 ENCOUNTER — Ambulatory Visit (INDEPENDENT_AMBULATORY_CARE_PROVIDER_SITE_OTHER): Payer: Medicare HMO | Admitting: *Deleted

## 2018-07-17 DIAGNOSIS — I482 Chronic atrial fibrillation, unspecified: Secondary | ICD-10-CM | POA: Diagnosis not present

## 2018-07-17 DIAGNOSIS — I4891 Unspecified atrial fibrillation: Secondary | ICD-10-CM | POA: Diagnosis not present

## 2018-07-17 LAB — POCT INR: INR: 1.9 — AB (ref 2.0–3.0)

## 2018-07-17 NOTE — Patient Instructions (Addendum)
Description   Today take 1.5 tablets then continue taking 1 tablet every day except 1/2 tablet on Mondays and Fridays. Recheck INR in 3 weeks. Call if have any questions [phone]

## 2018-08-13 ENCOUNTER — Telehealth: Payer: Self-pay | Admitting: Pharmacist

## 2018-08-13 NOTE — Telephone Encounter (Signed)

## 2018-08-14 ENCOUNTER — Ambulatory Visit (INDEPENDENT_AMBULATORY_CARE_PROVIDER_SITE_OTHER): Payer: Medicare HMO | Admitting: Pharmacist

## 2018-08-14 ENCOUNTER — Other Ambulatory Visit: Payer: Self-pay | Admitting: Interventional Cardiology

## 2018-08-14 ENCOUNTER — Other Ambulatory Visit: Payer: Self-pay

## 2018-08-14 DIAGNOSIS — I4891 Unspecified atrial fibrillation: Secondary | ICD-10-CM

## 2018-08-14 DIAGNOSIS — I482 Chronic atrial fibrillation, unspecified: Secondary | ICD-10-CM

## 2018-08-14 LAB — POCT INR: INR: 1.9 — AB (ref 2.0–3.0)

## 2018-08-14 MED ORDER — APIXABAN 5 MG PO TABS: 5.0000 mg | ORAL_TABLET | Freq: Two times a day (BID) | ORAL | 0 refills | Status: DC

## 2018-08-14 NOTE — Telephone Encounter (Signed)
New Message   Pt c/o medication issue:  1. Name of Medication: Eliquis   2. How are you currently taking this medication (dosage and times per day)? 5mg  twice a day  3. Are you having a reaction (difficulty breathing--STAT)? no  4. What is your medication issue? Patient states he needs the script sent to [facility] on [address].  States he had issues with the coupon to get medication and would like someone to call him.

## 2018-08-15 MED ORDER — APIXABAN 5 MG PO TABS: 5.0000 mg | ORAL_TABLET | Freq: Two times a day (BID) | ORAL | 0 refills | Status: DC

## 2018-08-30 ENCOUNTER — Telehealth: Payer: Self-pay | Admitting: Cardiology

## 2018-08-30 NOTE — Telephone Encounter (Signed)
*  CORRECTION--I spoke with patient's grandson, Konrad Dolores (per patient's verbal consent), not patient's son.

## 2018-08-30 NOTE — Telephone Encounter (Signed)
Spoke with patient's son who confirmed all demographics. Does not have a smart phone. He uses My Chart. Will have vitals ready for visit.

## 2018-08-31 NOTE — Progress Notes (Signed)
Virtual Visit via Telephone Note   This visit type was conducted due to national recommendations for restrictions regarding the COVID-19 Pandemic (e.g. social distancing) in an effort to limit this patient's exposure and mitigate transmission in our community.  Due to his co-morbid illnesses, this patient is at least at moderate risk for complications without adequate follow up.  This format is felt to be most appropriate for this patient at this time.  The patient did not have access to video technology/had technical difficulties with video requiring transitioning to audio format only (telephone).  All issues noted in this document were discussed and addressed.  No physical exam could be performed with this format.  Please refer to the patient's chart for his  consent to telehealth for [facility].   Date:  09/03/2018   ID:  Donald Rasmussen, DOB [birth year], MRN [id number]  Patient Location: Home Provider Location: Home  PCP:  Lajean Manes, MD  Cardiologist:  Sinclair Grooms, MD  Electrophysiologist:  None   Evaluation Performed:  Follow-Up Visit  Chief Complaint: Follow-up for atrial fibrillation, CAD and hypertension, seen for [name]  History of Present Illness:    Donald Rasmussen is a 85 y.o. male with a prior history of persistent atrial fibrillation, coronary artery disease with prior RCA stent (BMS-1999), hypertension, chronic anticoagulation therapy and CKD stage III.  Patient was last seen in the office 10/11/2017 as an acute visit for shortness of breath however, during the office visit he was unaware of why he was scheduled and did not recall ever having complaints of shortness of breath. There were no notes with discussion of correlating symptoms.  Prior to that he had been seen by [name] 06/21/2017 and was noted to be doing well with plans for annual follow-up.  Today, I spoke with Donald Rasmussen via telephone.  He is doing well without any specific complaints.  He denies  anginal symptoms, palpitations, shortness of breath, LE swelling, orthopnea, PND, dizziness or syncope.  Denies acute bleeding.  Denies recent fever, nausea or vomiting or cough.  Was recently transitioned off of been in therapy per pharmacy note to Eliquis 5 mg twice daily likely in the setting of COVID-19.  I have explained this transition to the patient.  He is very hard of hearing however has a daughter and wife in the background who is helping to explain.  Also has a grandson who helps with his care.  Reviewed he reports medication compliance.   The patient does not have symptoms concerning for COVID-19 infection (fever, chills, cough, or new shortness of breath).   Past Medical History:  Diagnosis Date  . [facility])    prostate; Dr. Amalia Hailey  . Chronic kidney disease    Stg III kidney disease; GRF 55  . Chronic low back pain    2000 lumbosacral spine degenerative change. Possible calculus  . Complication of anesthesia   . Coronary artery disease    BMS 1999  . Edentulous   . Gout   . Hyperlipidemia   . Hypertension   . Impaired fasting blood sugar   . Mucous cyst of tonsil    Past Surgical History:  Procedure Laterality Date  . CARDIAC CATHETERIZATION     stent   . CARDIOVERSION  03/07/2012   Procedure: CARDIOVERSION;  Surgeon: Sinclair Grooms, MD;  Location: [place];  Service: Cardiovascular;  Laterality: N/A;  . HEMORRHOID SURGERY    . RETROPUBIC PROSTATECTOMY  1997  .  sebaceous cyst back       Current Meds  Medication Sig  . acetaminophen (TYLENOL) 500 MG tablet Take 500 mg by mouth every 6 (six) hours as needed (pain).   Marland Kitchen allopurinol (ZYLOPRIM) 300 MG tablet Take 300 mg by mouth daily.  Marland Kitchen amLODipine (NORVASC) 5 MG tablet TAKE 1 TABLET EVERY DAY  . apixaban (ELIQUIS) 5 MG TABS tablet Take 1 tablet (5 mg total) by mouth 2 (two) times daily.  Marland Kitchen atorvastatin (LIPITOR) 20 MG tablet Take 1 tablet (20 mg total) by mouth daily.  . cholecalciferol (VITAMIN D) 1000  UNITS tablet Take 2,000 Units by mouth daily.  Marland Kitchen EPINEPHrine (EPIPEN JR) 0.15 MG/0.3ML injection Inject 0.15 mg into the muscle as needed for anaphylaxis.   . fish oil-omega-3 fatty acids 1000 MG capsule Take 1,200 mg by mouth daily.  . metoprolol tartrate (LOPRESSOR) 25 MG tablet Take 1 tablet (25 mg total) by mouth 2 (two) times daily.  . nitroGLYCERIN (NITROSTAT) 0.4 MG SL tablet Place 1 tablet (0.4 mg total) under the tongue every 5 (five) minutes as needed for chest pain.  Marland Kitchen quinapril (ACCUPRIL) 20 MG tablet TAKE 1 TABLET EVERY DAY    Allergies:   Bee venom   Social History   Tobacco Use  . Smoking status: Former Research scientist (life sciences)  . Smokeless tobacco: Current User    Types: Chew  Substance Use Topics  . Alcohol use: Yes    Alcohol/week: 0.0 standard drinks  . Drug use: No    Family Hx: The patient's family history includes Heart attack in his father and mother; Hyperlipidemia in his mother.  ROS:   Please see the history of present illness.     All other systems reviewed and are negative.  Prior CV studies:   The following studies were reviewed today:  Myoview Study Highlights from 10/2015   Nuclear stress EF: 47%. There was septal wall hypokinesis.  There was no ST segment deviation noted during stress.  This is a low risk study. Old basal inferior infarct noted. No ischemia identified.  Defect 1: There is a medium defect of mild severity present in the basal inferior location.  Findings consistent with prior myocardial infarction.  Candee Furbish, MD   Notes Recorded by Belva Crome, MD on 11/11/2015 at 6:43 PM Low risk study. No further evaluation needed unless symptoms continued to progress.  Labs/Other Tests and Data Reviewed:    EKG:  An ECG dated 02/09/2018 was personally reviewed today and demonstrated:  atrial fibrillation  Recent Labs: 02/09/2018: ALT 20; BUN 11; Creatinine, Ser 1.13; Hemoglobin 15.9; Platelets 202; Potassium 4.0; Sodium 142   Recent Lipid  Panel No results found for: CHOL, TRIG, HDL, CHOLHDL, LDLCALC, LDLDIRECT  Wt Readings from Last 3 Encounters:  09/03/18 175 lb (79.4 kg)  10/11/17 183 lb 12.8 oz (83.4 kg)  06/21/17 180 lb 9.6 oz (81.9 kg)    Objective:    Vital Signs:  BP (!) 145/81   Pulse 76   Ht 6\' 4"  (1.93 m)   Wt 175 lb (79.4 kg)   BMI 21.30 kg/m    VITAL SIGNS:  reviewed GEN:  no acute distress EYES:  sclerae anicteric, EOMI - Extraocular Movements Intact RESPIRATORY:  normal respiratory effort, symmetric expansion SKIN:  no rash, lesions or ulcers. MUSCULOSKELETAL:  no obvious deformities. NEURO:  alert and oriented x 3, no obvious focal deficit PSYCH:  normal affect  ASSESSMENT & PLAN:    1.  Persistent atrial fibrillation: -Rate controlled  with no complaints of palpitations>> HR 76 -Continue current regimen -Anticoagulated with Eliquis 5 mg twice daily  -Denies acute bleeding in stool or urine -Previously on Coumadin however this was discontinued 08/14/2018 per Coumadin clinic pharmacy -[id number] =4 (age, HTN, CAD)  2.  Hypertension: -Elevated today>>likely in the setting of completing home BP, 145/81 however patient reports normal home BPs are in the 120-130 SBP range -Encouraged to monitor with regular home checks  -Continue amlodipine 5, metoprolol 25  3.  CAD status post BMS to RCA 1999: -Denies anginal symptoms continue -Continue metoprolol 25, atorvastatin 20 -No ASA in the setting of chronic anticoagulation with Eliquis 5 mg twice daily   COVID-19 Education: The signs and symptoms of COVID-19 were discussed with the patient and how to seek care for testing (follow up with PCP or arrange E-visit).  The importance of social distancing was discussed today.  Time:   Today, I have spent 20 minutes with the patient with telehealth technology discussing the above problems.     Medication Adjustments/Labs and Tests Ordered: Current medicines are reviewed at length with the patient  today.  Concerns regarding medicines are outlined above.   Tests Ordered: No orders of the defined types were placed in this encounter.   Medication Changes: No orders of the defined types were placed in this encounter.   Disposition:  Follow up [name] in 6 months  Signed, Kathyrn Drown, NP  09/03/2018 11:45 AM    [place]

## 2018-09-03 ENCOUNTER — Encounter: Payer: Self-pay | Admitting: Cardiology

## 2018-09-03 ENCOUNTER — Telehealth (INDEPENDENT_AMBULATORY_CARE_PROVIDER_SITE_OTHER): Payer: Medicare HMO | Admitting: Cardiology

## 2018-09-03 ENCOUNTER — Other Ambulatory Visit: Payer: Self-pay

## 2018-09-03 VITALS — BP 145/81 | HR 76 | Ht 76.0 in | Wt 175.0 lb

## 2018-09-03 DIAGNOSIS — I482 Chronic atrial fibrillation, unspecified: Secondary | ICD-10-CM | POA: Diagnosis not present

## 2018-09-03 DIAGNOSIS — Z5181 Encounter for therapeutic drug level monitoring: Secondary | ICD-10-CM

## 2018-09-03 DIAGNOSIS — Z7901 Long term (current) use of anticoagulants: Secondary | ICD-10-CM

## 2018-09-03 DIAGNOSIS — I10 Essential (primary) hypertension: Secondary | ICD-10-CM

## 2018-09-03 DIAGNOSIS — I2510 Atherosclerotic heart disease of native coronary artery without angina pectoris: Secondary | ICD-10-CM

## 2018-09-03 NOTE — Patient Instructions (Signed)
Medication Instructions:  Your physician recommends that you continue on your current medications as directed. Please refer to the Current Medication list given to you today.  If you need a refill on your cardiac medications before your next appointment, please call your pharmacy.   Lab work: None ordered  If you have labs (blood work) drawn today and your tests are completely normal, you will receive your results only by: . MyChart Message (if you have MyChart) OR . A paper copy in the mail If you have any lab test that is abnormal or we need to change your treatment, we will call you to review the results.  Testing/Procedures: None ordered  Follow-Up: At [facility], you and your health needs are our priority.  As part of our continuing mission to provide you with exceptional heart care, we have created designated Provider Care Teams.  These Care Teams include your primary Cardiologist (physician) and Advanced Practice Providers (APPs -  Physician Assistants and Nurse Practitioners) who all work together to provide you with the care you need, when you need it. You will need a follow up appointment in 6 months.  Please call our office 2 months in advance to schedule this appointment.  You may see Henry W Smith III, MD or one of the following Advanced Practice Providers on your designated Care Team:   Lori Gerhardt, NP Laura Ingold, NP . Jill McDaniel, NP  Any Other Special Instructions Will Be Listed Below (If Applicable).    

## 2018-09-09 ENCOUNTER — Other Ambulatory Visit: Payer: Self-pay | Admitting: Interventional Cardiology

## 2018-09-12 DIAGNOSIS — N183 Chronic kidney disease, stage 3 (moderate): Secondary | ICD-10-CM | POA: Diagnosis not present

## 2018-09-12 DIAGNOSIS — I129 Hypertensive chronic kidney disease with stage 1 through stage 4 chronic kidney disease, or unspecified chronic kidney disease: Secondary | ICD-10-CM | POA: Diagnosis not present

## 2018-09-12 DIAGNOSIS — F0280 Dementia in other diseases classified elsewhere without behavioral disturbance: Secondary | ICD-10-CM | POA: Diagnosis not present

## 2018-09-12 DIAGNOSIS — Z1389 Encounter for screening for other disorder: Secondary | ICD-10-CM | POA: Diagnosis not present

## 2018-09-12 DIAGNOSIS — I700 Atherosclerosis of aorta: Secondary | ICD-10-CM | POA: Diagnosis not present

## 2018-09-12 DIAGNOSIS — R2681 Unsteadiness on feet: Secondary | ICD-10-CM | POA: Diagnosis not present

## 2018-09-12 DIAGNOSIS — G609 Hereditary and idiopathic neuropathy, unspecified: Secondary | ICD-10-CM | POA: Diagnosis not present

## 2018-09-12 DIAGNOSIS — E0590 Thyrotoxicosis, unspecified without thyrotoxic crisis or storm: Secondary | ICD-10-CM | POA: Diagnosis not present

## 2018-09-12 DIAGNOSIS — Z0000 Encounter for general adult medical examination without abnormal findings: Secondary | ICD-10-CM | POA: Diagnosis not present

## 2018-09-12 DIAGNOSIS — E7800 Pure hypercholesterolemia, unspecified: Secondary | ICD-10-CM | POA: Diagnosis not present

## 2018-09-12 DIAGNOSIS — I4819 Other persistent atrial fibrillation: Secondary | ICD-10-CM | POA: Diagnosis not present

## 2018-09-12 DIAGNOSIS — G301 Alzheimer's disease with late onset: Secondary | ICD-10-CM | POA: Diagnosis not present

## 2018-09-12 DIAGNOSIS — Z79899 Other long term (current) drug therapy: Secondary | ICD-10-CM | POA: Diagnosis not present

## 2018-09-19 ENCOUNTER — Telehealth: Payer: Self-pay | Admitting: Interventional Cardiology

## 2018-09-19 NOTE — Telephone Encounter (Signed)
Pt c/o swelling: STAT is pt has developed SOB within 24 hours  1) How much weight have you gained and in what time span? Right foot.  2) If swelling, where is the swelling located? Foot   3) Are you currently taking a fluid pill? No   4) Are you currently SOB? No   5) Do you have a log of your daily weights (if so, list)? No   6) Have you gained 3 pounds in a day or 5 pounds in a week? Not sure  Have you traveled recently? No

## 2018-09-19 NOTE — Telephone Encounter (Signed)
I spoke to the patient's grandson Donald Rasmussen who called because the patient's right foot is swollen.  After getting into the conversation, it was revealed that he had been stung by a bee on the right leg 2 days ago.    They did have concern over his medications, which he forgets to take.  He has some bruising from the Eliquis.  His BP today was 155/84 and he had not taken BP medication since Sunday.  The patient is asymptomatic and I told them to weigh him daily and report changes, if any Friday.  They verbalized understanding.

## 2018-09-25 ENCOUNTER — Telehealth: Payer: Self-pay | Admitting: Interventional Cardiology

## 2018-09-25 NOTE — Telephone Encounter (Signed)
Not sur what to do with this. Needs to see me or PCP

## 2018-09-25 NOTE — Telephone Encounter (Signed)
Spoke with pt and obtained permission to speak with grandson, Marcello Moores.  Yolanda Bonine states they called last week about swelling in pt's leg but they decided to watch and see how he did because he had been stung by a bee a day or two prior.  1-2 days after that conversation, pt developed a blister that was larger than a silver dollar on his right leg and it busted.  Now has a wound there that pt was cleaning with dirty socks and paper towels.  Grandson went yesterday and picked up triple antibiotic ointment and dressing to try to help with healing.  Pt still having swelling in right leg, redness and grandson states right leg isn't what he would call "hot" but it is definitely warmer than the left leg.  Left foot has slight swelling but not much at all.  Pt denies pain.  Gain is unsteady when he tries to walk on right leg.  Vitals today were 98.6 temp, 148/70, HR 70s.  Yolanda Bonine states ever since pt was switched from Warfarin to Eliquis "things haven't been right".  Advised I would send message to [name] for review but he needed to go ahead and reach out to PCP as well as this could be cellulitis.  Grandson agreeable with plan.

## 2018-09-25 NOTE — Telephone Encounter (Signed)
New Message    Pts grandson is calling and says the Pt has fluid in his legs, he says he developed  a blister and it popped. He says the wound is pretty bad and using antibiotic ointment. He is not sure if he should be seen No fever, no Bp Issue and not complaining of pain    Please call

## 2018-09-26 NOTE — Telephone Encounter (Signed)
Spoke with [name] this morning and he said pt needs to see PCP. Spoke with grandson and made him aware.  He states he reached out to them and left a message yesterday but hasn't heard back.  Advised if they haven't called by lunch to reach out again.  Grandson verbalized understanding.

## 2018-09-27 DIAGNOSIS — N183 Chronic kidney disease, stage 3 (moderate): Secondary | ICD-10-CM | POA: Diagnosis not present

## 2018-09-27 DIAGNOSIS — I2510 Atherosclerotic heart disease of native coronary artery without angina pectoris: Secondary | ICD-10-CM | POA: Diagnosis not present

## 2018-09-27 DIAGNOSIS — Z8546 Personal history of malignant neoplasm of prostate: Secondary | ICD-10-CM | POA: Diagnosis not present

## 2018-09-27 DIAGNOSIS — F0280 Dementia in other diseases classified elsewhere without behavioral disturbance: Secondary | ICD-10-CM | POA: Diagnosis not present

## 2018-09-27 DIAGNOSIS — I4819 Other persistent atrial fibrillation: Secondary | ICD-10-CM | POA: Diagnosis not present

## 2018-09-27 DIAGNOSIS — I129 Hypertensive chronic kidney disease with stage 1 through stage 4 chronic kidney disease, or unspecified chronic kidney disease: Secondary | ICD-10-CM | POA: Diagnosis not present

## 2018-09-27 DIAGNOSIS — G301 Alzheimer's disease with late onset: Secondary | ICD-10-CM | POA: Diagnosis not present

## 2018-09-27 DIAGNOSIS — I209 Angina pectoris, unspecified: Secondary | ICD-10-CM | POA: Diagnosis not present

## 2018-10-02 ENCOUNTER — Other Ambulatory Visit: Payer: Self-pay | Admitting: Interventional Cardiology

## 2018-10-02 MED ORDER — QUINAPRIL HCL 20 MG PO TABS: 20.0000 mg | ORAL_TABLET | Freq: Every day | ORAL | 3 refills | Status: DC

## 2018-10-02 MED ORDER — METOPROLOL TARTRATE 25 MG PO TABS: 25.0000 mg | ORAL_TABLET | Freq: Two times a day (BID) | ORAL | 3 refills | Status: DC

## 2018-10-02 MED ORDER — AMLODIPINE BESYLATE 5 MG PO TABS: 5.0000 mg | ORAL_TABLET | Freq: Every day | ORAL | 3 refills | Status: DC

## 2018-10-02 MED ORDER — APIXABAN 5 MG PO TABS: 5.0000 mg | ORAL_TABLET | Freq: Two times a day (BID) | ORAL | 5 refills | Status: DC

## 2018-10-02 NOTE — Telephone Encounter (Signed)
Pt's medications were sent to pt's pharmacy as requested. Confirmation received. Pt needs a refill on Eliquis. Please address

## 2018-10-02 NOTE — Telephone Encounter (Signed)
New Message            *STAT* If patient is at the pharmacy, call can be transferred to refill team.   1. Which medications need to be refilled? (please list name of each medication and dose if known) Amlodipine/Metoprolol/Quinapril/Eliquis  2. Which pharmacy/location (including street and city if local pharmacy) is medication to be sent to?[facility] [phone]  3. Do they need a 30 day or 90 day supply? [place]

## 2018-10-02 NOTE — Telephone Encounter (Signed)
Prescription refill request for Eliquis received.  Last office visit: 09-03-2018 [facility] medicine visit with Kathyrn Drown) Scr: 1.13 (02-09-2018) Age: 85 y.o. M Weight:83.4 kg (10-11-2017)

## 2018-10-03 ENCOUNTER — Telehealth: Payer: Self-pay | Admitting: Interventional Cardiology

## 2018-10-03 NOTE — Telephone Encounter (Signed)
New Message    Patients grandson is calling back about the swelling/fluid in patients legs. He said it looks like its weeping and it appears that another blister may be coming up. He has no fever and other vitals are good. Please call to discuss. The swelling is pretty much localized to the right leg.

## 2018-10-03 NOTE — Telephone Encounter (Signed)
Spoke with grandson, Konrad Dolores that I spoke with last week.  He states he did speak with [name] last week and they started pt on Keflex for cellulitis.  Pt's leg and the open area are looking better.  Swelling has improved but there is still some there.  Vitals look good.  Grandson just wanted to touch base and make sure pt didn't need a fluid pill to help with swelling.  Advised as long as symptoms are improving we would hold off until after antibiotics are completed as the swelling will likely continue to resolve as he finishes the regimen.  Advised if swelling started to worsen to please let us know.  Grandson verbalized understanding and was appreciative for call.

## 2018-10-26 DIAGNOSIS — F0280 Dementia in other diseases classified elsewhere without behavioral disturbance: Secondary | ICD-10-CM | POA: Diagnosis not present

## 2018-10-26 DIAGNOSIS — Z8546 Personal history of malignant neoplasm of prostate: Secondary | ICD-10-CM | POA: Diagnosis not present

## 2018-10-26 DIAGNOSIS — I129 Hypertensive chronic kidney disease with stage 1 through stage 4 chronic kidney disease, or unspecified chronic kidney disease: Secondary | ICD-10-CM | POA: Diagnosis not present

## 2018-10-26 DIAGNOSIS — I2510 Atherosclerotic heart disease of native coronary artery without angina pectoris: Secondary | ICD-10-CM | POA: Diagnosis not present

## 2018-10-26 DIAGNOSIS — N183 Chronic kidney disease, stage 3 (moderate): Secondary | ICD-10-CM | POA: Diagnosis not present

## 2018-10-26 DIAGNOSIS — I209 Angina pectoris, unspecified: Secondary | ICD-10-CM | POA: Diagnosis not present

## 2018-10-26 DIAGNOSIS — G301 Alzheimer's disease with late onset: Secondary | ICD-10-CM | POA: Diagnosis not present

## 2018-10-26 DIAGNOSIS — I4819 Other persistent atrial fibrillation: Secondary | ICD-10-CM | POA: Diagnosis not present

## 2018-11-20 DIAGNOSIS — G301 Alzheimer's disease with late onset: Secondary | ICD-10-CM | POA: Diagnosis not present

## 2018-11-20 DIAGNOSIS — N183 Chronic kidney disease, stage 3 (moderate): Secondary | ICD-10-CM | POA: Diagnosis not present

## 2018-11-20 DIAGNOSIS — I2510 Atherosclerotic heart disease of native coronary artery without angina pectoris: Secondary | ICD-10-CM | POA: Diagnosis not present

## 2018-11-20 DIAGNOSIS — Z8546 Personal history of malignant neoplasm of prostate: Secondary | ICD-10-CM | POA: Diagnosis not present

## 2018-11-20 DIAGNOSIS — I209 Angina pectoris, unspecified: Secondary | ICD-10-CM | POA: Diagnosis not present

## 2018-11-20 DIAGNOSIS — I4819 Other persistent atrial fibrillation: Secondary | ICD-10-CM | POA: Diagnosis not present

## 2018-11-20 DIAGNOSIS — I129 Hypertensive chronic kidney disease with stage 1 through stage 4 chronic kidney disease, or unspecified chronic kidney disease: Secondary | ICD-10-CM | POA: Diagnosis not present

## 2018-11-20 DIAGNOSIS — F0280 Dementia in other diseases classified elsewhere without behavioral disturbance: Secondary | ICD-10-CM | POA: Diagnosis not present

## 2018-12-18 DIAGNOSIS — I209 Angina pectoris, unspecified: Secondary | ICD-10-CM | POA: Diagnosis not present

## 2018-12-18 DIAGNOSIS — I4819 Other persistent atrial fibrillation: Secondary | ICD-10-CM | POA: Diagnosis not present

## 2018-12-18 DIAGNOSIS — N183 Chronic kidney disease, stage 3 (moderate): Secondary | ICD-10-CM | POA: Diagnosis not present

## 2018-12-18 DIAGNOSIS — Z8546 Personal history of malignant neoplasm of prostate: Secondary | ICD-10-CM | POA: Diagnosis not present

## 2018-12-18 DIAGNOSIS — F0280 Dementia in other diseases classified elsewhere without behavioral disturbance: Secondary | ICD-10-CM | POA: Diagnosis not present

## 2018-12-18 DIAGNOSIS — I2510 Atherosclerotic heart disease of native coronary artery without angina pectoris: Secondary | ICD-10-CM | POA: Diagnosis not present

## 2018-12-18 DIAGNOSIS — G301 Alzheimer's disease with late onset: Secondary | ICD-10-CM | POA: Diagnosis not present

## 2018-12-18 DIAGNOSIS — I129 Hypertensive chronic kidney disease with stage 1 through stage 4 chronic kidney disease, or unspecified chronic kidney disease: Secondary | ICD-10-CM | POA: Diagnosis not present

## 2018-12-24 DIAGNOSIS — I129 Hypertensive chronic kidney disease with stage 1 through stage 4 chronic kidney disease, or unspecified chronic kidney disease: Secondary | ICD-10-CM | POA: Diagnosis not present

## 2018-12-24 DIAGNOSIS — I4819 Other persistent atrial fibrillation: Secondary | ICD-10-CM | POA: Diagnosis not present

## 2018-12-24 DIAGNOSIS — D6869 Other thrombophilia: Secondary | ICD-10-CM | POA: Diagnosis not present

## 2018-12-24 DIAGNOSIS — N183 Chronic kidney disease, stage 3 (moderate): Secondary | ICD-10-CM | POA: Diagnosis not present

## 2018-12-24 DIAGNOSIS — G301 Alzheimer's disease with late onset: Secondary | ICD-10-CM | POA: Diagnosis not present

## 2019-01-21 DIAGNOSIS — Z8546 Personal history of malignant neoplasm of prostate: Secondary | ICD-10-CM | POA: Diagnosis not present

## 2019-01-21 DIAGNOSIS — G301 Alzheimer's disease with late onset: Secondary | ICD-10-CM | POA: Diagnosis not present

## 2019-01-21 DIAGNOSIS — N183 Chronic kidney disease, stage 3 (moderate): Secondary | ICD-10-CM | POA: Diagnosis not present

## 2019-01-21 DIAGNOSIS — I2510 Atherosclerotic heart disease of native coronary artery without angina pectoris: Secondary | ICD-10-CM | POA: Diagnosis not present

## 2019-01-21 DIAGNOSIS — I209 Angina pectoris, unspecified: Secondary | ICD-10-CM | POA: Diagnosis not present

## 2019-01-21 DIAGNOSIS — I129 Hypertensive chronic kidney disease with stage 1 through stage 4 chronic kidney disease, or unspecified chronic kidney disease: Secondary | ICD-10-CM | POA: Diagnosis not present

## 2019-01-21 DIAGNOSIS — F0280 Dementia in other diseases classified elsewhere without behavioral disturbance: Secondary | ICD-10-CM | POA: Diagnosis not present

## 2019-01-21 DIAGNOSIS — I4819 Other persistent atrial fibrillation: Secondary | ICD-10-CM | POA: Diagnosis not present

## 2019-01-28 IMAGING — CT CT CHEST W/ CM
2 of 4 series · 15 of 36 positions shown, 18 images · IV contrast (iopamidol)
Comparison: None.

CLINICAL DATA: Status post fall.  Pain along the left-sided ribs.

EXAM:
CT CHEST WITH CONTRAST
TECHNIQUE: Multidetector CT imaging of the chest was performed during
intravenous contrast administration.
CONTRAST:  [id number]-677 IOPAMIDOL (5R2XSJ-677) INJECTION 61%

[Series 3: thorax 2.0 i31f 2 · axial · 0.67mm/px · z∈[-258,+50]mm · 12 of 183 slices shown, 15 images]
[im 15/183  mediastinal]
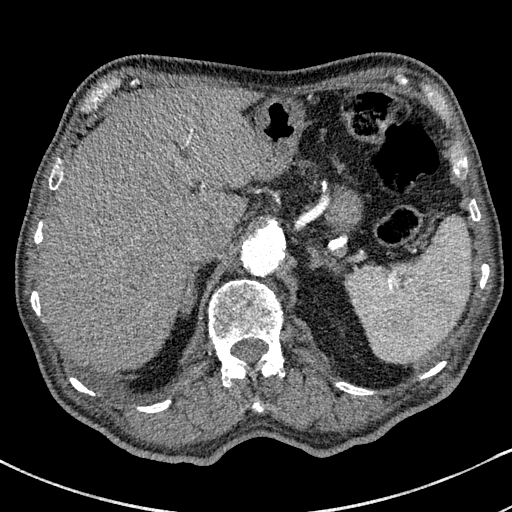
[im 15/183  lung]
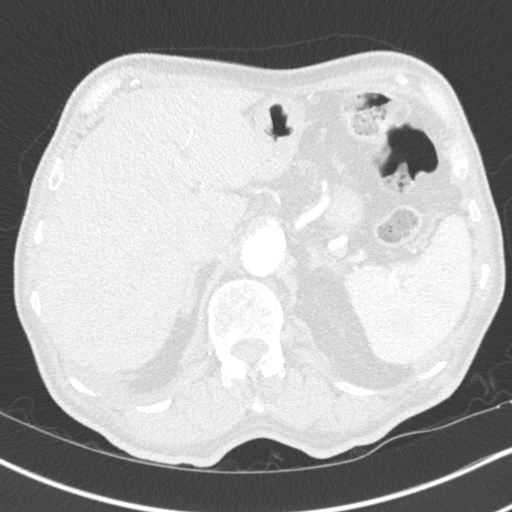
[im 29/183  lung]
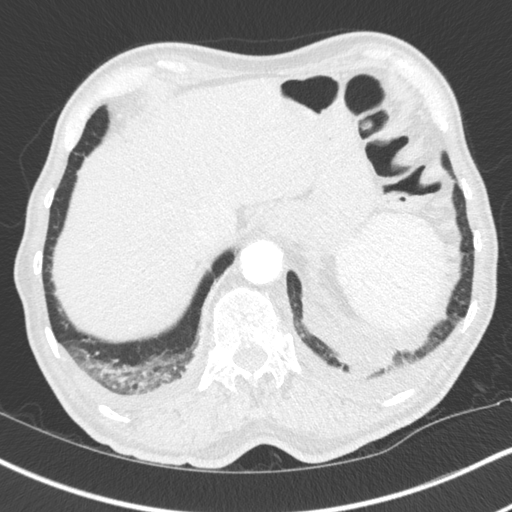
[im 43/183  lung]
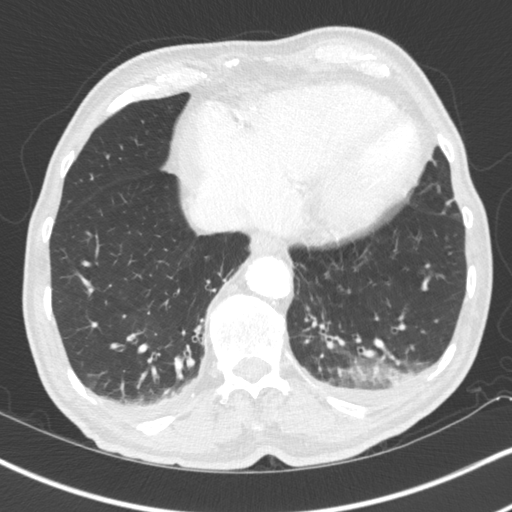
[im 57/183  lung]
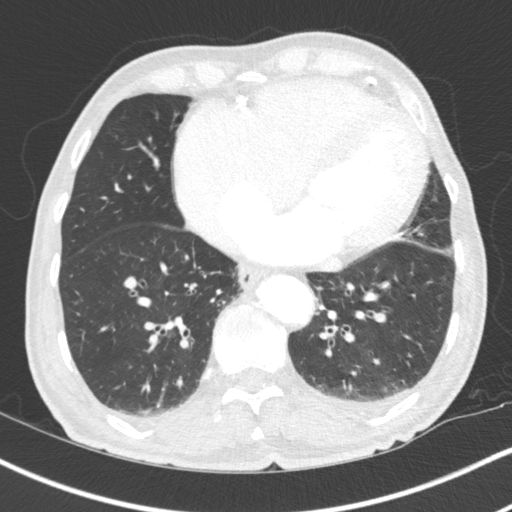
[im 71/183  mediastinal]
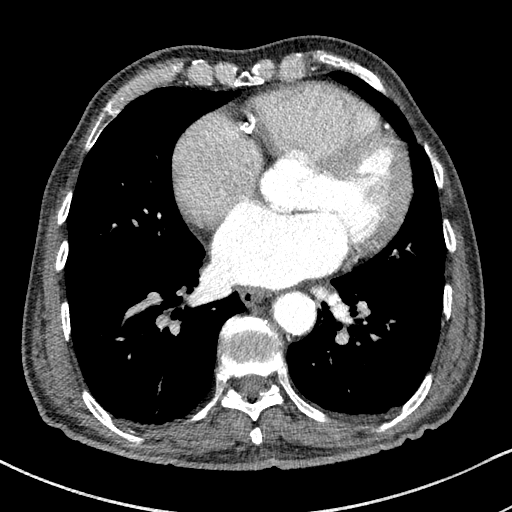
[im 71/183  lung]
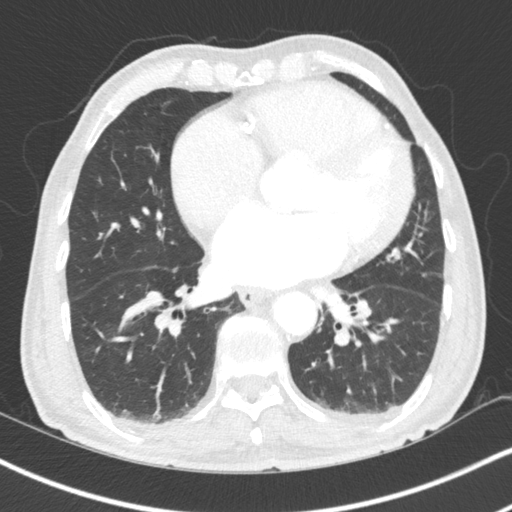
[im 85/183  lung]
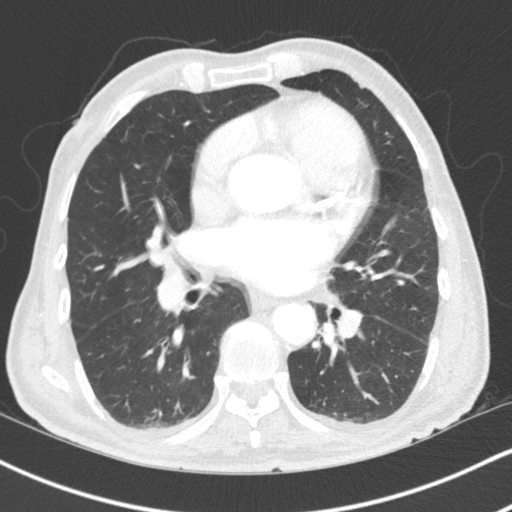
[im 99/183  lung]
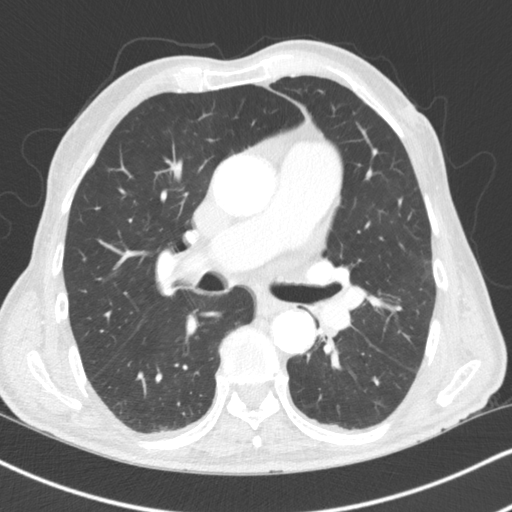
[im 113/183  lung]
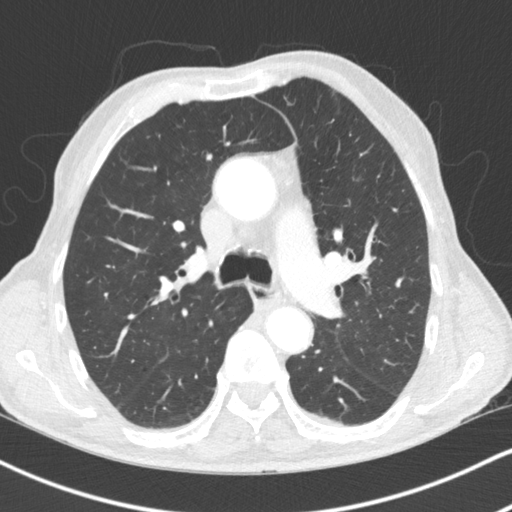
[im 127/183  mediastinal]
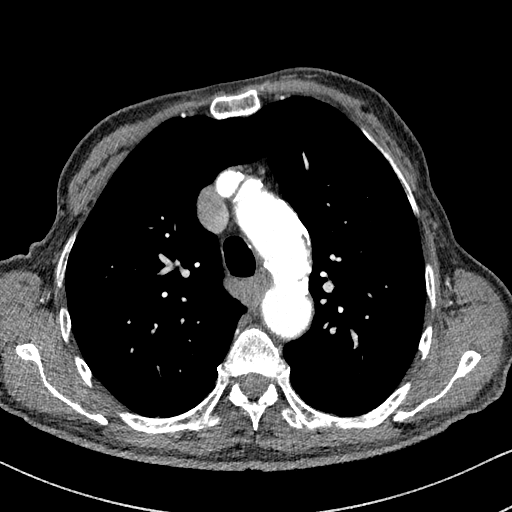
[im 127/183  lung]
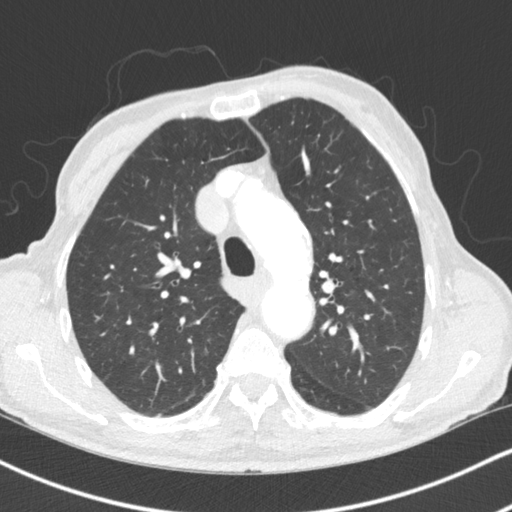
[im 141/183  lung]
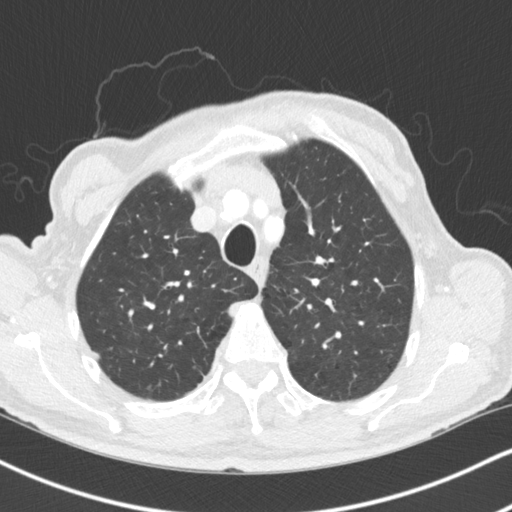
[im 155/183  lung]
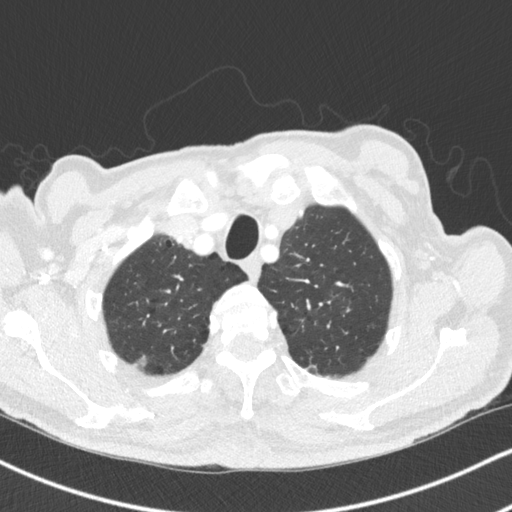
[im 169/183  lung]
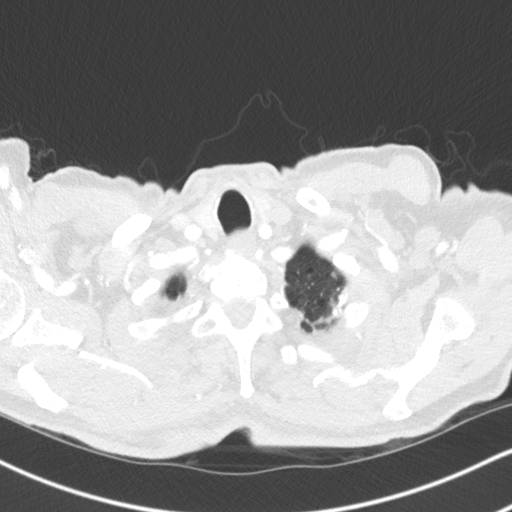

[Series 5: coronal · coronal · 0.63mm/px · 3 of 144 slices shown]
[im 29/144  lung]
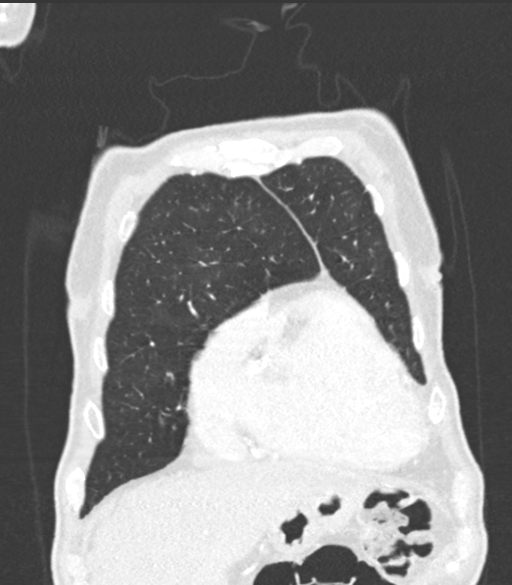
[im 58/144  lung]
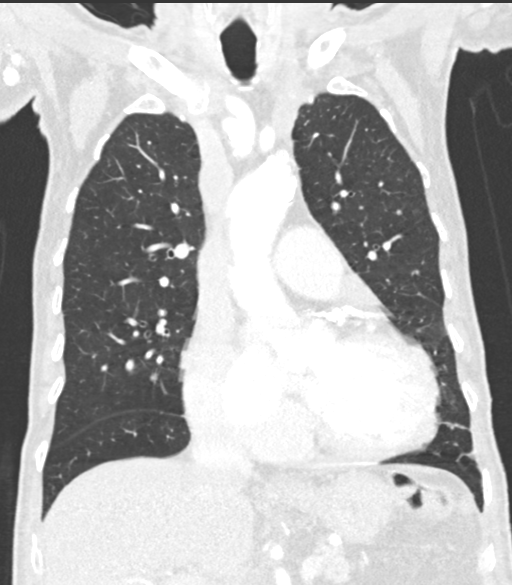
[im 86/144  lung]
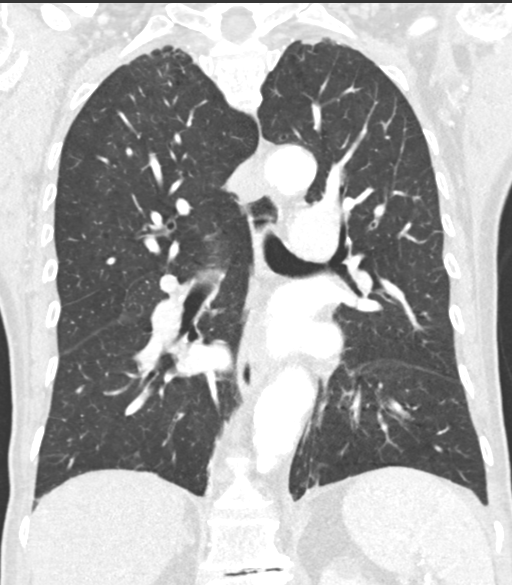

[15 of 36 positions shown; findings below may reference images not displayed]

FINDINGS: Cardiovascular: No significant vascular findings. Mild cardiomegaly.
Coronary artery atherosclerosis in the left main, LAD, circumflex
and RCA. No pericardial effusion. Normal caliber thoracic aorta.
Thoracic aortic atherosclerosis.

Mediastinum/Nodes: No enlarged mediastinal, hilar, or axillary lymph
nodes. Thyroid gland, trachea, and esophagus demonstrate no
significant findings.

Lungs/Pleura: Small bilateral pleural effusions. Mild bibasilar
atelectasis. No focal consolidation. No pneumothorax.

Upper Abdomen: No acute abnormality.

Musculoskeletal: No acute osseous abnormality. No lytic or sclerotic
osseous lesion. Degenerative disc disease with disc height loss at
L1-2.
IMPRESSION: 1. Small bilateral pleural effusions.
2. Multi vessel coronary artery atherosclerosis.
3.  Aortic Atherosclerosis (D9HT3-170.0)

## 2019-03-01 DIAGNOSIS — E7800 Pure hypercholesterolemia, unspecified: Secondary | ICD-10-CM | POA: Diagnosis not present

## 2019-03-01 DIAGNOSIS — I2510 Atherosclerotic heart disease of native coronary artery without angina pectoris: Secondary | ICD-10-CM | POA: Diagnosis not present

## 2019-03-01 DIAGNOSIS — I209 Angina pectoris, unspecified: Secondary | ICD-10-CM | POA: Diagnosis not present

## 2019-03-01 DIAGNOSIS — I4819 Other persistent atrial fibrillation: Secondary | ICD-10-CM | POA: Diagnosis not present

## 2019-03-01 DIAGNOSIS — I129 Hypertensive chronic kidney disease with stage 1 through stage 4 chronic kidney disease, or unspecified chronic kidney disease: Secondary | ICD-10-CM | POA: Diagnosis not present

## 2019-03-01 DIAGNOSIS — F0280 Dementia in other diseases classified elsewhere without behavioral disturbance: Secondary | ICD-10-CM | POA: Diagnosis not present

## 2019-03-01 DIAGNOSIS — Z8546 Personal history of malignant neoplasm of prostate: Secondary | ICD-10-CM | POA: Diagnosis not present

## 2019-03-01 DIAGNOSIS — G301 Alzheimer's disease with late onset: Secondary | ICD-10-CM | POA: Diagnosis not present

## 2019-03-01 DIAGNOSIS — N1831 Chronic kidney disease, stage 3a: Secondary | ICD-10-CM | POA: Diagnosis not present

## 2019-03-22 ENCOUNTER — Telehealth: Payer: Self-pay | Admitting: Interventional Cardiology

## 2019-03-22 NOTE — Telephone Encounter (Signed)
Patient calling the office for samples of medication:   1.  What medication and dosage are you requesting samples for? apixaban (ELIQUIS) 5 MG TABS tablet  2.  Are you currently out of this medication? Yes    

## 2019-03-22 NOTE — Telephone Encounter (Addendum)
**Note De-Identified  Obfuscation** Donald Rasmussen states that he is the pts grandson and his power of attorney (no mention of this in the pts chart and is not named as one of the pts contacts) but that he is nowhere near his grandfather so he cannot bring him to the phone to give verbal permission for me to s/w him concerning his care nor can he give me a phone number to call the pt or have the pt call me(?).    He c/o that staff members have been talking to him for 3 years concerning the pts switch to Eliquis and scheduling who allow him to make and cancel the pts appts.   Donald Rasmussen is a difficult person to talk to as he spoke over me through out this entire conversation. I explained Multiple times that I cannot s/w him concerning the pts care as there is a DPR in the pt s chart from 01/29/2016 as follows:  DPR SIGNED AND SCANNED FOR [facility] OFFICE. PT GIVES PERMISSION TO RELEASE INFO TO NOBODY BUT SELF. AND DOES NOT WANT ANY MESSAGES LEFT ON ANY PHONE  I did advise him that he or the pt could purchase enough Eliquis tablets to last until the pt could call and give permission over the phone for [place] to discuss with Donald Rasmussen.  Donald Rasmussen continued to talk and ask questions after being told many times that I cannot discuss the pts care with him due to HIPAA regulations so I ended the call.

## 2019-03-22 NOTE — Telephone Encounter (Signed)
Pt's grandson calling stating that pt is unable to get his Eliquis, but because the grandson is not on pt's DPR, I explained to the grandson that I was unable to speak with him concerning the pt's information and asked if the pt was there to give permission to speak with him, but pt was not. Could you advised on this matter to see if there is any help for pt to be able to get his medication. Please advise thanks

## 2019-03-26 DIAGNOSIS — N1831 Chronic kidney disease, stage 3a: Secondary | ICD-10-CM | POA: Diagnosis not present

## 2019-03-26 DIAGNOSIS — E7800 Pure hypercholesterolemia, unspecified: Secondary | ICD-10-CM | POA: Diagnosis not present

## 2019-03-26 DIAGNOSIS — I4819 Other persistent atrial fibrillation: Secondary | ICD-10-CM | POA: Diagnosis not present

## 2019-03-26 DIAGNOSIS — I2510 Atherosclerotic heart disease of native coronary artery without angina pectoris: Secondary | ICD-10-CM | POA: Diagnosis not present

## 2019-03-26 DIAGNOSIS — I129 Hypertensive chronic kidney disease with stage 1 through stage 4 chronic kidney disease, or unspecified chronic kidney disease: Secondary | ICD-10-CM | POA: Diagnosis not present

## 2019-03-26 DIAGNOSIS — I209 Angina pectoris, unspecified: Secondary | ICD-10-CM | POA: Diagnosis not present

## 2019-03-26 DIAGNOSIS — Z8546 Personal history of malignant neoplasm of prostate: Secondary | ICD-10-CM | POA: Diagnosis not present

## 2019-03-26 DIAGNOSIS — F0280 Dementia in other diseases classified elsewhere without behavioral disturbance: Secondary | ICD-10-CM | POA: Diagnosis not present

## 2019-03-26 DIAGNOSIS — G301 Alzheimer's disease with late onset: Secondary | ICD-10-CM | POA: Diagnosis not present

## 2019-03-26 NOTE — Telephone Encounter (Signed)
Called and spoke with grandson.  He states they had PCP send over prescription and were able to get samples from their office.  He states they gave him a month supply and will be helping with pt assistance.  Advised him to fax over [place] papers so there will be no more issues with speaking with him.  He was driving so I told him I would call back and leave the fax number on his VM.  Advised if he needed further assistance from our office in regards to Eliquis, please contact the office.  Grandson thanked me for the call.

## 2019-04-23 DIAGNOSIS — G301 Alzheimer's disease with late onset: Secondary | ICD-10-CM | POA: Diagnosis not present

## 2019-04-23 DIAGNOSIS — I209 Angina pectoris, unspecified: Secondary | ICD-10-CM | POA: Diagnosis not present

## 2019-04-23 DIAGNOSIS — E7800 Pure hypercholesterolemia, unspecified: Secondary | ICD-10-CM | POA: Diagnosis not present

## 2019-04-23 DIAGNOSIS — Z8546 Personal history of malignant neoplasm of prostate: Secondary | ICD-10-CM | POA: Diagnosis not present

## 2019-04-23 DIAGNOSIS — I129 Hypertensive chronic kidney disease with stage 1 through stage 4 chronic kidney disease, or unspecified chronic kidney disease: Secondary | ICD-10-CM | POA: Diagnosis not present

## 2019-04-23 DIAGNOSIS — I2510 Atherosclerotic heart disease of native coronary artery without angina pectoris: Secondary | ICD-10-CM | POA: Diagnosis not present

## 2019-04-23 DIAGNOSIS — F0280 Dementia in other diseases classified elsewhere without behavioral disturbance: Secondary | ICD-10-CM | POA: Diagnosis not present

## 2019-04-23 DIAGNOSIS — I4819 Other persistent atrial fibrillation: Secondary | ICD-10-CM | POA: Diagnosis not present

## 2019-04-23 DIAGNOSIS — N183 Chronic kidney disease, stage 3 unspecified: Secondary | ICD-10-CM | POA: Diagnosis not present

## 2019-05-07 ENCOUNTER — Telehealth: Payer: Self-pay | Admitting: Interventional Cardiology

## 2019-05-07 NOTE — Telephone Encounter (Signed)
Spoke with grandson.  He did not have weights, BPs or HRs.  States swelling is in both legs. He does have wounds on right leg.  Had these previously and they contacted PCP and they did a round of antibiotics and dressings and the wounds resolved but are back now.  States over the last couple of months pt has had issues with swelling but has resolved with elevation.  Now they aren't resolving even with elevation.  Yolanda Bonine is going to pt's home in a little bit and will get vitals for [place].  Advised I will call back to get this information.  Grandson appreciative for call.

## 2019-05-07 NOTE — Telephone Encounter (Signed)
Pt c/o swelling: STAT is pt has developed SOB within 24 hours  1) How much weight have you gained and in what time span? Unsure   2) If swelling, where is the swelling located? Below the knee  3) Are you currently taking a fluid pill? No   4) Are you currently SOB? No   5) Do you have a log of your daily weights (if so, list)? No   6) Have you gained 3 pounds in a day or 5 pounds in a week? Unsure   7) Have you traveled recently? No   Grandson Stacie Acres called, patient does have appt on 1/25, he is wondering if patient needs to be seen sooner than 1/25.

## 2019-05-07 NOTE — Telephone Encounter (Signed)
Spoke with grandson and pt's BP was 152/82, HR 72.  He states this is usually about where pt's vitals stay.  Admitted that pt occasionally misses second dose of Metoprolol. Scheduled pt to see [name] 05/10/2019 to eval.  Grandson appreciative for call. Family member will need to assist pt.  Yolanda Bonine states that he is sending a copy of the pt's POA papers with him so we can place a copy on file.

## 2019-05-09 ENCOUNTER — Other Ambulatory Visit: Payer: Self-pay

## 2019-05-09 ENCOUNTER — Ambulatory Visit: Payer: Medicare HMO | Admitting: Cardiology

## 2019-05-09 ENCOUNTER — Encounter: Payer: Self-pay | Admitting: Cardiology

## 2019-05-09 VITALS — BP 112/78 | HR 53 | Ht 76.0 in | Wt 200.1 lb

## 2019-05-09 DIAGNOSIS — I482 Chronic atrial fibrillation, unspecified: Secondary | ICD-10-CM

## 2019-05-09 DIAGNOSIS — I2510 Atherosclerotic heart disease of native coronary artery without angina pectoris: Secondary | ICD-10-CM | POA: Diagnosis not present

## 2019-05-09 DIAGNOSIS — R0602 Shortness of breath: Secondary | ICD-10-CM

## 2019-05-09 DIAGNOSIS — I10 Essential (primary) hypertension: Secondary | ICD-10-CM

## 2019-05-09 DIAGNOSIS — M7989 Other specified soft tissue disorders: Secondary | ICD-10-CM

## 2019-05-09 MED ORDER — METOPROLOL TARTRATE 25 MG PO TABS: 12.5000 mg | ORAL_TABLET | Freq: Two times a day (BID) | ORAL | 3 refills | Status: DC

## 2019-05-09 MED ORDER — FUROSEMIDE 20 MG PO TABS: 20.0000 mg | ORAL_TABLET | Freq: Every day | ORAL | 3 refills | Status: DC

## 2019-05-09 NOTE — Patient Instructions (Addendum)
Medication Instructions:  1) Decrease Metoprolol to 12.5 mg twice a day   2) Start Lasix 20 mg once a day   *If you need a refill on your cardiac medications before your next appointment, please call your pharmacy*  Lab Work: Bmet & Pro BNP- Today   If you have labs (blood work) drawn today and your tests are completely normal, you will receive your results only by: Marland Kitchen MyChart Message (if you have MyChart) OR . A paper copy in the mail If you have any lab test that is abnormal or we need to change your treatment, we will call you to review the results.  Testing/Procedures: Your physician has requested that you have an echocardiogram. Echocardiography is a painless test that uses sound waves to create images of your heart. It provides your doctor with information about the size and shape of your heart and how well your heart's chambers and valves are working. This procedure takes approximately one hour. There are no restrictions for this procedure.    Follow-Up: At [facility], you and your health needs are our priority.  As part of our continuing mission to provide you with exceptional heart care, we have created designated Provider Care Teams.  These Care Teams include your primary Cardiologist (physician) and Advanced Practice Providers (APPs -  Physician Assistants and Nurse Practitioners) who all work together to provide you with the care you need, when you need it.  Your next appointment:   3 week(s)  The format for your next appointment:   In Person  Provider:   You may see Sinclair Grooms, MD or one of the following Advanced Practice Providers on your designated Care Team:    Daune Perch, NP    Other Instructions Edema  Edema is when you have too much fluid in your body or under your skin. Edema may make your legs, feet, and ankles swell up. Swelling is also common in looser tissues, like around your eyes. This is a common condition. It gets more common as you get  older. There are many possible causes of edema. Eating too much salt (sodium) and being on your feet or sitting for a long time can cause edema in your legs, feet, and ankles. Hot weather may make edema worse. Edema is usually painless. Your skin may look swollen or shiny. Follow these instructions at home:  Keep the swollen body part raised (elevated) above the level of your heart when you are sitting or lying down.  Do not sit still or stand for a long time.  Do not wear tight clothes. Do not wear garters on your upper legs.  Exercise your legs. This can help the swelling go down.  Wear elastic bandages or support stockings as told by your doctor.  Eat a low-salt (low-sodium) diet to reduce fluid as told by your doctor.  Depending on the cause of your swelling, you may need to limit how much fluid you drink (fluid restriction).  Take over-the-counter and prescription medicines only as told by your doctor. Contact a doctor if:  Treatment is not working.  You have heart, liver, or kidney disease and have symptoms of edema.  You have sudden and unexplained weight gain. Get help right away if:  You have shortness of breath or chest pain.  You cannot breathe when you lie down.  You have pain, redness, or warmth in the swollen areas.  You have heart, liver, or kidney disease and get edema all of a  sudden.  You have a fever and your symptoms get worse all of a sudden. Summary  Edema is when you have too much fluid in your body or under your skin.  Edema may make your legs, feet, and ankles swell up. Swelling is also common in looser tissues, like around your eyes.  Raise (elevate) the swollen body part above the level of your heart when you are sitting or lying down.  Follow your doctor's instructions about diet and how much fluid you can drink (fluid restriction). This information is not intended to replace advice given to you by your health care provider. Make sure you  discuss any questions you have with your health care provider. Document Revised: 04/21/2017 Document Reviewed: 05/06/2016 Elsevier Patient Education  2020 [employer].

## 2019-05-09 NOTE — Progress Notes (Signed)
Cardiology Office Note:    Date:  05/09/2019   ID:  [name], DOB Sep 11, 1932, MRN [id number]  PCP:  Lajean Manes, MD  Cardiologist:  Sinclair Grooms, MD  Referring MD: Lajean Manes, MD   Chief Complaint  Patient presents with  . Edema    History of Present Illness:    Donald Rasmussen is a 86 y.o. male with a past medical history significant for persistent atrial fibrillation, coronary artery disease with prior RCA stent (BMS 1999), hypertension, chronic anticoagulation therapy and CKD stage III.  Patient had low risk Myoview in 10/2015.  The patient was last seen by telemedicine on 09/03/2018 at which time he was doing fairly well.  Since that visit the patient has had lower extremity swelling with wounds that both improved on a round of Keflex per PCP.  [name] is being seen today for evaluation of lower extremity edema. He lives alone, his wife passed away, but family visits every day. His grandson is with him and the pt is very hard of hearing. His grandson says that the patient's memory has declined since losing his wife and he is not able to provide much information about his symptoms. Pt denies chest discomfort. He also denies orthopnea, but family often find him having slept in the recliner which is new for him. Pt denies DOE. He says that walking in her was fine and could have walked farther, but it is unclear whether he is aware of whether his breathing is harder.  He has been getting dizziness and says he has felt like he had to stand and wait so he doesn't fall.   He has been elevating his legs which was helping, but now not so. He has some healing lower leg wounds, now scabbed. His grandson says that the cellulitis is much better but the swelling has persisted.   The patient admits that he likes salt and adds salt to most of his food. His family often bring him fast food for lunch like [employer] subs and hot dogs. When his daughter cooks for him she tries to provide  healthier options.    Cardiac studies     Past Medical History:  Diagnosis Date  . Cancer [facility])    prostate; Dr. Amalia Hailey  . Chronic kidney disease    Stg III kidney disease; GRF 55  . Chronic low back pain    2000 lumbosacral spine degenerative change. Possible calculus  . Complication of anesthesia   . Coronary artery disease    BMS 1999  . Edentulous   . Gout   . Hyperlipidemia   . Hypertension   . Impaired fasting blood sugar   . Mucous cyst of tonsil     Past Surgical History:  Procedure Laterality Date  . CARDIAC CATHETERIZATION     stent   . CARDIOVERSION  03/07/2012   Procedure: CARDIOVERSION;  Surgeon: Sinclair Grooms, MD;  Location: [place];  Service: Cardiovascular;  Laterality: N/A;  . HEMORRHOID SURGERY    . RETROPUBIC PROSTATECTOMY  1997  . sebaceous cyst back      Current Medications: Current Meds  Medication Sig  . acetaminophen (TYLENOL) 500 MG tablet Take 500 mg by mouth every 6 (six) hours as needed (pain).   Marland Kitchen allopurinol (ZYLOPRIM) 300 MG tablet Take 300 mg by mouth daily.  Marland Kitchen amLODipine (NORVASC) 5 MG tablet Take 1 tablet (5 mg total) by mouth daily.  Marland Kitchen apixaban (ELIQUIS) 5 MG TABS tablet Take  1 tablet (5 mg total) by mouth 2 (two) times daily.  Marland Kitchen atorvastatin (LIPITOR) 20 MG tablet Take 1 tablet (20 mg total) by mouth daily.  . cholecalciferol (VITAMIN D) 1000 UNITS tablet Take 2,000 Units by mouth daily.  Marland Kitchen EPINEPHrine (EPIPEN JR) 0.15 MG/0.3ML injection Inject 0.15 mg into the muscle as needed for anaphylaxis.   . fish oil-omega-3 fatty acids 1000 MG capsule Take 1,200 mg by mouth daily.  . metoprolol tartrate (LOPRESSOR) 25 MG tablet Take 0.5 tablets (12.5 mg total) by mouth 2 (two) times daily.  . nitroGLYCERIN (NITROSTAT) 0.4 MG SL tablet Place 1 tablet (0.4 mg total) under the tongue every 5 (five) minutes as needed for chest pain.  Marland Kitchen quinapril (ACCUPRIL) 20 MG tablet Take 1 tablet (20 mg total) by mouth daily.  . [DISCONTINUED]  metoprolol tartrate (LOPRESSOR) 25 MG tablet Take 1 tablet (25 mg total) by mouth 2 (two) times daily.     Allergies:   Bee venom   Social History   Socioeconomic History  . Marital status: Married    Spouse name: Not on file  . Number of children: Not on file  . Years of education: Not on file  . Highest education level: Not on file  Occupational History  . Not on file  Tobacco Use  . Smoking status: Former Research scientist (life sciences)  . Smokeless tobacco: Current User    Types: Chew  Substance and Sexual Activity  . Alcohol use: Yes    Alcohol/week: 0.0 standard drinks  . Drug use: No  . Sexual activity: Not on file  Other Topics Concern  . Not on file  Social History Narrative  . Not on file   Social Determinants of Health   Financial Resource Strain:   . Difficulty of Paying Living Expenses: Not on file  Food Insecurity:   . Worried About Charity fundraiser in the Last Year: Not on file  . Ran Out of Food in the Last Year: Not on file  Transportation Needs:   . Lack of Transportation (Medical): Not on file  . Lack of Transportation (Non-Medical): Not on file  Physical Activity:   . Days of Exercise per Week: Not on file  . Minutes of Exercise per Session: Not on file  Stress:   . Feeling of Stress : Not on file  Social Connections:   . Frequency of Communication with Friends and Family: Not on file  . Frequency of Social Gatherings with Friends and Family: Not on file  . Attends Religious Services: Not on file  . Active Member of Clubs or Organizations: Not on file  . Attends Archivist Meetings: Not on file  . Marital Status: Not on file     Family History: The patient's family history includes Heart attack in his father and mother; Hyperlipidemia in his mother. ROS:   Please see the history of present illness.     All other systems reviewed and are negative.   EKG:  EKG is ordered today.  The ekg ordered today demonstrates atrial fibrillation with slow  ventricular response, 53 bpm, QTC 424. Lateral non-specific T changes- similar in the past  Recent Labs: No results found for requested labs within last 8760 hours.   Recent Lipid Panel No results found for: CHOL, TRIG, HDL, CHOLHDL, VLDL, LDLCALC, LDLDIRECT  Physical Exam:    VS:  BP 112/78   Pulse (!) 53   Ht 6\' 4"  (1.93 m)   Wt 200 lb 1.9 oz (90.8  kg)   SpO2 98%   BMI 24.36 kg/m     Wt Readings from Last 6 Encounters:  05/09/19 200 lb 1.9 oz (90.8 kg)  09/03/18 175 lb (79.4 kg)  10/11/17 183 lb 12.8 oz (83.4 kg)  06/21/17 180 lb 9.6 oz (81.9 kg)  10/04/16 180 lb (81.6 kg)  01/29/16 184 lb 1.9 oz (83.5 kg)     Physical Exam  Constitutional: He appears well-developed and well-nourished. No distress.  Elderly male  HENT:  Head: Normocephalic and atraumatic.  Neck: No JVD present.  Cardiovascular: Normal rate, regular rhythm, normal heart sounds and intact distal pulses. Exam reveals no gallop and no friction rub.  No murmur heard. Pulmonary/Chest: Effort normal and breath sounds normal. No respiratory distress.  Few crackles in left base  Abdominal: Soft. Bowel sounds are normal.  Musculoskeletal:        General: Edema present. Normal range of motion.     Cervical back: Normal range of motion and neck supple.     Comments: 2-3+ lower leg edema. Few old scabs noted. Slight overall redness (better after treatment with Keflex per grandson)  Neurological: He is alert.  Poor memory and recall  Skin: Skin is warm and dry.  Psychiatric: He has a normal mood and affect. His behavior is normal.  Vitals reviewed.    ASSESSMENT:    1. Leg swelling   2. SOB (shortness of breath)   3. Chronic atrial fibrillation ([place])   4. Essential (primary) hypertension   5. Coronary artery disease involving native coronary artery of native heart without angina pectoris    PLAN:    In order of problems listed above:  Leg swelling/Possible DOE -Pt with leg swelling for several  months. Initially improved with elevation, but now constant.  -Pt admits to eating a lot of salt. I spoke with him and his grandson about reducing salt intake.  -Pt denies orthopnea or PND but he is not a reliable historian. Family have noted him to often sleep in a recliner which is new over the last few months.  -Pt has few crackles in the left lung base.  -I will check BNP and BMet. Will start low dose lasix. We discussed safety precautions to prevent falls. -No echo in Epic so I will check an echo for LV function and valve status (no murmur on exam).   Persistent atrial fibrillation -Rate controlled with beta-blocker. HR is in the 50's. Pt has had some dizziness and worsening of mental status per grandson. I will reduce his beta blocker to allow for a little higher heart rate. -CHA2DS2-VASc score 4 (age (4), HTN, CAD).  Patient is on Eliquis 5 mg twice daily for stroke risk reduction. No unusual bleeding  Hypertension -Managed on amlodipine 5 mg, metoprolol 25 mg -BP well controlled. Watch with reduction in metoprolol and addition of lasix.   CAD -S/p BMS to RCA in [place] on metoprolol 25 mg, statin, no aspirin in the setting of anticoagulation with Eliquis -Last ischemic evaluation was a low risk Myoview in 10/2015. -No chest discomfort.    Medication Adjustments/Labs and Tests Ordered: Current medicines are reviewed at length with the patient today.  Concerns regarding medicines are outlined above. Labs and tests ordered and medication changes are outlined in the patient instructions below:  Patient Instructions  Medication Instructions:  1) Decrease Metoprolol to 12.5 mg twice a day   2) Start Lasix 20 mg once a day   *If you need a refill on  your cardiac medications before your next appointment, please call your pharmacy*  Lab Work: Bmet & Pro BNP- Today   If you have labs (blood work) drawn today and your tests are completely normal, you will receive your results  only by: Marland Kitchen MyChart Message (if you have MyChart) OR . A paper copy in the mail If you have any lab test that is abnormal or we need to change your treatment, we will call you to review the results.  Testing/Procedures: Your physician has requested that you have an echocardiogram. Echocardiography is a painless test that uses sound waves to create images of your heart. It provides your doctor with information about the size and shape of your heart and how well your heart's chambers and valves are working. This procedure takes approximately one hour. There are no restrictions for this procedure.    Follow-Up: At [facility], you and your health needs are our priority.  As part of our continuing mission to provide you with exceptional heart care, we have created designated Provider Care Teams.  These Care Teams include your primary Cardiologist (physician) and Advanced Practice Providers (APPs -  Physician Assistants and Nurse Practitioners) who all work together to provide you with the care you need, when you need it.  Your next appointment:   3 week(s)  The format for your next appointment:   In Person  Provider:   You may see Sinclair Grooms, MD or one of the following Advanced Practice Providers on your designated Care Team:    Daune Perch, NP    Other Instructions Edema  Edema is when you have too much fluid in your body or under your skin. Edema may make your legs, feet, and ankles swell up. Swelling is also common in looser tissues, like around your eyes. This is a common condition. It gets more common as you get older. There are many possible causes of edema. Eating too much salt (sodium) and being on your feet or sitting for a long time can cause edema in your legs, feet, and ankles. Hot weather may make edema worse. Edema is usually painless. Your skin may look swollen or shiny. Follow these instructions at home:  Keep the swollen body part raised (elevated) above the  level of your heart when you are sitting or lying down.  Do not sit still or stand for a long time.  Do not wear tight clothes. Do not wear garters on your upper legs.  Exercise your legs. This can help the swelling go down.  Wear elastic bandages or support stockings as told by your doctor.  Eat a low-salt (low-sodium) diet to reduce fluid as told by your doctor.  Depending on the cause of your swelling, you may need to limit how much fluid you drink (fluid restriction).  Take over-the-counter and prescription medicines only as told by your doctor. Contact a doctor if:  Treatment is not working.  You have heart, liver, or kidney disease and have symptoms of edema.  You have sudden and unexplained weight gain. Get help right away if:  You have shortness of breath or chest pain.  You cannot breathe when you lie down.  You have pain, redness, or warmth in the swollen areas.  You have heart, liver, or kidney disease and get edema all of a sudden.  You have a fever and your symptoms get worse all of a sudden. Summary  Edema is when you have too much fluid in your body or  under your skin.  Edema may make your legs, feet, and ankles swell up. Swelling is also common in looser tissues, like around your eyes.  Raise (elevate) the swollen body part above the level of your heart when you are sitting or lying down.  Follow your doctor's instructions about diet and how much fluid you can drink (fluid restriction). This information is not intended to replace advice given to you by your health care provider. Make sure you discuss any questions you have with your health care provider. Document Revised: 04/21/2017 Document Reviewed: 05/06/2016 Elsevier Patient Education  2020 [place], Daune Perch, NP  05/09/2019 11:49 AM    [place]

## 2019-05-10 ENCOUNTER — Ambulatory Visit: Payer: Medicare Other | Admitting: Interventional Cardiology

## 2019-05-10 LAB — BASIC METABOLIC PANEL
BUN/Creatinine Ratio: 11 (ref 10–24)
BUN: 12 mg/dL (ref 8–27)
CO2: 26 mmol/L (ref 20–29)
Calcium: 9.7 mg/dL (ref 8.6–10.2)
Chloride: 101 mmol/L (ref 96–106)
Creatinine, Ser: 1.06 mg/dL (ref 0.76–1.27)
GFR calc Af Amer: 74 mL/min/{1.73_m2} (ref >59)
GFR calc non Af Amer: 64 mL/min/{1.73_m2} (ref >59)
Glucose: 103 mg/dL — ABNORMAL HIGH (ref 65–99)
Potassium: 4.1 mmol/L (ref 3.5–5.2)
Sodium: 141 mmol/L (ref 134–144)

## 2019-05-10 LAB — CBC
Hematocrit: 40.3 % (ref 37.5–51.0)
Hemoglobin: 13.9 g/dL (ref 13.0–17.7)
MCH: 32 pg (ref 26.6–33.0)
MCHC: 34.5 g/dL (ref 31.5–35.7)
MCV: 93 fL (ref 79–97)
Platelets: 242 10*3/uL (ref 150–450)
RBC: 4.35 x10E6/uL (ref 4.14–5.80)
RDW: 11.9 % (ref 11.6–15.4)
WBC: 6.9 10*3/uL (ref 3.4–10.8)

## 2019-05-10 LAB — PRO B NATRIURETIC PEPTIDE: NT-Pro BNP: 1174 pg/mL — ABNORMAL HIGH (ref 0–486)

## 2019-05-14 ENCOUNTER — Telehealth: Payer: Self-pay

## 2019-05-14 NOTE — Telephone Encounter (Signed)
Spoke with patients grandson per DPR. Pt made aware of results.Pt is agreeable with plan

## 2019-05-14 NOTE — Telephone Encounter (Signed)
-----   Message from Daune Perch, NP sent at 05/10/2019  9:22 AM EST ----- Kidney function and potassium are normal. BNP is elevated indicating too much fluid in the body. We started lasix 20 mg daily at office visit yesterday.  Please have patient take 40 mg daily for 7 days, then go to 20 mg daily.  Follow-up as scheduled with [name]. Daune Perch, NP

## 2019-05-21 ENCOUNTER — Other Ambulatory Visit (HOSPITAL_COMMUNITY): Payer: Medicare HMO

## 2019-05-23 DIAGNOSIS — F0280 Dementia in other diseases classified elsewhere without behavioral disturbance: Secondary | ICD-10-CM | POA: Diagnosis not present

## 2019-05-23 DIAGNOSIS — E7800 Pure hypercholesterolemia, unspecified: Secondary | ICD-10-CM | POA: Diagnosis not present

## 2019-05-23 DIAGNOSIS — G301 Alzheimer's disease with late onset: Secondary | ICD-10-CM | POA: Diagnosis not present

## 2019-05-23 DIAGNOSIS — I4819 Other persistent atrial fibrillation: Secondary | ICD-10-CM | POA: Diagnosis not present

## 2019-05-23 DIAGNOSIS — I10 Essential (primary) hypertension: Secondary | ICD-10-CM | POA: Diagnosis not present

## 2019-05-27 ENCOUNTER — Ambulatory Visit: Payer: Medicare Other | Admitting: Interventional Cardiology

## 2019-05-29 DIAGNOSIS — R600 Localized edema: Secondary | ICD-10-CM | POA: Diagnosis not present

## 2019-05-29 DIAGNOSIS — I4819 Other persistent atrial fibrillation: Secondary | ICD-10-CM | POA: Diagnosis not present

## 2019-05-29 DIAGNOSIS — I10 Essential (primary) hypertension: Secondary | ICD-10-CM | POA: Diagnosis not present

## 2019-05-29 DIAGNOSIS — F0280 Dementia in other diseases classified elsewhere without behavioral disturbance: Secondary | ICD-10-CM | POA: Diagnosis not present

## 2019-05-29 DIAGNOSIS — G8929 Other chronic pain: Secondary | ICD-10-CM | POA: Diagnosis not present

## 2019-05-29 DIAGNOSIS — G301 Alzheimer's disease with late onset: Secondary | ICD-10-CM | POA: Diagnosis not present

## 2019-05-30 NOTE — Progress Notes (Addendum)
Cardiology Office Note:    Date:  05/31/2019   ID:  AVEER MERLAN, DOB [birth year], MRN [id number]  PCP:  Lajean Manes, MD  Cardiologist:  Sinclair Grooms, MD   Referring MD: Lajean Manes, MD   Chief Complaint  Patient presents with  . Congestive Heart Failure  . Atrial Fibrillation    History of Present Illness:    Donald Rasmussen is a 86 y.o. male with a hx of persistent atrial fibrillation, coronary artery disease with prior RCA stent (BMS-1999), hypertension, chronic anticoagulation therapy and CKD stage III.  He is blissfully confused.  His daughter has brought him in today.  He was seen 2 weeks ago because of progressive lower extremity swelling.  He denies syncope.  He sits most of the time.  Legs have not decreased in girth since adding low-dose furosemide.  He does not have skin breakdown currently.  Past Medical History:  Diagnosis Date  . Cancer [facility])    prostate; Dr. Amalia Hailey  . Chronic kidney disease    Stg III kidney disease; GRF 55  . Chronic low back pain    2000 lumbosacral spine degenerative change. Possible calculus  . Complication of anesthesia   . Coronary artery disease    BMS 1999  . Edentulous   . Gout   . Hyperlipidemia   . Hypertension   . Impaired fasting blood sugar   . Mucous cyst of tonsil     Past Surgical History:  Procedure Laterality Date  . CARDIAC CATHETERIZATION     stent   . CARDIOVERSION  03/07/2012   Procedure: CARDIOVERSION;  Surgeon: Sinclair Grooms, MD;  Location: [place];  Service: Cardiovascular;  Laterality: N/A;  . HEMORRHOID SURGERY    . RETROPUBIC PROSTATECTOMY  1997  . sebaceous cyst back      Current Medications: Current Meds  Medication Sig  . acetaminophen (TYLENOL) 500 MG tablet Take 500 mg by mouth every 6 (six) hours as needed (pain).   Marland Kitchen allopurinol (ZYLOPRIM) 300 MG tablet Take 300 mg by mouth daily.  Marland Kitchen amLODipine (NORVASC) 5 MG tablet Take 1 tablet (5 mg total) by mouth daily.  Marland Kitchen apixaban  (ELIQUIS) 5 MG TABS tablet Take 1 tablet (5 mg total) by mouth 2 (two) times daily.  Marland Kitchen atorvastatin (LIPITOR) 20 MG tablet Take 1 tablet (20 mg total) by mouth daily.  . cholecalciferol (VITAMIN D) 1000 UNITS tablet Take 2,000 Units by mouth daily.  Marland Kitchen EPINEPHrine (EPIPEN JR) 0.15 MG/0.3ML injection Inject 0.15 mg into the muscle as needed for anaphylaxis.   . fish oil-omega-3 fatty acids 1000 MG capsule Take 1,200 mg by mouth daily.  . furosemide (LASIX) 20 MG tablet Take 1 tablet (20 mg total) by mouth daily.  . metoprolol tartrate (LOPRESSOR) 25 MG tablet Take 0.5 tablets (12.5 mg total) by mouth 2 (two) times daily.  . nitroGLYCERIN (NITROSTAT) 0.4 MG SL tablet Place 1 tablet (0.4 mg total) under the tongue every 5 (five) minutes as needed for chest pain.  Marland Kitchen quinapril (ACCUPRIL) 20 MG tablet Take 1 tablet (20 mg total) by mouth daily.     Allergies:   Bee venom   Social History   Socioeconomic History  . Marital status: Married    Spouse name: Not on file  . Number of children: Not on file  . Years of education: Not on file  . Highest education level: Not on file  Occupational History  . Not on file  Tobacco Use  .  Smoking status: Former Research scientist (life sciences)  . Smokeless tobacco: Current User    Types: Chew  Substance and Sexual Activity  . Alcohol use: Yes    Alcohol/week: 0.0 standard drinks  . Drug use: No  . Sexual activity: Not on file  Other Topics Concern  . Not on file  Social History Narrative  . Not on file   Social Determinants of Health   Financial Resource Strain:   . Difficulty of Paying Living Expenses: Not on file  Food Insecurity:   . Worried About Charity fundraiser in the Last Year: Not on file  . Ran Out of Food in the Last Year: Not on file  Transportation Needs:   . Lack of Transportation (Medical): Not on file  . Lack of Transportation (Non-Medical): Not on file  Physical Activity:   . Days of Exercise per Week: Not on file  . Minutes of Exercise per  Session: Not on file  Stress:   . Feeling of Stress : Not on file  Social Connections:   . Frequency of Communication with Friends and Family: Not on file  . Frequency of Social Gatherings with Friends and Family: Not on file  . Attends Religious Services: Not on file  . Active Member of Clubs or Organizations: Not on file  . Attends Archivist Meetings: Not on file  . Marital Status: Not on file     Family History: The patient's family history includes Heart attack in his father and mother; Hyperlipidemia in his mother.  ROS:   Please see the history of present illness.    They did not feel he would be able to put on support stockings.  All other systems reviewed and are negative.  EKGs/Labs/Other Studies Reviewed:    The following studies were reviewed today: Echocardiogram is pending today  EKG:  EKG performed May 09, 2019 revealed atrial fib with slow ventricular response.  Recent Labs: 05/09/2019: BUN 12; Creatinine, Ser 1.06; Hemoglobin 13.9; NT-Pro BNP 1,174; Platelets 242; Potassium 4.1; Sodium 141  Recent Lipid Panel No results found for: CHOL, TRIG, HDL, CHOLHDL, VLDL, LDLCALC, LDLDIRECT  Physical Exam:    VS:  BP (!) 158/88   Pulse 81   Ht 6\' 4"  (1.93 m)   Wt 199 lb (90.3 kg)   SpO2 99%   BMI 24.22 kg/m     Wt Readings from Last 3 Encounters:  05/31/19 199 lb (90.3 kg)  05/09/19 200 lb 1.9 oz (90.8 kg)  09/03/18 175 lb (79.4 kg)     GEN: Elderly. No acute distress HEENT: Normal NECK: No JVD. LYMPHATICS: No lymphadenopathy CARDIAC: Irregularly irregular bradycardic rhythm without murmur or gallop 2-3+ bilateral below the knee edema. VASCULAR:  Normal Pulses. No bruits. RESPIRATORY:  Clear to auscultation without rales, wheezing or rhonchi  ABDOMEN: Soft, non-tender, non-distended, No pulsatile mass, MUSCULOSKELETAL: No deformity  SKIN: Warm and dry NEUROLOGIC: Oriented to person and place.  Tangential conversation. PSYCHIATRIC: Tangential  thought process that is inappropriate for questions asked.  ASSESSMENT:    1. Chronic atrial fibrillation ([place])   2. Coronary artery disease involving native coronary artery of native heart without angina pectoris   3. Bilateral lower extremity edema   4. Long term current use of anticoagulant therapy   5. Essential hypertension   6. Educated about COVID-19 virus infection    PLAN:    In order of problems listed above:  1. Rate control is nearing excessive.  Continue low-dose metoprolol. 2. No symptoms of  angina 3. Edema is related to diastolic heart failure, lower extremity dependency, and venous insufficiency.  Knee-high support stockings are recommended.  We will add low-dose Aldactone 12.5 mg/day.  Basic metabolic panel in 1 week.  Continue furosemide and ACE inhibitor therapy.  He is having an echocardiogram performed today and if Definity or bubble study is needed, I authorize performing to enhance quality of study. 4. Continue Coumadin 5. Elevated blood pressure.  Target 140/80 mmHg.  Low-dose Aldactone will help better control blood pressure.  4 to 6-week follow-up.  Basic metabolic panel in 1 week.  Discussed considering assisted living as the patient is living independently but frail and potential exists for a very poor outcome from falls or other issues.   Medication Adjustments/Labs and Tests Ordered: Current medicines are reviewed at length with the patient today.  Concerns regarding medicines are outlined above.  Orders Placed This Encounter  Procedures  . Basic metabolic panel   Meds ordered this encounter  Medications  . spironolactone (ALDACTONE) 25 MG tablet    Sig: Take 0.5 tablets (12.5 mg total) by mouth daily.    Dispense:  45 tablet    Refill:  3    Patient Instructions  Medication Instructions:  1) START Spironolactone 12.5mg  once daily  *If you need a refill on your cardiac medications before your next appointment, please call your pharmacy*  Lab  Work: BMET in 1 week  If you have labs (blood work) drawn today and your tests are completely normal, you will receive your results only by: Marland Kitchen MyChart Message (if you have MyChart) OR . A paper copy in the mail If you have any lab test that is abnormal or we need to change your treatment, we will call you to review the results.  Testing/Procedures: None  Follow-Up: At [facility], you and your health needs are our priority.  As part of our continuing mission to provide you with exceptional heart care, we have created designated Provider Care Teams.  These Care Teams include your primary Cardiologist (physician) and Advanced Practice Providers (APPs -  Physician Assistants and Nurse Practitioners) who all work together to provide you with the care you need, when you need it.  Your next appointment:   4-6 week(s)  The format for your next appointment:   In Person  Provider:   You may see Sinclair Grooms, MD or one of the following Advanced Practice Providers on your designated Care Team:    Truitt Merle, NP  Cecilie Kicks, NP  Kathyrn Drown, NP   Other Instructions  Your physician recommends that you wear knee high compression stockings during the day.  Take them off at night prior to going to bed.     Signed, Sinclair Grooms, MD  05/31/2019 11:07 AM    [place]

## 2019-05-31 ENCOUNTER — Other Ambulatory Visit: Payer: Self-pay

## 2019-05-31 ENCOUNTER — Ambulatory Visit: Payer: Medicare HMO | Admitting: Interventional Cardiology

## 2019-05-31 ENCOUNTER — Other Ambulatory Visit: Payer: Self-pay | Admitting: Cardiology

## 2019-05-31 ENCOUNTER — Encounter: Payer: Self-pay | Admitting: Interventional Cardiology

## 2019-05-31 ENCOUNTER — Ambulatory Visit (HOSPITAL_COMMUNITY): Payer: Medicare HMO | Attending: Cardiology

## 2019-05-31 VITALS — BP 158/88 | HR 81 | Ht 76.0 in | Wt 199.0 lb

## 2019-05-31 DIAGNOSIS — I313 Pericardial effusion (noninflammatory): Secondary | ICD-10-CM | POA: Insufficient documentation

## 2019-05-31 DIAGNOSIS — I129 Hypertensive chronic kidney disease with stage 1 through stage 4 chronic kidney disease, or unspecified chronic kidney disease: Secondary | ICD-10-CM | POA: Insufficient documentation

## 2019-05-31 DIAGNOSIS — Z87891 Personal history of nicotine dependence: Secondary | ICD-10-CM | POA: Diagnosis not present

## 2019-05-31 DIAGNOSIS — R600 Localized edema: Secondary | ICD-10-CM | POA: Diagnosis not present

## 2019-05-31 DIAGNOSIS — E785 Hyperlipidemia, unspecified: Secondary | ICD-10-CM | POA: Diagnosis not present

## 2019-05-31 DIAGNOSIS — M7989 Other specified soft tissue disorders: Secondary | ICD-10-CM

## 2019-05-31 DIAGNOSIS — I4891 Unspecified atrial fibrillation: Secondary | ICD-10-CM | POA: Insufficient documentation

## 2019-05-31 DIAGNOSIS — I482 Chronic atrial fibrillation, unspecified: Secondary | ICD-10-CM

## 2019-05-31 DIAGNOSIS — I2510 Atherosclerotic heart disease of native coronary artery without angina pectoris: Secondary | ICD-10-CM

## 2019-05-31 DIAGNOSIS — N189 Chronic kidney disease, unspecified: Secondary | ICD-10-CM | POA: Diagnosis not present

## 2019-05-31 DIAGNOSIS — I10 Essential (primary) hypertension: Secondary | ICD-10-CM | POA: Diagnosis not present

## 2019-05-31 DIAGNOSIS — Z7901 Long term (current) use of anticoagulants: Secondary | ICD-10-CM

## 2019-05-31 DIAGNOSIS — Z7189 Other specified counseling: Secondary | ICD-10-CM | POA: Diagnosis not present

## 2019-05-31 DIAGNOSIS — R2243 Localized swelling, mass and lump, lower limb, bilateral: Secondary | ICD-10-CM

## 2019-05-31 LAB — ECHOCARDIOGRAM COMPLETE BUBBLE STUDY
Height: 76 in
Weight: 3184 oz

## 2019-05-31 MED ORDER — SPIRONOLACTONE 25 MG PO TABS: 12.5000 mg | ORAL_TABLET | Freq: Every day | ORAL | 3 refills | Status: DC

## 2019-05-31 NOTE — Patient Instructions (Signed)
Medication Instructions:  1) START Spironolactone 12.5mg  once daily  *If you need a refill on your cardiac medications before your next appointment, please call your pharmacy*  Lab Work: BMET in 1 week  If you have labs (blood work) drawn today and your tests are completely normal, you will receive your results only by: Marland Kitchen MyChart Message (if you have MyChart) OR . A paper copy in the mail If you have any lab test that is abnormal or we need to change your treatment, we will call you to review the results.  Testing/Procedures: None  Follow-Up: At [facility], you and your health needs are our priority.  As part of our continuing mission to provide you with exceptional heart care, we have created designated Provider Care Teams.  These Care Teams include your primary Cardiologist (physician) and Advanced Practice Providers (APPs -  Physician Assistants and Nurse Practitioners) who all work together to provide you with the care you need, when you need it.  Your next appointment:   4-6 week(s)  The format for your next appointment:   In Person  Provider:   You may see Sinclair Grooms, MD or one of the following Advanced Practice Providers on your designated Care Team:    Truitt Merle, NP  Cecilie Kicks, NP  Kathyrn Drown, NP   Other Instructions  Your physician recommends that you wear knee high compression stockings during the day.  Take them off at night prior to going to bed.

## 2019-06-07 ENCOUNTER — Telehealth: Payer: Self-pay | Admitting: Cardiology

## 2019-06-07 ENCOUNTER — Other Ambulatory Visit: Payer: Self-pay

## 2019-06-07 ENCOUNTER — Other Ambulatory Visit: Payer: Medicare HMO | Admitting: *Deleted

## 2019-06-07 DIAGNOSIS — I482 Chronic atrial fibrillation, unspecified: Secondary | ICD-10-CM | POA: Diagnosis not present

## 2019-06-07 DIAGNOSIS — I2510 Atherosclerotic heart disease of native coronary artery without angina pectoris: Secondary | ICD-10-CM

## 2019-06-07 NOTE — Telephone Encounter (Signed)
I called and spoke to Stacie Acres, the patient's grandson, regarding the results of the echocardiogram.  Pumping function was normal however there was some indication of possible amyloidosis.  As per discussion with [name], I discussed that this is a tough situation. That Perhaps amyloid is contributing to symptoms, although they are mild. The next step would be to do a nuclear medicine amyloid scan (technetium). He does not have symptomatic heart failure. Not sure if he would be able to afford therapy for amyloid and there may not be a good/practical management strategy even if we identify the presence of amyloidosis.The therapy happens to be very expensive, does not reverse the disease, but may prevent rapid worsening.  Marcello Moores notes that he will discuss the findings with the patient and the family. He thinks that they will lean toward being more conservative. The patient has a follow up appt on 3/1 with Truitt Merle, NP and they will discuss options more at that appt.

## 2019-06-07 NOTE — Telephone Encounter (Signed)
Great, thanks

## 2019-06-08 LAB — BASIC METABOLIC PANEL
BUN/Creatinine Ratio: 13 (ref 10–24)
BUN: 14 mg/dL (ref 8–27)
CO2: 28 mmol/L (ref 20–29)
Calcium: 9.6 mg/dL (ref 8.6–10.2)
Chloride: 107 mmol/L — ABNORMAL HIGH (ref 96–106)
Creatinine, Ser: 1.09 mg/dL (ref 0.76–1.27)
GFR calc Af Amer: 72 mL/min/{1.73_m2} (ref >59)
GFR calc non Af Amer: 62 mL/min/{1.73_m2} (ref >59)
Glucose: 105 mg/dL — ABNORMAL HIGH (ref 65–99)
Potassium: 3.5 mmol/L (ref 3.5–5.2)
Sodium: 149 mmol/L — ABNORMAL HIGH (ref 134–144)

## 2019-06-10 ENCOUNTER — Other Ambulatory Visit: Payer: Self-pay | Admitting: *Deleted

## 2019-06-10 MED ORDER — SPIRONOLACTONE 25 MG PO TABS: 12.5000 mg | ORAL_TABLET | ORAL | 3 refills | Status: DC

## 2019-06-11 ENCOUNTER — Telehealth: Payer: Self-pay | Admitting: Interventional Cardiology

## 2019-06-11 ENCOUNTER — Other Ambulatory Visit: Payer: Self-pay | Admitting: Interventional Cardiology

## 2019-06-11 ENCOUNTER — Telehealth: Payer: Self-pay | Admitting: Nurse Practitioner

## 2019-06-11 DIAGNOSIS — I4819 Other persistent atrial fibrillation: Secondary | ICD-10-CM | POA: Diagnosis not present

## 2019-06-11 DIAGNOSIS — I10 Essential (primary) hypertension: Secondary | ICD-10-CM | POA: Diagnosis not present

## 2019-06-11 DIAGNOSIS — G301 Alzheimer's disease with late onset: Secondary | ICD-10-CM | POA: Diagnosis not present

## 2019-06-11 DIAGNOSIS — F0280 Dementia in other diseases classified elsewhere without behavioral disturbance: Secondary | ICD-10-CM | POA: Diagnosis not present

## 2019-06-11 DIAGNOSIS — E7800 Pure hypercholesterolemia, unspecified: Secondary | ICD-10-CM | POA: Diagnosis not present

## 2019-06-11 MED ORDER — APIXABAN 5 MG PO TABS: 5.0000 mg | ORAL_TABLET | Freq: Two times a day (BID) | ORAL | 1 refills | Status: DC

## 2019-06-11 MED ORDER — SPIRONOLACTONE 25 MG PO TABS: 12.5000 mg | ORAL_TABLET | ORAL | 3 refills | Status: DC

## 2019-06-11 NOTE — Telephone Encounter (Signed)
*  STAT* If patient is at the pharmacy, call can be transferred to refill team.   1. Which medications need to be refilled? (please list name of each medication and dose if known)  apixaban (ELIQUIS) 5 MG TABS tablet  2. Which pharmacy/location (including street and city if local pharmacy) is medication to be sent to? [facility] - [place], [place] - [place] [address]  3. Do they need a 30 day or 90 day supply? 90 day supply   Patient is currently out of medication.

## 2019-06-11 NOTE — Telephone Encounter (Signed)
Called number listed at bottom of call and she states that she did not call.  Called Grandson who normally takes care of pt and he states he did not call either.  Yolanda Bonine said it was likely Engineer, agricultural.  Sent prescription for Spironolactone to [facility].  Spoke with someone there and they did receive the Escript and said they would get it taken care of but they had not called the office.  Unsure of who actually called.  Pharmacy is taking care of medication.  Daughter in law and grandson are aware of medication change.  Will close note.

## 2019-06-11 NOTE — Telephone Encounter (Signed)
Prescription refill request for Eliquis received.  Last office visit: Donald Rasmussen 05/31/2019 Scr: 1.09, 06/07/2019 Age: 86 y.o. Weight: 90.3kg  Prescription refill sent.

## 2019-06-11 NOTE — Telephone Encounter (Signed)
Pt c/o medication issue:  1. Name of Medication: spironolactone (ALDACTONE) 25 MG tablet  2. How are you currently taking this medication (dosage and times per day)? 1/2 a tablet by mouth every Monday, Wednesday, and Friday   3. Are you having a reaction (difficulty breathing--STAT)? No   4. What is your medication issue? Kennitra from [facility] is calling in regards to the patient's listed medication. She states the last prescription she has on file that the patient had filled stated to take a 1/2 a tablet daily and the grandson was under the impression he is supposed to take a 1/2 a tablet every Monday, Wednesday, and Friday. Please advise.

## 2019-06-11 NOTE — Telephone Encounter (Signed)
New Message  Pt c/o medication issue:  1. Name of Medication: spironolactone (ALDACTONE) 25 MG tablet  2. How are you currently taking this medication (dosage and times per day)? As written  3. Are you having a reaction (difficulty breathing--STAT)? No  4. What is your medication issue? Pt needs a prescription to get medication filled

## 2019-06-11 NOTE — Telephone Encounter (Signed)
Spoke with Donald Rasmussen at [facility] to let her know that we did recently change Spironolactone to just MWF.  Donald Rasmussen appreciative for call.

## 2019-06-16 ENCOUNTER — Ambulatory Visit: Payer: Medicare HMO | Attending: Internal Medicine

## 2019-06-16 DIAGNOSIS — Z23 Encounter for immunization: Secondary | ICD-10-CM | POA: Insufficient documentation

## 2019-06-16 NOTE — Progress Notes (Signed)
   Covid-19 Vaccination Clinic  Name:  Donald Rasmussen    MRN: [id number] DOB: 02/02/1933  06/16/2019  [name] was observed post Covid-19 immunization for 15 minutes without incidence. He was provided with Vaccine Information Sheet and instruction to access the V-Safe system.   [name] was instructed to call 911 with any severe reactions post vaccine: Marland Kitchen Difficulty breathing  . Swelling of your face and throat  . A fast heartbeat  . A bad rash all over your body  . Dizziness and weakness    Immunizations Administered    Name Date Dose VIS Date Route   Pfizer COVID-19 Vaccine 06/16/2019 11:52 AM 0.3 mL 04/12/2019 Intramuscular   Manufacturer: [place]   Lot: [id number]   [place]: [id number]

## 2019-06-26 NOTE — Progress Notes (Signed)
Telehealth Visit     Virtual Visit via Video Note   This visit type was conducted due to national recommendations for restrictions regarding the COVID-19 Pandemic (e.g. social distancing) in an effort to limit this patient's exposure and mitigate transmission in our community.  Due to his co-morbid illnesses, this patient is at least at moderate risk for complications without adequate follow up.  This format is felt to be most appropriate for this patient at this time.  All issues noted in this document were discussed and addressed.  A limited physical exam was performed with this format.  Please refer to the patient's chart for his consent to telehealth for [facility].   Evaluation Performed:  Follow-up visit  This visit type was conducted due to national recommendations for restrictions regarding the COVID-19 Pandemic (e.g. social distancing).  This format is felt to be most appropriate for this patient at this time.  All issues noted in this document were discussed and addressed.  No physical exam was performed (except for noted visual exam findings with Video Visits).  Please refer to the patient's chart (MyChart message for video visits and phone note for telephone visits) for the patient's consent to telehealth for [facility].  Date:  07/02/2019   ID:  Donald Rasmussen, DOB 03/09/1933, MRN [id number]  Patient Location:  Home  Provider location:   Home  PCP:  Lajean Manes, MD  Cardiologist:   Sinclair Grooms, MD  Electrophysiologist:  None   Chief Complaint:  Follow up  History of Present Illness:    Donald Rasmussen is a 86 y.o. male who presents via audio/video conferencing for a telehealth visit today.  Seen for Dr. Tamala Rasmussen.   He has a history of persistent AF, CAD with prior RCA stent (BMS in 1999), HTN, on chronic anticoagulation and CKD stage III.   Seen a month ago by Dr. Tamala Rasmussen - was "blissfully confused".  He had been having issues with swelling. No real improvement  with Lasix.   The patient does not have symptoms concerning for COVID-19 infection (fever, chills, cough, or new shortness of breath).   Seen today by telephone visit with the grandson "Donald Rasmussen". He has  consented for this visit. Says "they are hanging in there.  Biggest issue is the swelling - while not any worse, no better - Mr. Donald Rasmussen does not elevate consistently. Not able to put on compression stockings. His legs will weep at times - he will get some sores - he has required antibiotics in the past - Donald Rasmussen is wondering if he needs a course of this now - legs are red and weeping. His breathing seems to be ok. Donald Rasmussen noted that the family has opted for conservative measures.  No vitals able to be obtained. No other major concerns noted.   Past Medical History:  Diagnosis Date  . Cancer [facility])    prostate; Dr. Amalia Rasmussen  . Chronic kidney disease    Stg III kidney disease; GRF 55  . Chronic low back pain    2000 lumbosacral spine degenerative change. Possible calculus  . Complication of anesthesia   . Coronary artery disease    BMS 1999  . Edentulous   . Gout   . Hyperlipidemia   . Hypertension   . Impaired fasting blood sugar   . Mucous cyst of tonsil    Past Surgical History:  Procedure Laterality Date  . CARDIAC CATHETERIZATION     stent   . CARDIOVERSION  03/07/2012  Procedure: CARDIOVERSION;  Surgeon: Sinclair Grooms, MD;  Location: [place];  Service: Cardiovascular;  Laterality: N/A;  . HEMORRHOID SURGERY    . RETROPUBIC PROSTATECTOMY  1997  . sebaceous cyst back       Current Meds  Medication Sig  . acetaminophen (TYLENOL) 500 MG tablet Take 500 mg by mouth every 6 (six) hours as needed (pain).   Marland Kitchen allopurinol (ZYLOPRIM) 300 MG tablet Take 300 mg by mouth daily.  Marland Kitchen amLODipine (NORVASC) 5 MG tablet Take 1 tablet (5 mg total) by mouth daily.  Marland Kitchen apixaban (ELIQUIS) 5 MG TABS tablet Take 1 tablet (5 mg total) by mouth 2 (two) times daily.  Marland Kitchen atorvastatin (LIPITOR) 20 MG  tablet Take 1 tablet (20 mg total) by mouth daily.  . cholecalciferol (VITAMIN D) 1000 UNITS tablet Take 2,000 Units by mouth daily.  Marland Kitchen EPINEPHrine (EPIPEN JR) 0.15 MG/0.3ML injection Inject 0.15 mg into the muscle as needed for anaphylaxis.   . fish oil-omega-3 fatty acids 1000 MG capsule Take 1,200 mg by mouth daily.  . furosemide (LASIX) 20 MG tablet Take 1 tablet (20 mg total) by mouth daily.  . Melatonin 5 MG TABS Take 1 tablet by mouth at bedtime.  . metoprolol tartrate (LOPRESSOR) 25 MG tablet Take 0.5 tablets (12.5 mg total) by mouth 2 (two) times daily.  . nitroGLYCERIN (NITROSTAT) 0.4 MG SL tablet Place 1 tablet (0.4 mg total) under the tongue every 5 (five) minutes as needed for chest pain.  Marland Kitchen quinapril (ACCUPRIL) 20 MG tablet Take 1 tablet (20 mg total) by mouth daily.  Marland Kitchen spironolactone (ALDACTONE) 25 MG tablet Take 0.5 tablets (12.5 mg total) by mouth every Monday, Wednesday, and Friday.     Allergies:   Bee venom   Social History   Tobacco Use  . Smoking status: Former Research scientist (life sciences)  . Smokeless tobacco: Current User    Types: Chew  Substance Use Topics  . Alcohol use: Yes    Alcohol/week: 0.0 standard drinks  . Drug use: No     Family Hx: The patient's family history includes Heart attack in his father and mother; Hyperlipidemia in his mother.  ROS:   Please see the history of present illness.   All other systems reviewed are negative.    Objective:    Vital Signs:  There were no vitals taken for this visit.   Wt Readings from Last 3 Encounters:  05/31/19 199 lb (90.3 kg)  05/09/19 200 lb 1.9 oz (90.8 kg)  09/03/18 175 lb (79.4 kg)   The patient was not spoken to. No vitals for this visit.    Labs/Other Tests and Data Reviewed:    Lab Results  Component Value Date   WBC 6.9 05/09/2019   HGB 13.9 05/09/2019   HCT 40.3 05/09/2019   PLT 242 05/09/2019   GLUCOSE 105 (H) 06/07/2019   ALT 20 02/09/2018   AST 23 02/09/2018   NA 149 (H) 06/07/2019   K 3.5  06/07/2019   CL 107 (H) 06/07/2019   CREATININE 1.09 06/07/2019   BUN 14 06/07/2019   CO2 28 06/07/2019   TSH 0.03 (L) 11/14/2014   INR 1.9 (A) 08/14/2018        BNP (last 3 results) No results for input(s): BNP in the last 8760 hours.  ProBNP (last 3 results) Recent Labs    05/09/19 1149  PROBNP 1,174*      Prior CV studies:    The following studies were reviewed today:  ECHO IMPRESSIONS 05/2019 1. LV hypertrophy, with LV muscle appearance consistent with possible  amyloid. While diastology cannot be fully evaluated due to afib, enlarged  LA and low tissue doppler velocities consistent with possible infiltrative  process. Recommend PYP scan if  clinical concern for amyloid exists.  2. Prominent IAS bowing to right with PFO with left to right flow. Bubble  study negative for right to left shunting.  3. Left ventricular ejection fraction, by visual estimation, is 60 to  65%. The left ventricle has normal function. There is moderately increased  left ventricular hypertrophy.  4. Left ventricular diastolic function could not be evaluated.  5. The left ventricle has no regional wall motion abnormalities.  6. Global right ventricle has normal systolic function.The right  ventricular size is mildly enlarged. No increase in right ventricular wall  thickness.  7. Left atrial size was severely dilated.  8. Right atrial size was severely dilated.  9. Trivial pericardial effusion is present.  10. Moderate mitral annular calcification.  11. The mitral valve is degenerative. Mild mitral valve regurgitation.  12. The tricuspid valve is normal in structure.  13. The tricuspid valve is normal in structure. Tricuspid valve  regurgitation is moderate.  14. The aortic valve is tricuspid. Aortic valve regurgitation is mild. No  evidence of aortic valve sclerosis or stenosis.  15. Pulmonic regurgitation is mild.  16. The pulmonic valve was normal in structure. Pulmonic  valve  regurgitation is mild.  17. Aortic dilatation noted.  18. There is mild dilatation of the ascending aorta measuring 39 mm.  19. Moderately elevated pulmonary artery systolic pressure.  20. The inferior vena cava is dilated in size with <50% respiratory  variability, suggesting right atrial pressure of 15 mmHg.  21. Moderately sized patent foramen ovale with predominantly left to right  shunting across the atrial septum.  22. Evidence of atrial level shunting detected by color flow Doppler.  23. There is right bowing of the interatrial septum, suggestive of  elevated left atrial pressure.    ASSESSMENT & PLAN:    1.  Worsening edema - he has had Aldactone added to his regimen - may have amyloid - family opting for conservative measures - sounds like some early cellulitis - he has taken Keflex in the past - will send in 500 mg BID for 5 days - Donald Rasmussen notes that a BID dosing schedule would be the most realistic. Will try to see him back in the office in about 2 to 3 months. Be available as needed.   2. Persistent AF - no worrisome issues noted.   3. CAD with remote PCI - no chest pain reported.   4. Dementia  5. Chronic anticoagulation - no bleeding reported.   6. Recent echo with concern for possible amyloid - looks like there was discussion with Dr. Tamala Rasmussen - perhaps amyloid is contributing to symptoms - although mild - next step would be amyloid scan - may not be a good/practical management strategy even if amyloid was identified - therapy is expensive - does not reverse - ? Favor more conservative therapy - family agrees.   7. HTN - do not know what current BP is.   8. COVID-19 Education: The signs and symptoms of COVID-19 were discussed with the patient and how to seek care for testing (follow up with PCP or arrange E-visit).  The importance of social distancing, staying at home, hand hygiene and wearing a mask when out in public were discussed today.  Patient  Risk:   After  full review of this patient's clinical status, I feel that they are at least moderate risk at this time.  Time:   Today, I have spent 12 minutes with the patient with telehealth technology discussing the above issues.     Medication Adjustments/Labs and Tests Ordered: Current medicines are reviewed at length with the patient today.  Concerns regarding medicines are outlined above.   Tests Ordered: No orders of the defined types were placed in this encounter.   Medication Changes: No orders of the defined types were placed in this encounter.   Disposition:  FU with 2 to 3 months with me or Dr. Tamala Rasmussen with plan to recheck his lab as well. Favor conservative approach.      Patient is agreeable to this plan and will call if any problems develop in the interim.   Amie Critchley, NP  07/02/2019 1:53 PM    [facility]

## 2019-07-01 ENCOUNTER — Ambulatory Visit: Payer: Medicare HMO | Admitting: Nurse Practitioner

## 2019-07-02 ENCOUNTER — Encounter: Payer: Self-pay | Admitting: Nurse Practitioner

## 2019-07-02 ENCOUNTER — Other Ambulatory Visit: Payer: Self-pay

## 2019-07-02 ENCOUNTER — Telehealth (INDEPENDENT_AMBULATORY_CARE_PROVIDER_SITE_OTHER): Payer: Medicare HMO | Admitting: Nurse Practitioner

## 2019-07-02 DIAGNOSIS — Z7901 Long term (current) use of anticoagulants: Secondary | ICD-10-CM | POA: Diagnosis not present

## 2019-07-02 DIAGNOSIS — I4819 Other persistent atrial fibrillation: Secondary | ICD-10-CM | POA: Diagnosis not present

## 2019-07-02 DIAGNOSIS — Z7189 Other specified counseling: Secondary | ICD-10-CM

## 2019-07-02 DIAGNOSIS — R931 Abnormal findings on diagnostic imaging of heart and coronary circulation: Secondary | ICD-10-CM

## 2019-07-02 DIAGNOSIS — R609 Edema, unspecified: Secondary | ICD-10-CM

## 2019-07-02 DIAGNOSIS — R0602 Shortness of breath: Secondary | ICD-10-CM

## 2019-07-02 DIAGNOSIS — I2510 Atherosclerotic heart disease of native coronary artery without angina pectoris: Secondary | ICD-10-CM | POA: Diagnosis not present

## 2019-07-02 DIAGNOSIS — I10 Essential (primary) hypertension: Secondary | ICD-10-CM

## 2019-07-02 DIAGNOSIS — F0390 Unspecified dementia without behavioral disturbance: Secondary | ICD-10-CM | POA: Diagnosis not present

## 2019-07-02 DIAGNOSIS — I77819 Aortic ectasia, unspecified site: Secondary | ICD-10-CM | POA: Diagnosis not present

## 2019-07-02 DIAGNOSIS — R600 Localized edema: Secondary | ICD-10-CM

## 2019-07-02 DIAGNOSIS — I482 Chronic atrial fibrillation, unspecified: Secondary | ICD-10-CM | POA: Diagnosis not present

## 2019-07-02 DIAGNOSIS — M7989 Other specified soft tissue disorders: Secondary | ICD-10-CM

## 2019-07-02 MED ORDER — CEPHALEXIN 500 MG PO CAPS: 500.0000 mg | ORAL_CAPSULE | Freq: Two times a day (BID) | ORAL | 1 refills | Status: DC

## 2019-07-02 NOTE — Patient Instructions (Addendum)
After Visit Summary:  We will be checking the following labs today - NONE   Medication Instructions:    Continue with your current medicines.   I have sent in a RX for Keflex 500 mg to take twice a day for 5 days for his legs.     If you need a refill on your cardiac medications before your next appointment, please call your pharmacy.     Testing/Procedures To Be Arranged:  N/A  Follow-Up:   See [place] in about 2 to 3 months - call for any issues before then or send [place] a MyChart message.     At [facility], you and your health needs are our priority.  As part of our continuing mission to provide you with exceptional heart care, we have created designated Provider Care Teams.  These Care Teams include your primary Cardiologist (physician) and Advanced Practice Providers (APPs -  Physician Assistants and Nurse Practitioners) who all work together to provide you with the care you need, when you need it.  Special Instructions:  . Stay safe, stay home, wash your hands for at least 20 seconds and wear a mask when out in public.  . It was good to talk with you today.    Call the [place] office at [phone] if you have any questions, problems or concerns.

## 2019-07-09 ENCOUNTER — Ambulatory Visit: Payer: Medicare HMO | Attending: Internal Medicine

## 2019-07-09 DIAGNOSIS — Z23 Encounter for immunization: Secondary | ICD-10-CM

## 2019-07-09 NOTE — Progress Notes (Signed)
   Covid-19 Vaccination Clinic  Name:  Donald Rasmussen    MRN: [id number] DOB: [birth year]  07/09/2019  [name] was observed post Covid-19 immunization for 15 minutes without incident. He was provided with Vaccine Information Sheet and instruction to access the V-Safe system.   [name] was instructed to call 911 with any severe reactions post vaccine: Marland Kitchen Difficulty breathing  . Swelling of face and throat  . A fast heartbeat  . A bad rash all over body  . Dizziness and weakness   Immunizations Administered    Name Date Dose VIS Date Route   Pfizer COVID-19 Vaccine 07/09/2019  3:40 PM 0.3 mL 04/12/2019 Intramuscular   Manufacturer: [place]   Lot: [id number]   [place]: [id number]

## 2019-07-16 DIAGNOSIS — F0280 Dementia in other diseases classified elsewhere without behavioral disturbance: Secondary | ICD-10-CM | POA: Diagnosis not present

## 2019-07-16 DIAGNOSIS — I10 Essential (primary) hypertension: Secondary | ICD-10-CM | POA: Diagnosis not present

## 2019-07-16 DIAGNOSIS — E7800 Pure hypercholesterolemia, unspecified: Secondary | ICD-10-CM | POA: Diagnosis not present

## 2019-07-16 DIAGNOSIS — G301 Alzheimer's disease with late onset: Secondary | ICD-10-CM | POA: Diagnosis not present

## 2019-07-16 DIAGNOSIS — I4819 Other persistent atrial fibrillation: Secondary | ICD-10-CM | POA: Diagnosis not present

## 2019-08-02 ENCOUNTER — Telehealth: Payer: Self-pay | Admitting: Interventional Cardiology

## 2019-08-02 MED ORDER — CEPHALEXIN 500 MG PO CAPS: 500.0000 mg | ORAL_CAPSULE | Freq: Two times a day (BID) | ORAL | 0 refills | Status: AC

## 2019-08-02 NOTE — Telephone Encounter (Signed)
New message  Pt c/o swelling: STAT is pt has developed SOB within 24 hours  1) How much weight have you gained and in what time span? N/A  2) If swelling, where is the swelling located? Both Legs with blisters and sores, worse in the left leg.  3) Are you currently taking a fluid pill? Yes  4) Are you currently SOB?  No  5) Do you have a log of your daily weights (if so, list)? 195 at last appt, 202  6) Have you gained 3 pounds in a day or 5 pounds in a week? N/A  7) Have you traveled recently? No

## 2019-08-02 NOTE — Telephone Encounter (Signed)
Difficult situation.  Ok to repeat Keflex 500 mg for 10 days (not able to do more than BID dosing and realistically take).   Cecille Rubin

## 2019-08-02 NOTE — Telephone Encounter (Signed)
I spoke with patient's grandson. At last telemedicine visit patient was prescribed Keflex due to swelling and blister type sore on legs. Grandson reports legs improved some initially but now blister/sores have returned. Patient also has swelling in legs. Swelling is present all the time but seems to worsen as the day goes on. Patient tries to keep legs elevated but grand son reports patient has problems doing this all the time. He does not weigh at home.  Will forward to Truitt Merle, NP for review/recommendations.

## 2019-08-02 NOTE — Telephone Encounter (Signed)
I spoke with patient's grand son and told him Cecille Rubin has recommended Keflex 500 mg twice daily for 10 days.  Grandson requests prescription be sent to [facility] in [place].

## 2019-08-12 ENCOUNTER — Telehealth: Payer: Self-pay | Admitting: Interventional Cardiology

## 2019-08-12 NOTE — Telephone Encounter (Signed)
He needs to be seen by his primary care physician.  He may have cellulitis.  I would not increase diuretic.

## 2019-08-12 NOTE — Telephone Encounter (Signed)
Spoke with grandson and he states that pt is currently taking Furosemide 20mg  QD and Spironolactone 12.5mg  MWF.  Pt had a recent televisit with Truitt Merle, NP.  She also recently placed him on Keflex due to swelling and redness of the legs.  Grandson states legs look worse now.  No weights or vitals available as they have not been checking them. Will route to [name] for review and advisement.

## 2019-08-12 NOTE — Telephone Encounter (Signed)
Spoke with grandson and made him aware of recommendation per [name].  Advised him to have pt elevate legs as much as he can.  Donald Rasmussen is going to send pics of pts legs through MyChart for [name] to look at tomorrow.

## 2019-08-12 NOTE — Telephone Encounter (Signed)
Pt c/o swelling: STAT is pt has developed SOB within 24 hours  1) How much weight have you gained and in what time span? Donald Rasmussen is not sure   2) If swelling, where is the swelling located? legs  3) Are you currently taking a fluid pill? yes  4) Are you currently SOB? no  5) Do you have a log of your daily weights (if so, list)? No. Donald Rasmussen will try to start getting daily weights  6) Have you gained 3 pounds in a day or 5 pounds in a week? Donald Rasmussen is not sure   7) Have you traveled recently?   No  Grandson called previously in regards to the patient's swelling. The patient had some fluid like blisters that rupture  that accompanied the swelling,  so the patient was put on an antibiotic. In the past the antibiotics have helped with the sores but as of today they have not. The swelling has also not gotten better. The Grandson would like the patient to be seen to evaluate the swelling

## 2019-08-13 MED ORDER — FUROSEMIDE 20 MG PO TABS: ORAL_TABLET | ORAL | 0 refills | Status: DC

## 2019-08-13 NOTE — Telephone Encounter (Signed)
Pictures of legs received through [place].  Per [name] pt still needs to see PCP but can increase Furosemide to 40mg  QD until legs go down but no longer than a week.  Spoke with grandson and advised him of this information.  Grandson verbalized understanding and was in agreement with this plan.

## 2019-08-14 DIAGNOSIS — I10 Essential (primary) hypertension: Secondary | ICD-10-CM | POA: Diagnosis not present

## 2019-08-14 DIAGNOSIS — F0280 Dementia in other diseases classified elsewhere without behavioral disturbance: Secondary | ICD-10-CM | POA: Diagnosis not present

## 2019-08-14 DIAGNOSIS — G301 Alzheimer's disease with late onset: Secondary | ICD-10-CM | POA: Diagnosis not present

## 2019-08-14 DIAGNOSIS — I4819 Other persistent atrial fibrillation: Secondary | ICD-10-CM | POA: Diagnosis not present

## 2019-08-14 DIAGNOSIS — E7800 Pure hypercholesterolemia, unspecified: Secondary | ICD-10-CM | POA: Diagnosis not present

## 2019-08-15 ENCOUNTER — Telehealth: Payer: Self-pay | Admitting: Hospice

## 2019-08-15 DIAGNOSIS — F0280 Dementia in other diseases classified elsewhere without behavioral disturbance: Secondary | ICD-10-CM | POA: Diagnosis not present

## 2019-08-15 DIAGNOSIS — L97821 Non-pressure chronic ulcer of other part of left lower leg limited to breakdown of skin: Secondary | ICD-10-CM | POA: Diagnosis not present

## 2019-08-15 DIAGNOSIS — L97811 Non-pressure chronic ulcer of other part of right lower leg limited to breakdown of skin: Secondary | ICD-10-CM | POA: Diagnosis not present

## 2019-08-15 DIAGNOSIS — E8581 Light chain (AL) amyloidosis: Secondary | ICD-10-CM | POA: Diagnosis not present

## 2019-08-15 DIAGNOSIS — G301 Alzheimer's disease with late onset: Secondary | ICD-10-CM | POA: Diagnosis not present

## 2019-08-15 DIAGNOSIS — I4819 Other persistent atrial fibrillation: Secondary | ICD-10-CM | POA: Diagnosis not present

## 2019-08-15 DIAGNOSIS — D6869 Other thrombophilia: Secondary | ICD-10-CM | POA: Diagnosis not present

## 2019-08-15 DIAGNOSIS — I872 Venous insufficiency (chronic) (peripheral): Secondary | ICD-10-CM | POA: Diagnosis not present

## 2019-08-15 DIAGNOSIS — I10 Essential (primary) hypertension: Secondary | ICD-10-CM | POA: Diagnosis not present

## 2019-08-15 DIAGNOSIS — I83028 Varicose veins of left lower extremity with ulcer other part of lower leg: Secondary | ICD-10-CM | POA: Diagnosis not present

## 2019-08-15 NOTE — Telephone Encounter (Signed)
Phone call placed to patient to offer to schedule a visit with [facility]. Phone rang, with no answer I left a voicemail for call back. 

## 2019-08-23 DIAGNOSIS — L97811 Non-pressure chronic ulcer of other part of right lower leg limited to breakdown of skin: Secondary | ICD-10-CM | POA: Diagnosis not present

## 2019-08-23 DIAGNOSIS — N183 Chronic kidney disease, stage 3 unspecified: Secondary | ICD-10-CM | POA: Diagnosis not present

## 2019-08-23 DIAGNOSIS — L97821 Non-pressure chronic ulcer of other part of left lower leg limited to breakdown of skin: Secondary | ICD-10-CM | POA: Diagnosis not present

## 2019-08-23 DIAGNOSIS — I129 Hypertensive chronic kidney disease with stage 1 through stage 4 chronic kidney disease, or unspecified chronic kidney disease: Secondary | ICD-10-CM | POA: Diagnosis not present

## 2019-08-23 DIAGNOSIS — R238 Other skin changes: Secondary | ICD-10-CM | POA: Diagnosis not present

## 2019-08-23 DIAGNOSIS — L03115 Cellulitis of right lower limb: Secondary | ICD-10-CM | POA: Diagnosis not present

## 2019-08-23 DIAGNOSIS — L03116 Cellulitis of left lower limb: Secondary | ICD-10-CM | POA: Diagnosis not present

## 2019-08-23 DIAGNOSIS — I83018 Varicose veins of right lower extremity with ulcer other part of lower leg: Secondary | ICD-10-CM | POA: Diagnosis not present

## 2019-08-23 DIAGNOSIS — I83028 Varicose veins of left lower extremity with ulcer other part of lower leg: Secondary | ICD-10-CM | POA: Diagnosis not present

## 2019-08-30 DIAGNOSIS — L03115 Cellulitis of right lower limb: Secondary | ICD-10-CM | POA: Diagnosis not present

## 2019-08-30 DIAGNOSIS — R238 Other skin changes: Secondary | ICD-10-CM | POA: Diagnosis not present

## 2019-08-30 DIAGNOSIS — N183 Chronic kidney disease, stage 3 unspecified: Secondary | ICD-10-CM | POA: Diagnosis not present

## 2019-08-30 DIAGNOSIS — L97821 Non-pressure chronic ulcer of other part of left lower leg limited to breakdown of skin: Secondary | ICD-10-CM | POA: Diagnosis not present

## 2019-08-30 DIAGNOSIS — I129 Hypertensive chronic kidney disease with stage 1 through stage 4 chronic kidney disease, or unspecified chronic kidney disease: Secondary | ICD-10-CM | POA: Diagnosis not present

## 2019-08-30 DIAGNOSIS — I83028 Varicose veins of left lower extremity with ulcer other part of lower leg: Secondary | ICD-10-CM | POA: Diagnosis not present

## 2019-08-30 DIAGNOSIS — I83018 Varicose veins of right lower extremity with ulcer other part of lower leg: Secondary | ICD-10-CM | POA: Diagnosis not present

## 2019-08-30 DIAGNOSIS — L03116 Cellulitis of left lower limb: Secondary | ICD-10-CM | POA: Diagnosis not present

## 2019-08-30 DIAGNOSIS — L97811 Non-pressure chronic ulcer of other part of right lower leg limited to breakdown of skin: Secondary | ICD-10-CM | POA: Diagnosis not present

## 2019-09-02 ENCOUNTER — Telehealth: Payer: Self-pay

## 2019-09-02 ENCOUNTER — Other Ambulatory Visit: Payer: Medicare HMO | Admitting: Hospice

## 2019-09-02 ENCOUNTER — Other Ambulatory Visit: Payer: Self-pay

## 2019-09-02 DIAGNOSIS — Z515 Encounter for palliative care: Secondary | ICD-10-CM

## 2019-09-02 DIAGNOSIS — F0390 Unspecified dementia without behavioral disturbance: Secondary | ICD-10-CM

## 2019-09-02 DIAGNOSIS — I2510 Atherosclerotic heart disease of native coronary artery without angina pectoris: Secondary | ICD-10-CM

## 2019-09-02 NOTE — Telephone Encounter (Signed)
Phone call placed to patient to introduce Palliative care and offer to schedule a visit. Patient unable to hear on phone. Will call daughter

## 2019-09-02 NOTE — Telephone Encounter (Signed)
Phone call placed to patient's daughter to introduce Palliative care and to offer to schedule visit with NP. Verbal consent obtained. Visit scheduled for today @ 3pm

## 2019-09-02 NOTE — Progress Notes (Signed)
[place] Consult Note Telephone: [phone]  Fax: [phone]  PATIENT NAME: Donald Rasmussen DOB: 1932-06-03 MRN: [id number]  PRIMARY CARE PROVIDER:   Lajean Manes, MD  RESPONSIBLE PARTYBethel Rasmussen [phone] - best number to call.  Phone at home: [phone]   RECOMMENDATIONS/PLAN:   Advance Care Planning/Goals of Care: Visit at the request of Dr Lajean Manes for palliative consult. Visit consisted of building trust and discussions on Palliative Medicine as specialized medical care for people living with serious illness, aimed at facilitating better quality of life through symptoms relief, assisting with advance care plan and establishing goals of care. [name] affirmed patient is a Full code but will reconsider to change status. Family is interested Hospice services when patient qualifies for it.  Maybe wanted to know what additional services patient will get when he becomes a hospice patient.  NP explained that a broader interdisciplinary care comprising nurse case manager, social worker, nursing aide, chaplain, and hospice physicians is part of the components of hospice service.  She verbalized understanding and expressed appreciation for the information.  Goals of care include to maximize quality of life and symptom management.  Symptom management: Memory loss and confusion related to dementia FAST 6c, occasional urinary incontinence.  May be explained that sometimes patient does not recognize some family members.  He does not even remember that his wife passed away last year February 17, 2023.  Patient in no acute distress today.  He denies chest pain, coughing, shortness of breath.[name] and other family members come often on daily basis to visit and help patient. Patient is able to bath himself; feeds self after set up; he lives alone with his dog.  Patient continues on Allopurinol for Gout, Amlodipine, Artovastatin for HTN/CAD. He is on  Furosemide for bil lower extremity edema. Edema improved per daughter. Coban wrap to bil lower leg.  Home health nurse comes weekly to change the dressing.  Patient has Afib with long term anticoagulation- Eliquis. No bleeding, no syncope. He is compliant with his medications. He takes Melatonin 5mg  at bedtime for sleep; effective.  Patient's appetite is good, no recent weight changes.  Patient and [name] with no concerns at this time.  Follow up: Palliative care will continue to follow patient for goals of care clarification and symptom management. I spent one hour 46 minutes providing this consultation; time includes chart review and documentation. More than 50% of the time in this consultation was spent on coordinating communication  HISTORY OF PRESENT ILLNESS:  Donald Rasmussen is a 86 y.o. year old male with multiple medical problems including  Afib CAD HTN Dementia.Patient in full remission for prostate CA.  Palliative Care was asked to help address goals of care.   CODE STATUS: DNR  PPS: 50% HOSPICE ELIGIBILITY/DIAGNOSIS: TBD  PAST MEDICAL HISTORY:  Past Medical History:  Diagnosis Date  . Cancer [facility])    prostate; Dr. Amalia Hailey  . Chronic kidney disease    Stg III kidney disease; GRF 55  . Chronic low back pain    2000 lumbosacral spine degenerative change. Possible calculus  . Complication of anesthesia   . Coronary artery disease    BMS 1999  . Edentulous   . Gout   . Hyperlipidemia   . Hypertension   . Impaired fasting blood sugar   . Mucous cyst of tonsil     SOCIAL HX:  Social History   Tobacco Use  . Smoking status: Former  Smoker  . Smokeless tobacco: Current User    Types: Chew  Substance Use Topics  . Alcohol use: Yes    Alcohol/week: 0.0 standard drinks    ALLERGIES:  Allergies  Allergen Reactions  . Bee Venom Swelling     PERTINENT MEDICATIONS:  Outpatient Encounter Medications as of 09/02/2019  Medication Sig  . acetaminophen (TYLENOL) 500 MG tablet Take 500  mg by mouth every 6 (six) hours as needed (pain).   Marland Kitchen allopurinol (ZYLOPRIM) 300 MG tablet Take 300 mg by mouth daily.  Marland Kitchen amLODipine (NORVASC) 5 MG tablet Take 1 tablet (5 mg total) by mouth daily.  Marland Kitchen apixaban (ELIQUIS) 5 MG TABS tablet Take 1 tablet (5 mg total) by mouth 2 (two) times daily.  Marland Kitchen atorvastatin (LIPITOR) 20 MG tablet Take 1 tablet (20 mg total) by mouth daily.  . cholecalciferol (VITAMIN D) 1000 UNITS tablet Take 2,000 Units by mouth daily.  Marland Kitchen EPINEPHrine (EPIPEN JR) 0.15 MG/0.3ML injection Inject 0.15 mg into the muscle as needed for anaphylaxis.   . fish oil-omega-3 fatty acids 1000 MG capsule Take 1,200 mg by mouth daily.  . furosemide (LASIX) 20 MG tablet Take 1 tablet (20 mg total) by mouth daily.  . furosemide (LASIX) 20 MG tablet Take one tablet by mouth daily for 7 days, then discontinue.  . Melatonin 5 MG TABS Take 1 tablet by mouth at bedtime.  . metoprolol tartrate (LOPRESSOR) 25 MG tablet Take 0.5 tablets (12.5 mg total) by mouth 2 (two) times daily.  . nitroGLYCERIN (NITROSTAT) 0.4 MG SL tablet Place 1 tablet (0.4 mg total) under the tongue every 5 (five) minutes as needed for chest pain.  Marland Kitchen quinapril (ACCUPRIL) 20 MG tablet Take 1 tablet (20 mg total) by mouth daily.  Marland Kitchen spironolactone (ALDACTONE) 25 MG tablet Take 0.5 tablets (12.5 mg total) by mouth every Monday, Wednesday, and Friday.   No facility-administered encounter medications on file as of 09/02/2019.    PHYSICAL EXAM/ROS:  General: NAD, pleasant, cooperative Cardiovascular: irregular rate and rhythm; denies chest pain/discomfort Pulmonary: clear ant/post fields, normal respiratory effort Abdomen: soft, nontender, + bowel sounds GU: no suprapubic tenderness Extremities:BLE edema, coban wrap in place, clean dry and intact.  Skin: no rashes on exposed skin. Fragile skin that tears easily Neurological: Weakness but otherwise nonfocal  Teodoro Spray, NP

## 2019-09-03 ENCOUNTER — Other Ambulatory Visit: Payer: Self-pay | Admitting: Interventional Cardiology

## 2019-09-03 DIAGNOSIS — R238 Other skin changes: Secondary | ICD-10-CM | POA: Diagnosis not present

## 2019-09-03 DIAGNOSIS — L97811 Non-pressure chronic ulcer of other part of right lower leg limited to breakdown of skin: Secondary | ICD-10-CM | POA: Diagnosis not present

## 2019-09-03 DIAGNOSIS — I83018 Varicose veins of right lower extremity with ulcer other part of lower leg: Secondary | ICD-10-CM | POA: Diagnosis not present

## 2019-09-03 DIAGNOSIS — L97821 Non-pressure chronic ulcer of other part of left lower leg limited to breakdown of skin: Secondary | ICD-10-CM | POA: Diagnosis not present

## 2019-09-03 DIAGNOSIS — L03116 Cellulitis of left lower limb: Secondary | ICD-10-CM | POA: Diagnosis not present

## 2019-09-03 DIAGNOSIS — I83028 Varicose veins of left lower extremity with ulcer other part of lower leg: Secondary | ICD-10-CM | POA: Diagnosis not present

## 2019-09-03 DIAGNOSIS — N183 Chronic kidney disease, stage 3 unspecified: Secondary | ICD-10-CM | POA: Diagnosis not present

## 2019-09-03 DIAGNOSIS — L03115 Cellulitis of right lower limb: Secondary | ICD-10-CM | POA: Diagnosis not present

## 2019-09-03 DIAGNOSIS — I129 Hypertensive chronic kidney disease with stage 1 through stage 4 chronic kidney disease, or unspecified chronic kidney disease: Secondary | ICD-10-CM | POA: Diagnosis not present

## 2019-09-13 DIAGNOSIS — L03115 Cellulitis of right lower limb: Secondary | ICD-10-CM | POA: Diagnosis not present

## 2019-09-13 DIAGNOSIS — L97811 Non-pressure chronic ulcer of other part of right lower leg limited to breakdown of skin: Secondary | ICD-10-CM | POA: Diagnosis not present

## 2019-09-13 DIAGNOSIS — L97821 Non-pressure chronic ulcer of other part of left lower leg limited to breakdown of skin: Secondary | ICD-10-CM | POA: Diagnosis not present

## 2019-09-13 DIAGNOSIS — N183 Chronic kidney disease, stage 3 unspecified: Secondary | ICD-10-CM | POA: Diagnosis not present

## 2019-09-13 DIAGNOSIS — L03116 Cellulitis of left lower limb: Secondary | ICD-10-CM | POA: Diagnosis not present

## 2019-09-13 DIAGNOSIS — I83028 Varicose veins of left lower extremity with ulcer other part of lower leg: Secondary | ICD-10-CM | POA: Diagnosis not present

## 2019-09-13 DIAGNOSIS — R238 Other skin changes: Secondary | ICD-10-CM | POA: Diagnosis not present

## 2019-09-13 DIAGNOSIS — I83018 Varicose veins of right lower extremity with ulcer other part of lower leg: Secondary | ICD-10-CM | POA: Diagnosis not present

## 2019-09-13 DIAGNOSIS — I129 Hypertensive chronic kidney disease with stage 1 through stage 4 chronic kidney disease, or unspecified chronic kidney disease: Secondary | ICD-10-CM | POA: Diagnosis not present

## 2019-09-18 NOTE — Progress Notes (Signed)
CARDIOLOGY OFFICE NOTE  Date:  09/24/2019    Donald Rasmussen Date of Birth: Jul 12, 1932 Medical Record [id number]  PCP:  Lajean Manes, MD  Cardiologist:  Jennings Books    Chief Complaint  Patient presents with  . Follow-up    Seen for [name]    History of Present Illness: Donald Rasmussen is a 86 y.o. male who presents today for a follow up visit. He is seen for [name].   He has a history of persistent AF, CAD with prior RCA stent (BMS in 1999), HTN, on chronic anticoagulation and CKD stage III.   Last seen by [name] back in January - was "blissfully confused". He had been having issues with swelling. No real improvement with Lasix. I did a telehealth visit with them in March - swelling remained the biggest issue. Not able to use compression stockings. Legs will weep at times. Gave round of antibiotics for what sounded like cellulitis. Family had opted for conservative measures.   The patient does not have symptoms concerning for COVID-19 infection (fever, chills, cough, or new shortness of breath).   Comes in today. Here with his step daughter today [name]. Legs still weeping. Can't use compression stockings. More weakness overall - can't stand for very long. He is by himself most of the time. Family are in and out of the house during the day. May complain sometimes of shortness of breath - especially with activity. Weight ranging 200 to 205 at home. They try to restrict his salt - but he likes chips/doritos. She is asking about Palliative Care.   Past Medical History:  Diagnosis Date  . Atrial fibrillation ([facility]) 03/07/2012   Unknown duration but associated with CHF   . CAD (coronary artery disease), native coronary artery 03/07/2012   Prior MI with BMS RCA   . Cancer [facility])    prostate; Dr. Amalia Hailey  . Chronic kidney disease    Stg III kidney disease; GRF 55  . Chronic low back pain    2000 lumbosacral spine degenerative change. Possible calculus  .  Complication of anesthesia   . Coronary artery disease    BMS 1999  . Edentulous   . Essential hypertension 03/07/2012  . Gout   . History of amiodarone therapy 08/12/2013  . Hyperlipidemia   . Hypertension   . Impaired fasting blood sugar   . Long term current use of anticoagulant therapy 08/12/2013  . Mucous cyst of tonsil     Past Surgical History:  Procedure Laterality Date  . CARDIAC CATHETERIZATION     stent   . CARDIOVERSION  03/07/2012   Procedure: CARDIOVERSION;  Surgeon: Sinclair Grooms, MD;  Location: [facility];  Service: Cardiovascular;  Laterality: N/A;  . HEMORRHOID SURGERY    . RETROPUBIC PROSTATECTOMY  1997  . sebaceous cyst back       Medications: Current Meds  Medication Sig  . acetaminophen (TYLENOL) 500 MG tablet Take 500 mg by mouth every 6 (six) hours as needed (pain).   Marland Kitchen allopurinol (ZYLOPRIM) 300 MG tablet Take 300 mg by mouth daily.  Marland Kitchen apixaban (ELIQUIS) 5 MG TABS tablet Take 1 tablet (5 mg total) by mouth 2 (two) times daily.  Marland Kitchen atorvastatin (LIPITOR) 20 MG tablet Take 1 tablet (20 mg total) by mouth daily.  . cholecalciferol (VITAMIN D) 1000 UNITS tablet Take 2,000 Units by mouth daily.  Marland Kitchen EPINEPHrine (EPIPEN JR) 0.15 MG/0.3ML injection Inject 0.15 mg into the muscle  as needed for anaphylaxis.   . fish oil-omega-3 fatty acids 1000 MG capsule Take 1,200 mg by mouth daily.  . furosemide (LASIX) 20 MG tablet Take 1 tablet (20 mg total) by mouth daily. Take 2 pills for 3 days and then back to one pill daily  . Melatonin 5 MG TABS Take 1 tablet by mouth at bedtime.  . metoprolol tartrate (LOPRESSOR) 25 MG tablet Take 0.5 tablets (12.5 mg total) by mouth 2 (two) times daily.  . nitroGLYCERIN (NITROSTAT) 0.4 MG SL tablet Place 1 tablet (0.4 mg total) under the tongue every 5 (five) minutes as needed for chest pain.  Marland Kitchen quinapril (ACCUPRIL) 20 MG tablet TAKE ONE TABLET BY MOUTH EVERY MORNING  . spironolactone (ALDACTONE) 25 MG tablet Take 0.5 tablets (12.5  mg total) by mouth every Monday, Wednesday, and Friday.  . [DISCONTINUED] amLODipine (NORVASC) 5 MG tablet TAKE ONE TABLET BY MOUTH EVERY MORNING  . [DISCONTINUED] furosemide (LASIX) 20 MG tablet Take 1 tablet (20 mg total) by mouth daily.     Allergies: Allergies  Allergen Reactions  . Bee Venom Swelling    Social History: The patient  reports that he has quit smoking. His smokeless tobacco use includes chew. He reports current alcohol use. He reports that he does not use drugs.   Family History: The patient's family history includes Heart attack in his father and mother; Hyperlipidemia in his mother.   Review of Systems: Please see the history of present illness.   All other systems are reviewed and negative.   Physical Exam: VS:  BP 120/70   Pulse 80   Ht 6\' 4"  (1.93 m)   Wt 208 lb 12.8 oz (94.7 kg)   SpO2 96%   BMI 25.42 kg/m  .  BMI Body mass index is 25.42 kg/m.  Wt Readings from Last 3 Encounters:  09/24/19 208 lb 12.8 oz (94.7 kg)  05/31/19 199 lb (90.3 kg)  05/09/19 200 lb 1.9 oz (90.8 kg)    General: Alert and in no acute distress. He is confused but pleasant.   Has chewing tobacco in his pocket.   Cardiac: Irregular irregular rhythm. His rate is ok. Still with 2 + lower leg edema - he has blisters on the lower right leg - see below.  Respiratory:  Lungs are fairly clear to auscultation bilaterally with normal work of breathing.  GI: Soft and nontender.  MS: No deformity or atrophy. Gait and ROM intact.  Skin: Warm and dry. Color is normal.  Neuro:  Strength and sensation are intact and no gross focal deficits noted.  Psych: Alert, appropriate and with normal affect.     LABORATORY DATA:  EKG:  EKG is not ordered today.    Lab Results  Component Value Date   WBC 6.9 05/09/2019   HGB 13.9 05/09/2019   HCT 40.3 05/09/2019   PLT 242 05/09/2019   GLUCOSE 105 (H) 06/07/2019   ALT 20 02/09/2018   AST 23 02/09/2018   NA 149 (H) 06/07/2019   K 3.5  06/07/2019   CL 107 (H) 06/07/2019   CREATININE 1.09 06/07/2019   BUN 14 06/07/2019   CO2 28 06/07/2019   TSH 0.03 (L) 11/14/2014   INR 1.9 (A) 08/14/2018       BNP (last 3 results) No results for input(s): BNP in the last 8760 hours.  ProBNP (last 3 results) Recent Labs    05/09/19 1149  PROBNP 1,174*     Other Studies Reviewed Today:  ECHO  IMPRESSIONS 05/2019 1. LV hypertrophy, with LV muscle appearance consistent with possible  amyloid. While diastology cannot be fully evaluated due to afib, enlarged  LA and low tissue doppler velocities consistent with possible infiltrative  process. Recommend PYP scan if  clinical concern for amyloid exists.  2. Prominent IAS bowing to right with PFO with left to right flow. Bubble  study negative for right to left shunting.  3. Left ventricular ejection fraction, by visual estimation, is 60 to  65%. The left ventricle has normal function. There is moderately increased  left ventricular hypertrophy.  4. Left ventricular diastolic function could not be evaluated.  5. The left ventricle has no regional wall motion abnormalities.  6. Global right ventricle has normal systolic function.The right  ventricular size is mildly enlarged. No increase in right ventricular wall  thickness.  7. Left atrial size was severely dilated.  8. Right atrial size was severely dilated.  9. Trivial pericardial effusion is present.  10. Moderate mitral annular calcification.  11. The mitral valve is degenerative. Mild mitral valve regurgitation.  12. The tricuspid valve is normal in structure.  13. The tricuspid valve is normal in structure. Tricuspid valve  regurgitation is moderate.  14. The aortic valve is tricuspid. Aortic valve regurgitation is mild. No  evidence of aortic valve sclerosis or stenosis.  15. Pulmonic regurgitation is mild.  16. The pulmonic valve was normal in structure. Pulmonic valve  regurgitation is mild.  17. Aortic  dilatation noted.  18. There is mild dilatation of the ascending aorta measuring 39 mm.  19. Moderately elevated pulmonary artery systolic pressure.  20. The inferior vena cava is dilated in size with <50% respiratory  variability, suggesting right atrial pressure of 15 mmHg.  21. Moderately sized patent foramen ovale with predominantly left to right  shunting across the atrial septum.  22. Evidence of atrial level shunting detected by color flow Doppler.  23. There is right bowing of the interatrial septum, suggestive of  elevated left atrial pressure.    ASSESSMENT & PLAN:    1.  Persistent lower extremity edema - may be aggravated by chronic Norvasc use - BP is 100/70 by me - stopping Norvasc today. Increase Lasix to 40 mg for 3 days - then back to 20 mg a day. We will check lab today - could consider increasing Aldactone on return if needed.   2. Persistent AF - managed with rate control and Eliquis.   3. Chronic anticoagulation - lab today.   4. Dementia - "blissfully confused".   5. Known CAD with remote PCI - he has DOE - does not sound like this is any worse - family favoring comfort care - asking for Palliative Care.   6. Recent echo - concerning for amyloid - family has opted for conservative care.   7. HTN - BP pretty soft on my check - stopping Norvasc today.   8. COVID-19 Education: The signs and symptoms of COVID-19 were discussed with the patient and how to seek care for testing (follow up with PCP or arrange E-visit).  The importance of social distancing, staying at home, hand hygiene and wearing a mask when out in public were discussed today.  Current medicines are reviewed with the patient today.  The patient does not have concerns regarding medicines other than what has been noted above.  The following changes have been made:  See above.  Labs/ tests ordered today include:    Orders Placed This Encounter  Procedures  .  Basic metabolic panel  . CBC  .  Amb Referral to Palliative Care     Disposition:   FU with me in one month.    Patient is agreeable to this plan and will call if any problems develop in the interim.   SignedTruitt Merle, NP  09/24/2019 2:12 PM  [facility] [address] [place], [place]  [address] Phone: [phone] Fax: [phone]

## 2019-09-24 ENCOUNTER — Other Ambulatory Visit: Payer: Self-pay

## 2019-09-24 ENCOUNTER — Ambulatory Visit: Payer: Medicare HMO | Admitting: Nurse Practitioner

## 2019-09-24 ENCOUNTER — Encounter: Payer: Self-pay | Admitting: Nurse Practitioner

## 2019-09-24 VITALS — BP 120/70 | HR 80 | Ht 76.0 in | Wt 208.8 lb

## 2019-09-24 DIAGNOSIS — R600 Localized edema: Secondary | ICD-10-CM | POA: Diagnosis not present

## 2019-09-24 DIAGNOSIS — R0602 Shortness of breath: Secondary | ICD-10-CM | POA: Diagnosis not present

## 2019-09-24 DIAGNOSIS — I482 Chronic atrial fibrillation, unspecified: Secondary | ICD-10-CM | POA: Diagnosis not present

## 2019-09-24 DIAGNOSIS — I77819 Aortic ectasia, unspecified site: Secondary | ICD-10-CM | POA: Diagnosis not present

## 2019-09-24 DIAGNOSIS — F0390 Unspecified dementia without behavioral disturbance: Secondary | ICD-10-CM | POA: Diagnosis not present

## 2019-09-24 MED ORDER — FUROSEMIDE 20 MG PO TABS: 20.0000 mg | ORAL_TABLET | Freq: Every day | ORAL | 3 refills | Status: DC

## 2019-09-24 NOTE — Patient Instructions (Addendum)
After Visit Summary:  We will be checking the following labs today - BMET, CBC,    Medication Instructions:    Continue with your current medicines. BUT  STOP NORVASC  INCREASE LASIX TO 2 PILLS FOR 3 DAYS - THEN BACK TO ONE A DAY   If you need a refill on your cardiac medications before your next appointment, please call your pharmacy.     Testing/Procedures To Be Arranged:  N/A  Follow-Up:   See me in about one month    At [facility], you and your health needs are our priority.  As part of our continuing mission to provide you with exceptional heart care, we have created designated Provider Care Teams.  These Care Teams include your primary Cardiologist (physician) and Advanced Practice Providers (APPs -  Physician Assistants and Nurse Practitioners) who all work together to provide you with the care you need, when you need it.  Special Instructions:  . Stay safe, stay home, wash your hands for at least 20 seconds and wear a mask when out in public.  . It was good to talk with you today.  . I have placed an order for Palliative Care - they should be in contact with the family.    Call the [place] office at [phone] if you have any questions, problems or concerns.

## 2019-09-25 LAB — CBC
Hematocrit: 41.1 % (ref 37.5–51.0)
Hemoglobin: 13.8 g/dL (ref 13.0–17.7)
MCH: 31.2 pg (ref 26.6–33.0)
MCHC: 33.6 g/dL (ref 31.5–35.7)
MCV: 93 fL (ref 79–97)
Platelets: 218 10*3/uL (ref 150–450)
RBC: 4.42 x10E6/uL (ref 4.14–5.80)
RDW: 12.5 % (ref 11.6–15.4)
WBC: 7.3 10*3/uL (ref 3.4–10.8)

## 2019-09-25 LAB — BASIC METABOLIC PANEL
BUN/Creatinine Ratio: 9 — ABNORMAL LOW (ref 10–24)
BUN: 11 mg/dL (ref 8–27)
CO2: 29 mmol/L (ref 20–29)
Calcium: 9.8 mg/dL (ref 8.6–10.2)
Chloride: 104 mmol/L (ref 96–106)
Creatinine, Ser: 1.17 mg/dL (ref 0.76–1.27)
GFR calc Af Amer: 66 mL/min/{1.73_m2} (ref >59)
GFR calc non Af Amer: 57 mL/min/{1.73_m2} — ABNORMAL LOW (ref >59)
Glucose: 98 mg/dL (ref 65–99)
Potassium: 3.9 mmol/L (ref 3.5–5.2)
Sodium: 148 mmol/L — ABNORMAL HIGH (ref 134–144)

## 2019-09-27 ENCOUNTER — Other Ambulatory Visit: Payer: Self-pay

## 2019-09-27 DIAGNOSIS — L97821 Non-pressure chronic ulcer of other part of left lower leg limited to breakdown of skin: Secondary | ICD-10-CM | POA: Diagnosis not present

## 2019-09-27 DIAGNOSIS — N183 Chronic kidney disease, stage 3 unspecified: Secondary | ICD-10-CM | POA: Diagnosis not present

## 2019-09-27 DIAGNOSIS — I83018 Varicose veins of right lower extremity with ulcer other part of lower leg: Secondary | ICD-10-CM | POA: Diagnosis not present

## 2019-09-27 DIAGNOSIS — E7800 Pure hypercholesterolemia, unspecified: Secondary | ICD-10-CM | POA: Diagnosis not present

## 2019-09-27 DIAGNOSIS — G301 Alzheimer's disease with late onset: Secondary | ICD-10-CM | POA: Diagnosis not present

## 2019-09-27 DIAGNOSIS — I4891 Unspecified atrial fibrillation: Secondary | ICD-10-CM | POA: Diagnosis not present

## 2019-09-27 DIAGNOSIS — I10 Essential (primary) hypertension: Secondary | ICD-10-CM | POA: Diagnosis not present

## 2019-09-27 DIAGNOSIS — R238 Other skin changes: Secondary | ICD-10-CM | POA: Diagnosis not present

## 2019-09-27 DIAGNOSIS — I83028 Varicose veins of left lower extremity with ulcer other part of lower leg: Secondary | ICD-10-CM | POA: Diagnosis not present

## 2019-09-27 DIAGNOSIS — I129 Hypertensive chronic kidney disease with stage 1 through stage 4 chronic kidney disease, or unspecified chronic kidney disease: Secondary | ICD-10-CM | POA: Diagnosis not present

## 2019-09-27 DIAGNOSIS — L03116 Cellulitis of left lower limb: Secondary | ICD-10-CM | POA: Diagnosis not present

## 2019-09-27 DIAGNOSIS — L97811 Non-pressure chronic ulcer of other part of right lower leg limited to breakdown of skin: Secondary | ICD-10-CM | POA: Diagnosis not present

## 2019-09-27 DIAGNOSIS — L03115 Cellulitis of right lower limb: Secondary | ICD-10-CM | POA: Diagnosis not present

## 2019-09-27 DIAGNOSIS — F0280 Dementia in other diseases classified elsewhere without behavioral disturbance: Secondary | ICD-10-CM | POA: Diagnosis not present

## 2019-09-27 MED ORDER — FUROSEMIDE 20 MG PO TABS: 20.0000 mg | ORAL_TABLET | Freq: Every day | ORAL | 0 refills | Status: DC

## 2019-09-27 NOTE — Telephone Encounter (Signed)
[facility] calling stating that pt needs 3 pills of furosemide 20 mg tablets sent to pt's local pharmacy so pt medication that is packed by [facility] would not be interrupted, because pt's medication furosemide was increased for 3 days and pt needed an additional 3 tablets. Rx sent. Confirmation received.

## 2019-10-04 DIAGNOSIS — L97811 Non-pressure chronic ulcer of other part of right lower leg limited to breakdown of skin: Secondary | ICD-10-CM | POA: Diagnosis not present

## 2019-10-04 DIAGNOSIS — I83018 Varicose veins of right lower extremity with ulcer other part of lower leg: Secondary | ICD-10-CM | POA: Diagnosis not present

## 2019-10-04 DIAGNOSIS — L97821 Non-pressure chronic ulcer of other part of left lower leg limited to breakdown of skin: Secondary | ICD-10-CM | POA: Diagnosis not present

## 2019-10-04 DIAGNOSIS — N183 Chronic kidney disease, stage 3 unspecified: Secondary | ICD-10-CM | POA: Diagnosis not present

## 2019-10-04 DIAGNOSIS — L03116 Cellulitis of left lower limb: Secondary | ICD-10-CM | POA: Diagnosis not present

## 2019-10-04 DIAGNOSIS — L03115 Cellulitis of right lower limb: Secondary | ICD-10-CM | POA: Diagnosis not present

## 2019-10-04 DIAGNOSIS — I83028 Varicose veins of left lower extremity with ulcer other part of lower leg: Secondary | ICD-10-CM | POA: Diagnosis not present

## 2019-10-04 DIAGNOSIS — R238 Other skin changes: Secondary | ICD-10-CM | POA: Diagnosis not present

## 2019-10-04 DIAGNOSIS — I129 Hypertensive chronic kidney disease with stage 1 through stage 4 chronic kidney disease, or unspecified chronic kidney disease: Secondary | ICD-10-CM | POA: Diagnosis not present

## 2019-10-07 ENCOUNTER — Other Ambulatory Visit: Payer: Self-pay | Admitting: Interventional Cardiology

## 2019-10-07 DIAGNOSIS — I10 Essential (primary) hypertension: Secondary | ICD-10-CM | POA: Diagnosis not present

## 2019-10-07 DIAGNOSIS — I4819 Other persistent atrial fibrillation: Secondary | ICD-10-CM | POA: Diagnosis not present

## 2019-10-07 DIAGNOSIS — G301 Alzheimer's disease with late onset: Secondary | ICD-10-CM | POA: Diagnosis not present

## 2019-10-07 DIAGNOSIS — E7800 Pure hypercholesterolemia, unspecified: Secondary | ICD-10-CM | POA: Diagnosis not present

## 2019-10-07 DIAGNOSIS — F0280 Dementia in other diseases classified elsewhere without behavioral disturbance: Secondary | ICD-10-CM | POA: Diagnosis not present

## 2019-10-07 DIAGNOSIS — J439 Emphysema, unspecified: Secondary | ICD-10-CM | POA: Diagnosis not present

## 2019-10-07 MED ORDER — SPIRONOLACTONE 25 MG PO TABS: 12.5000 mg | ORAL_TABLET | ORAL | 0 refills | Status: DC

## 2019-10-07 NOTE — Telephone Encounter (Signed)
*  STAT* If patient is at the pharmacy, call can be transferred to refill team.   1. Which medications need to be refilled? (please list name of each medication and dose if known)  Spironolactone  2. Which pharmacy/location (including street and city if local pharmacy) is medication to be sent to? [facility]  3. Do they need a 30 day or 90 day supply? 30 days and refills

## 2019-10-07 NOTE — Telephone Encounter (Signed)
**Note De-Identified  Obfuscation** Spironolactone refill sent to [facility] to fill as requested. #15 with no refills. Message to pt that he must keep his 10/28/19 f/u to continue refills.

## 2019-10-11 DIAGNOSIS — N183 Chronic kidney disease, stage 3 unspecified: Secondary | ICD-10-CM | POA: Diagnosis not present

## 2019-10-11 DIAGNOSIS — L97811 Non-pressure chronic ulcer of other part of right lower leg limited to breakdown of skin: Secondary | ICD-10-CM | POA: Diagnosis not present

## 2019-10-11 DIAGNOSIS — L97821 Non-pressure chronic ulcer of other part of left lower leg limited to breakdown of skin: Secondary | ICD-10-CM | POA: Diagnosis not present

## 2019-10-11 DIAGNOSIS — L03115 Cellulitis of right lower limb: Secondary | ICD-10-CM | POA: Diagnosis not present

## 2019-10-11 DIAGNOSIS — L03116 Cellulitis of left lower limb: Secondary | ICD-10-CM | POA: Diagnosis not present

## 2019-10-11 DIAGNOSIS — I83028 Varicose veins of left lower extremity with ulcer other part of lower leg: Secondary | ICD-10-CM | POA: Diagnosis not present

## 2019-10-11 DIAGNOSIS — I83018 Varicose veins of right lower extremity with ulcer other part of lower leg: Secondary | ICD-10-CM | POA: Diagnosis not present

## 2019-10-11 DIAGNOSIS — I129 Hypertensive chronic kidney disease with stage 1 through stage 4 chronic kidney disease, or unspecified chronic kidney disease: Secondary | ICD-10-CM | POA: Diagnosis not present

## 2019-10-11 DIAGNOSIS — R238 Other skin changes: Secondary | ICD-10-CM | POA: Diagnosis not present

## 2019-10-14 ENCOUNTER — Other Ambulatory Visit: Payer: Medicare HMO | Admitting: Hospice

## 2019-10-14 ENCOUNTER — Other Ambulatory Visit: Payer: Self-pay

## 2019-10-14 DIAGNOSIS — F0390 Unspecified dementia without behavioral disturbance: Secondary | ICD-10-CM

## 2019-10-14 DIAGNOSIS — I2510 Atherosclerotic heart disease of native coronary artery without angina pectoris: Secondary | ICD-10-CM

## 2019-10-14 DIAGNOSIS — Z515 Encounter for palliative care: Secondary | ICD-10-CM | POA: Diagnosis not present

## 2019-10-14 NOTE — Progress Notes (Signed)
Donald Rasmussen Consult Note Telephone: [phone]  Fax: [phone]  PATIENT NAME: Donald Rasmussen DOB: Jan 07, 1933 MRN: [id number]  PRIMARY CARE PROVIDER:   Lajean Manes, MD  RESPONSIBLE PARTY:  Donald Rasmussen [phone] - grand son; Bethel Born [phone] - best number to call.  Phone at home: [phone]    RECOMMENDATIONS/PLAN:   Advance Care Planning/Goals of Care:  Visit consisted of building trust and discussions on Palliative Medicine as specialized medical care for people living with serious illness, aimed at facilitating better quality of life through symptoms relief, assisting with advance care plan and establishing goals of care.  Review of CODE STATUS discussion today with patient and Donald Rasmussen.  Donald Rasmussen agreed it might be time to change patient's CODE STATUS but he will have discussions with him and other family members and in next visit the answer will be available.  At this time, patient is a FULL CODE. Family is interested Hospice services when patient qualifies for it.  Goals of care include to maximize quality of life and symptom management.  Strong family support system in place for patient: Donald Rasmussen and Donald Rasmussen taking turns to visit patient daily to provide care and make sure he takes his medications as ordered. Symptom management:  No significant decline since last visit.  No fall, no hospitalization, no medical acuity.  Memory loss and confusion related to dementia FAST 6c, occasional urinary incontinence. Patient denies chest pain, coughing, shortness of breath; he endorsed shortness of breath at moderate exertion.  Patient is able to bath himself; feeds self after set up; he lives alone with his dog.  Education provided on pacing activities, rest in between activities.  Patient continues on Allopurinol for Gout,  Artovastatin. He is on Furosemide for bil lower extremity edema. Edema greatly improved.  Home care stopped  coming last week for Coban wrap dressings.  Patient has Afib with long term anticoagulation- Eliquis. No bleeding, no syncope. Patient off Amlodipine after last Cardiologist visit 3 weeks ago, per Donald Rasmussen. He is compliant with his medications. He takes Melatonin 5mg  at bedtime for sleep; effective.  Patient's appetite is good, no recent weight changes. Encouraged ongoing care.  Follow up: Palliative care will continue to follow patient for goals of care clarification and symptom management. I spent one hour 25 minutes providing this consultation; time includes chart review and documentation. More than 50% of the time in this consultation was spent on coordinating communication  HISTORY OF PRESENT ILLNESS:  Donald Rasmussen is a 86 y.o. year old male with multiple medical problems including  Afib CAD HTN Dementia.Patient in full remission for prostate CA.  Palliative Care was asked to help address goals of care.   CODE STATUS: DNR   PPS: 50% HOSPICE ELIGIBILITY/DIAGNOSIS: TBD  PAST MEDICAL HISTORY:  Past Medical History:  Diagnosis Date  . Atrial fibrillation ([place]) 03/07/2012   Unknown duration but associated with CHF   . CAD (coronary artery disease), native coronary artery 03/07/2012   Prior MI with BMS RCA   . Cancer [facility])    prostate; Dr. Amalia Rasmussen  . Chronic kidney disease    Stg III kidney disease; GRF 55  . Chronic low back pain    2000 lumbosacral spine degenerative change. Possible calculus  . Complication of anesthesia   . Coronary artery disease    BMS 1999  . Edentulous   . Essential hypertension 03/07/2012  . Gout   . History of  amiodarone therapy 08/12/2013  . Hyperlipidemia   . Hypertension   . Impaired fasting blood sugar   . Long term current use of anticoagulant therapy 08/12/2013  . Mucous cyst of tonsil     SOCIAL HX:  Social History   Tobacco Use  . Smoking status: Former Research scientist (life sciences)  . Smokeless tobacco: Current User    Types: Chew  Substance Use Topics  . Alcohol  use: Yes    Alcohol/week: 0.0 standard drinks    ALLERGIES:  Allergies  Allergen Reactions  . Bee Venom Swelling     PERTINENT MEDICATIONS:  Outpatient Encounter Medications as of 10/14/2019  Medication Sig  . acetaminophen (TYLENOL) 500 MG tablet Take 500 mg by mouth every 6 (six) hours as needed (pain).   Marland Kitchen allopurinol (ZYLOPRIM) 300 MG tablet Take 300 mg by mouth daily.  Marland Kitchen apixaban (ELIQUIS) 5 MG TABS tablet Take 1 tablet (5 mg total) by mouth 2 (two) times daily.  Marland Kitchen atorvastatin (LIPITOR) 20 MG tablet Take 1 tablet (20 mg total) by mouth daily.  . cholecalciferol (VITAMIN D) 1000 UNITS tablet Take 2,000 Units by mouth daily.  Marland Kitchen EPINEPHrine (EPIPEN JR) 0.15 MG/0.3ML injection Inject 0.15 mg into the muscle as needed for anaphylaxis.   . fish oil-omega-3 fatty acids 1000 MG capsule Take 1,200 mg by mouth daily.  . furosemide (LASIX) 20 MG tablet Take 1 tablet (20 mg total) by mouth daily. Take 2 pills for 3 days and then back to one pill daily  . Melatonin 5 MG TABS Take 1 tablet by mouth at bedtime.  . metoprolol tartrate (LOPRESSOR) 25 MG tablet Take 0.5 tablets (12.5 mg total) by mouth 2 (two) times daily.  . nitroGLYCERIN (NITROSTAT) 0.4 MG SL tablet Place 1 tablet (0.4 mg total) under the tongue every 5 (five) minutes as needed for chest pain.  Marland Kitchen quinapril (ACCUPRIL) 20 MG tablet TAKE ONE TABLET BY MOUTH EVERY MORNING  . spironolactone (ALDACTONE) 25 MG tablet Take 0.5 tablets (12.5 mg total) by mouth every Monday, Wednesday, and Friday. Must keep f/u on 10/28/19 to cont refills. Thank you.   No facility-administered encounter medications on file as of 10/14/2019.    PHYSICAL EXAM/ROS:  General: NAD, cooperative Cardiovascular: regular rate and rhythm; denies chest pain Pulmonary: clear ant/post fields; normal respiratory effort, no adventitious sounds auscultated Abdomen: soft, nontender, + bowel sounds GU: no suprapubic tenderness Extremities: Edema improved; 1+ pitting  edema in bilateral lower extremities Skin: no rashes to exposed skin Neurological: Weakness but otherwise nonfocal  Teodoro Spray, NP

## 2019-10-18 DIAGNOSIS — L03115 Cellulitis of right lower limb: Secondary | ICD-10-CM | POA: Diagnosis not present

## 2019-10-18 DIAGNOSIS — I83028 Varicose veins of left lower extremity with ulcer other part of lower leg: Secondary | ICD-10-CM | POA: Diagnosis not present

## 2019-10-18 DIAGNOSIS — L03116 Cellulitis of left lower limb: Secondary | ICD-10-CM | POA: Diagnosis not present

## 2019-10-18 DIAGNOSIS — L97821 Non-pressure chronic ulcer of other part of left lower leg limited to breakdown of skin: Secondary | ICD-10-CM | POA: Diagnosis not present

## 2019-10-18 DIAGNOSIS — N183 Chronic kidney disease, stage 3 unspecified: Secondary | ICD-10-CM | POA: Diagnosis not present

## 2019-10-18 DIAGNOSIS — I129 Hypertensive chronic kidney disease with stage 1 through stage 4 chronic kidney disease, or unspecified chronic kidney disease: Secondary | ICD-10-CM | POA: Diagnosis not present

## 2019-10-18 DIAGNOSIS — R238 Other skin changes: Secondary | ICD-10-CM | POA: Diagnosis not present

## 2019-10-18 DIAGNOSIS — L97811 Non-pressure chronic ulcer of other part of right lower leg limited to breakdown of skin: Secondary | ICD-10-CM | POA: Diagnosis not present

## 2019-10-18 DIAGNOSIS — I83018 Varicose veins of right lower extremity with ulcer other part of lower leg: Secondary | ICD-10-CM | POA: Diagnosis not present

## 2019-10-22 NOTE — Progress Notes (Signed)
CARDIOLOGY OFFICE NOTE  Date:  10/28/2019    Donald Rasmussen Date of Birth: May 31, 1932 Medical Record #[id number]  PCP:  Lajean Manes, MD  Cardiologist:  Jennings Books  Chief Complaint  Patient presents with  . Follow-up    Seen for [name]    History of Present Illness: Donald Rasmussen is a 86 y.o. male who presents today for a one month check. He is seen for [name].   He has a history of persistent AF, CAD with prior RCA stent (BMS in 1999), HTN, on chronic anticoagulation and CKD stage III.   Last seen by [name] back in January 2021- was "blissfullyconfused".He had been having issues with swelling. No real improvement with Lasix.I did a telehealth visit with them in March - swelling remained the biggest issue. Not able to use compression stockings. Legs will weep at times. Gave round of antibiotics for what sounded like cellulitis. Family had opted for conservative measures.   I then saw him last month - legs still weeping. More weakness. Was by himself most of the time with family in and out. They try to restrict his salt but he likes chips/doritos. Family asked about Palliative Care and order was place. He was on high dose Norvasc which I stopped.   The patient does not have symptoms concerning for COVID-19 infection (fever, chills, cough, or new shortness of breath).   Comes in today. Here with family member. Just had his medicines about 30 minutes today. BP is running around 150 at home. Pleasantly confused. Loves salt. Family checks on him 3 times a day. Palliative care is now on board. Legs look better. No more weeping. Swelling has improved. Weight is down 2 pounds.   Past Medical History:  Diagnosis Date  . Atrial fibrillation ([place]) 03/07/2012   Unknown duration but associated with CHF   . CAD (coronary artery disease), native coronary artery 03/07/2012   Prior MI with BMS RCA   . Cancer [facility])    prostate; Dr. Amalia Hailey  . Chronic kidney disease    Stg  III kidney disease; GRF 55  . Chronic low back pain    2000 lumbosacral spine degenerative change. Possible calculus  . Complication of anesthesia   . Coronary artery disease    BMS 1999  . Edentulous   . Essential hypertension 03/07/2012  . Gout   . History of amiodarone therapy 08/12/2013  . Hyperlipidemia   . Hypertension   . Impaired fasting blood sugar   . Long term current use of anticoagulant therapy 08/12/2013  . Mucous cyst of tonsil     Past Surgical History:  Procedure Laterality Date  . CARDIAC CATHETERIZATION     stent   . CARDIOVERSION  03/07/2012   Procedure: CARDIOVERSION;  Surgeon: Sinclair Grooms, MD;  Location: [facility];  Service: Cardiovascular;  Laterality: N/A;  . HEMORRHOID SURGERY    . RETROPUBIC PROSTATECTOMY  1997  . sebaceous cyst back       Medications: Current Meds  Medication Sig  . acetaminophen (TYLENOL) 500 MG tablet Take 500 mg by mouth every 6 (six) hours as needed (pain).   Marland Kitchen allopurinol (ZYLOPRIM) 300 MG tablet Take 300 mg by mouth daily.  Marland Kitchen apixaban (ELIQUIS) 5 MG TABS tablet Take 1 tablet (5 mg total) by mouth 2 (two) times daily.  Marland Kitchen atorvastatin (LIPITOR) 20 MG tablet Take 1 tablet (20 mg total) by mouth daily.  . cholecalciferol (VITAMIN D) 1000  UNITS tablet Take 2,000 Units by mouth daily.  Marland Kitchen EPINEPHrine (EPIPEN JR) 0.15 MG/0.3ML injection Inject 0.15 mg into the muscle as needed for anaphylaxis.   . fish oil-omega-3 fatty acids 1000 MG capsule Take 1,200 mg by mouth daily.  . furosemide (LASIX) 20 MG tablet Take 1 tablet (20 mg total) by mouth daily. Take 2 pills for 3 days and then back to one pill daily  . Melatonin 5 MG TABS Take 1 tablet by mouth at bedtime.  . metoprolol tartrate (LOPRESSOR) 25 MG tablet Take 0.5 tablets (12.5 mg total) by mouth 2 (two) times daily.  . nitroGLYCERIN (NITROSTAT) 0.4 MG SL tablet Place 1 tablet (0.4 mg total) under the tongue every 5 (five) minutes as needed for chest pain.  Marland Kitchen quinapril  (ACCUPRIL) 20 MG tablet TAKE ONE TABLET BY MOUTH EVERY MORNING  . spironolactone (ALDACTONE) 25 MG tablet Take 0.5 tablets (12.5 mg total) by mouth every Monday, Wednesday, and Friday.  . [DISCONTINUED] spironolactone (ALDACTONE) 25 MG tablet Take 0.5 tablets (12.5 mg total) by mouth every Monday, Wednesday, and Friday. Must keep f/u on 10/28/19 to cont refills. Thank you.     Allergies: Allergies  Allergen Reactions  . Bee Venom Swelling    Social History: The patient  reports that he has quit smoking. His smokeless tobacco use includes chew. He reports current alcohol use. He reports that he does not use drugs.   Family History: The patient's family history includes Heart attack in his father and mother; Hyperlipidemia in his mother.   Review of Systems: Please see the history of present illness.   All other systems are reviewed and negative.   Physical Exam: VS:  BP (!) 176/92   Pulse 86   Ht 6\' 4"  (1.93 m)   Wt 206 lb 9.6 oz (93.7 kg)   SpO2 94%   BMI 25.15 kg/m  .  BMI Body mass index is 25.15 kg/m.  Wt Readings from Last 3 Encounters:  10/28/19 206 lb 9.6 oz (93.7 kg)  09/24/19 208 lb 12.8 oz (94.7 kg)  05/31/19 199 lb (90.3 kg)   BP is 140/60 by me.   General: Elderly. Alert - pleasantly confused - He is in no acute distress. Has chewing tobacco in his pocket.   Cardiac: Irregular irregular rhythm. Rate is fine. Much less edema of his legs.  Respiratory:  Lungs are clear to auscultation bilaterally with normal work of breathing.  GI: Soft and nontender.  MS: No deformity or atrophy. Gait and ROM intact.  Skin: Warm and dry. Color is normal.  Neuro:  Strength and sensation are intact and no gross focal deficits noted.  Psych: Alert, appropriate and with normal affect.   LABORATORY DATA:  EKG:  EKG is not ordered today.   Lab Results  Component Value Date   WBC 7.3 09/24/2019   HGB 13.8 09/24/2019   HCT 41.1 09/24/2019   PLT 218 09/24/2019   GLUCOSE 98  09/24/2019   ALT 20 02/09/2018   AST 23 02/09/2018   NA 148 (H) 09/24/2019   K 3.9 09/24/2019   CL 104 09/24/2019   CREATININE 1.17 09/24/2019   BUN 11 09/24/2019   CO2 29 09/24/2019   TSH 0.03 (L) 11/14/2014   INR 1.9 (A) 08/14/2018     BNP (last 3 results) No results for input(s): BNP in the last 8760 hours.  ProBNP (last 3 results) Recent Labs    05/09/19 1149  PROBNP 1,174*     Other  Studies Reviewed Today:  ECHOIMPRESSIONS1/2021 1. LV hypertrophy, with LV muscle appearance consistent with possible  amyloid. While diastology cannot be fully evaluated due to afib, enlarged  LA and low tissue doppler velocities consistent with possible infiltrative  process. Recommend PYP scan if  clinical concern for amyloid exists.  2. Prominent IAS bowing to right with PFO with left to right flow. Bubble  study negative for right to left shunting.  3. Left ventricular ejection fraction, by visual estimation, is 60 to  65%. The left ventricle has normal function. There is moderately increased  left ventricular hypertrophy.  4. Left ventricular diastolic function could not be evaluated.  5. The left ventricle has no regional wall motion abnormalities.  6. Global right ventricle has normal systolic function.The right  ventricular size is mildly enlarged. No increase in right ventricular wall  thickness.  7. Left atrial size was severely dilated.  8. Right atrial size was severely dilated.  9. Trivial pericardial effusion is present.  10. Moderate mitral annular calcification.  11. The mitral valve is degenerative. Mild mitral valve regurgitation.  12. The tricuspid valve is normal in structure.  13. The tricuspid valve is normal in structure. Tricuspid valve  regurgitation is moderate.  14. The aortic valve is tricuspid. Aortic valve regurgitation is mild. No  evidence of aortic valve sclerosis or stenosis.  15. Pulmonic regurgitation is mild.  16. The pulmonic valve  was normal in structure. Pulmonic valve  regurgitation is mild.  17. Aortic dilatation noted.  18. There is mild dilatation of the ascending aorta measuring 39 mm.  19. Moderately elevated pulmonary artery systolic pressure.  20. The inferior vena cava is dilated in size with <50% respiratory  variability, suggesting right atrial pressure of 15 mmHg.  21. Moderately sized patent foramen ovale with predominantly left to right  shunting across the atrial septum.  22. Evidence of atrial level shunting detected by color flow Doppler.  23. There is right bowing of the interatrial septum, suggestive of  elevated left atrial pressure.    ASSESSMENT &PLAN:    1. Persistent AF - managed with rate control and Eliquis.   2. Dementia - unchanged.  3. Persistent lower extremity edema - swelling has improved with stopping Norvasc.   4. HTN - recheck by me is better - stable at home - would follow for now. Just had his AM medicines right before coming here this afternoon. Could increase Aldactone if needed - for now no changes.   5. Chronic anticoagulation - lab last month.   6. Known CAD - remote PCI - no worrisome symptoms.   7. Echo concerning Amyloid - family opting for conservative care - now with Palliative care.   Current medicines are reviewed with the patient today.  The patient does not have concerns regarding medicines other than what has been noted above.  The following changes have been made:  See above.  Labs/ tests ordered today include:   No orders of the defined types were placed in this encounter.    Disposition:   FU with [place] in about 4 months. Conservative care.    Patient is agreeable to this plan and will call if any problems develop in the interim.   SignedTruitt Merle, NP  10/28/2019 2:03 PM  [facility] [address] [place], [place]  [address] Phone: [phone] Fax: [phone]

## 2019-10-28 ENCOUNTER — Other Ambulatory Visit: Payer: Self-pay

## 2019-10-28 ENCOUNTER — Encounter: Payer: Self-pay | Admitting: Nurse Practitioner

## 2019-10-28 ENCOUNTER — Ambulatory Visit: Payer: Medicare HMO | Admitting: Nurse Practitioner

## 2019-10-28 VITALS — BP 176/92 | HR 86 | Ht 76.0 in | Wt 206.6 lb

## 2019-10-28 DIAGNOSIS — I2510 Atherosclerotic heart disease of native coronary artery without angina pectoris: Secondary | ICD-10-CM

## 2019-10-28 DIAGNOSIS — R0602 Shortness of breath: Secondary | ICD-10-CM

## 2019-10-28 DIAGNOSIS — I482 Chronic atrial fibrillation, unspecified: Secondary | ICD-10-CM

## 2019-10-28 DIAGNOSIS — R600 Localized edema: Secondary | ICD-10-CM | POA: Diagnosis not present

## 2019-10-28 DIAGNOSIS — I10 Essential (primary) hypertension: Secondary | ICD-10-CM | POA: Diagnosis not present

## 2019-10-28 MED ORDER — SPIRONOLACTONE 25 MG PO TABS: 12.5000 mg | ORAL_TABLET | ORAL | 3 refills | Status: DC

## 2019-10-28 NOTE — Patient Instructions (Addendum)
After Visit Summary:  We will be checking the following labs today - NONE   Medication Instructions:    Continue with your current medicines.    If you need a refill on your cardiac medications before your next appointment, please call your pharmacy.     Testing/Procedures To Be Arranged:  N/A  Follow-Up:   See me in about 3 to 4 months    At [facility], you and your health needs are our priority.  As part of our continuing mission to provide you with exceptional heart care, we have created designated Provider Care Teams.  These Care Teams include your primary Cardiologist (physician) and Advanced Practice Providers (APPs -  Physician Assistants and Nurse Practitioners) who all work together to provide you with the care you need, when you need it.  Special Instructions:  . Stay safe, wash your hands for at least 20 seconds and wear a mask when needed.  . It was good to talk with you today.  . Call if BP staying above 180 or higher consistently   Call the [place] office at [phone] if you have any questions, problems or concerns.

## 2019-11-05 ENCOUNTER — Other Ambulatory Visit: Payer: Self-pay

## 2019-11-05 MED ORDER — SPIRONOLACTONE 25 MG PO TABS: 12.5000 mg | ORAL_TABLET | ORAL | 3 refills | Status: AC

## 2019-11-05 NOTE — Telephone Encounter (Signed)
Pt's medication was sent to pt's preferred pharmacy as requested by pt's doctor's office at [place]. Confirmation received.

## 2019-11-07 DIAGNOSIS — I10 Essential (primary) hypertension: Secondary | ICD-10-CM | POA: Diagnosis not present

## 2019-11-07 DIAGNOSIS — E7800 Pure hypercholesterolemia, unspecified: Secondary | ICD-10-CM | POA: Diagnosis not present

## 2019-11-07 DIAGNOSIS — I4891 Unspecified atrial fibrillation: Secondary | ICD-10-CM | POA: Diagnosis not present

## 2019-11-07 DIAGNOSIS — J439 Emphysema, unspecified: Secondary | ICD-10-CM | POA: Diagnosis not present

## 2019-11-07 DIAGNOSIS — G301 Alzheimer's disease with late onset: Secondary | ICD-10-CM | POA: Diagnosis not present

## 2019-11-08 DIAGNOSIS — R269 Unspecified abnormalities of gait and mobility: Secondary | ICD-10-CM | POA: Diagnosis not present

## 2019-11-08 DIAGNOSIS — F0280 Dementia in other diseases classified elsewhere without behavioral disturbance: Secondary | ICD-10-CM | POA: Diagnosis not present

## 2019-11-08 DIAGNOSIS — D692 Other nonthrombocytopenic purpura: Secondary | ICD-10-CM | POA: Diagnosis not present

## 2019-11-08 DIAGNOSIS — D6869 Other thrombophilia: Secondary | ICD-10-CM | POA: Diagnosis not present

## 2019-11-08 DIAGNOSIS — Z79899 Other long term (current) drug therapy: Secondary | ICD-10-CM | POA: Diagnosis not present

## 2019-11-08 DIAGNOSIS — R739 Hyperglycemia, unspecified: Secondary | ICD-10-CM | POA: Diagnosis not present

## 2019-11-08 DIAGNOSIS — I872 Venous insufficiency (chronic) (peripheral): Secondary | ICD-10-CM | POA: Diagnosis not present

## 2019-11-08 DIAGNOSIS — I10 Essential (primary) hypertension: Secondary | ICD-10-CM | POA: Diagnosis not present

## 2019-11-08 DIAGNOSIS — G301 Alzheimer's disease with late onset: Secondary | ICD-10-CM | POA: Diagnosis not present

## 2019-11-08 DIAGNOSIS — I4819 Other persistent atrial fibrillation: Secondary | ICD-10-CM | POA: Diagnosis not present

## 2019-11-25 ENCOUNTER — Other Ambulatory Visit: Payer: Medicare HMO | Admitting: Hospice

## 2019-11-25 ENCOUNTER — Other Ambulatory Visit: Payer: Self-pay

## 2019-11-25 DIAGNOSIS — Z515 Encounter for palliative care: Secondary | ICD-10-CM

## 2019-11-25 DIAGNOSIS — F0390 Unspecified dementia without behavioral disturbance: Secondary | ICD-10-CM

## 2019-11-25 NOTE — Progress Notes (Signed)
[facility] Consult Note Telephone: [phone]  Fax: [phone]  PATIENT NAME:Donald Rasmussen DOB:1933-03-30 OVF:[id number]  PRIMARY CARE PROVIDER:[place], Christiane Ha, MD  RESPONSIBLE PARTY:Donald Rasmussen [phone] - grand son; Bethel Born [phone] - best number to call. Donald Rasmussen - grandson [phone] Phone at home: [phone]    RECOMMENDATIONS/PLAN:  Advance Care Planning/Goals of Care:  Visit consisted of building trust and further  discussions on Palliative Medicine as specialized medical care for people living with serious illness, aimed at facilitating better quality of life through symptoms relief, assisting with advance care plan and establishing goals of care.  Extensive discussion on CODE STATUS today, Donald joined via teleconference.  Patient's CODE STATUS changed today to DO NOT RESUSCITATE.  NP signed a DNR form for patient; same uploaded to epic. Family is interested in Hospice services when patient qualifies for it.Goals of care include to maximize quality of life and symptom management.  Strong family support system in place for patient: Donald Rasmussen and Donald Rasmussen taking turns to visit patient daily to provide care and make sure he takes his medications as ordered. Follow GB:[id number] care will continue to follow patient for goals of care clarification and symptom management.  Next visit scheduled in 2 months. Symptom management:  Donald Rasmussen reported fall related to ongoing weakness and gait problem; no injuries.  Patient is not using his rolling walker.  Education provided on the need for walker to help provide support and help prevent a fall.  Donald Rasmussen  brought out patient's walker for easy access.   No hospitalization, no medical acuity.  Ongoing memory loss and confusion related to dementiaFAST 6c, occasional urinary incontinence.Patient denies chest pain, coughing, shortness of breath; he endorsed shortness of  breath at moderate exertion.  He continues on furosemide for bilateral lower extremity edema-  Improved, no  more weeping. Feet elevation reiterated.  He is compliant with his medications.  Patient has Afib with long term anticoagulation- Eliquis. No bleeding, no syncope.  He takes Melatonin 5mg  at bedtime for sleep; effective. Patient's appetite is good. Encouraged ongoing care. I spentone hour 16 minutesproviding this consultation; time includes time with patient/family, chart review and documentation. More than 50% of the time in this consultation was spent on coordinating communication  HISTORY OF PRESENT ILLNESS:Donald T McGeeis a 86 y.o.year oldmalewith multiple medical problems including Afib CAD HTNDementia.Patient in full remission for prostate CA.Palliative Care was asked to help address goals of care.   CODE STATUS:DNR   PPS: 50% HOSPICE ELIGIBILITY/DIAGNOSIS: TBD  PAST MEDICAL HISTORY:  Past Medical History:  Diagnosis Date  . Atrial fibrillation ([place]) 03/07/2012   Unknown duration but associated with CHF   . CAD (coronary artery disease), native coronary artery 03/07/2012   Prior MI with BMS RCA   . Cancer [facility])    prostate; Dr. Amalia Hailey  . Chronic kidney disease    Stg III kidney disease; GRF 55  . Chronic low back pain    2000 lumbosacral spine degenerative change. Possible calculus  . Complication of anesthesia   . Coronary artery disease    BMS 1999  . Edentulous   . Essential hypertension 03/07/2012  . Gout   . History of amiodarone therapy 08/12/2013  . Hyperlipidemia   . Hypertension   . Impaired fasting blood sugar   . Long term current use of anticoagulant therapy 08/12/2013  . Mucous cyst of tonsil     SOCIAL HX:  Social History  Tobacco Use  . Smoking status: Former Research scientist (life sciences)  . Smokeless tobacco: Current User    Types: Chew  Substance Use Topics  . Alcohol use: Yes    Alcohol/week: 0.0 standard drinks    ALLERGIES:  Allergies    Allergen Reactions  . Bee Venom Swelling     PERTINENT MEDICATIONS:  Outpatient Encounter Medications as of 11/25/2019  Medication Sig  . acetaminophen (TYLENOL) 500 MG tablet Take 500 mg by mouth every 6 (six) hours as needed (pain).   Marland Kitchen allopurinol (ZYLOPRIM) 300 MG tablet Take 300 mg by mouth daily.  Marland Kitchen apixaban (ELIQUIS) 5 MG TABS tablet Take 1 tablet (5 mg total) by mouth 2 (two) times daily.  Marland Kitchen atorvastatin (LIPITOR) 20 MG tablet Take 1 tablet (20 mg total) by mouth daily.  . cholecalciferol (VITAMIN D) 1000 UNITS tablet Take 2,000 Units by mouth daily.  Marland Kitchen EPINEPHrine (EPIPEN JR) 0.15 MG/0.3ML injection Inject 0.15 mg into the muscle as needed for anaphylaxis.   . fish oil-omega-3 fatty acids 1000 MG capsule Take 1,200 mg by mouth daily.  . furosemide (LASIX) 20 MG tablet Take 1 tablet (20 mg total) by mouth daily. Take 2 pills for 3 days and then back to one pill daily  . Melatonin 5 MG TABS Take 1 tablet by mouth at bedtime.  . metoprolol tartrate (LOPRESSOR) 25 MG tablet Take 0.5 tablets (12.5 mg total) by mouth 2 (two) times daily.  . nitroGLYCERIN (NITROSTAT) 0.4 MG SL tablet Place 1 tablet (0.4 mg total) under the tongue every 5 (five) minutes as needed for chest pain.  Marland Kitchen quinapril (ACCUPRIL) 20 MG tablet TAKE ONE TABLET BY MOUTH EVERY MORNING  . spironolactone (ALDACTONE) 25 MG tablet Take 0.5 tablets (12.5 mg total) by mouth every Monday, Wednesday, and Friday.   No facility-administered encounter medications on file as of 11/25/2019.    PHYSICAL EXAM/ROS:  General: NAD, cooperative Cardiovascular: regular rate and rhythm; denies chest pain Pulmonary: clear ant/post fields; normal respiratory effort, no adventitious sounds auscultated Abdomen: soft, nontender, + bowel sounds GU: no suprapubic tenderness Extremities: Edema improved; non pitting pitting edema in bilateral lower extremities Skin: no rashes to exposed skin; scabs from previous blisters from edema on bilateral  lower extremities Neurological: Weakness but otherwise nonfocal  Donald Spray, NP

## 2019-11-26 ENCOUNTER — Other Ambulatory Visit: Payer: Self-pay | Admitting: Interventional Cardiology

## 2019-11-26 NOTE — Telephone Encounter (Signed)
Eliquis 5mg  refill request received. Patient is 86 years old, weight-93.7kg, Crea-1.17 on 09/24/2019, Diagnosis-Afib, and last seen by Cecille Rubin NP on 10/28/2019. Dose is appropriate based on dosing criteria. Will send in refill to requested pharmacy.

## 2019-12-06 DIAGNOSIS — S80822D Blister (nonthermal), left lower leg, subsequent encounter: Secondary | ICD-10-CM | POA: Diagnosis not present

## 2019-12-06 DIAGNOSIS — J439 Emphysema, unspecified: Secondary | ICD-10-CM | POA: Diagnosis not present

## 2019-12-06 DIAGNOSIS — I129 Hypertensive chronic kidney disease with stage 1 through stage 4 chronic kidney disease, or unspecified chronic kidney disease: Secondary | ICD-10-CM | POA: Diagnosis not present

## 2019-12-06 DIAGNOSIS — I4891 Unspecified atrial fibrillation: Secondary | ICD-10-CM | POA: Diagnosis not present

## 2019-12-06 DIAGNOSIS — N183 Chronic kidney disease, stage 3 unspecified: Secondary | ICD-10-CM | POA: Diagnosis not present

## 2019-12-06 DIAGNOSIS — S80821D Blister (nonthermal), right lower leg, subsequent encounter: Secondary | ICD-10-CM | POA: Diagnosis not present

## 2019-12-06 DIAGNOSIS — I8393 Asymptomatic varicose veins of bilateral lower extremities: Secondary | ICD-10-CM | POA: Diagnosis not present

## 2019-12-06 DIAGNOSIS — G301 Alzheimer's disease with late onset: Secondary | ICD-10-CM | POA: Diagnosis not present

## 2019-12-06 DIAGNOSIS — F0281 Dementia in other diseases classified elsewhere with behavioral disturbance: Secondary | ICD-10-CM | POA: Diagnosis not present

## 2019-12-11 DIAGNOSIS — F0281 Dementia in other diseases classified elsewhere with behavioral disturbance: Secondary | ICD-10-CM | POA: Diagnosis not present

## 2019-12-11 DIAGNOSIS — J439 Emphysema, unspecified: Secondary | ICD-10-CM | POA: Diagnosis not present

## 2019-12-11 DIAGNOSIS — N183 Chronic kidney disease, stage 3 unspecified: Secondary | ICD-10-CM | POA: Diagnosis not present

## 2019-12-11 DIAGNOSIS — S80822D Blister (nonthermal), left lower leg, subsequent encounter: Secondary | ICD-10-CM | POA: Diagnosis not present

## 2019-12-11 DIAGNOSIS — G301 Alzheimer's disease with late onset: Secondary | ICD-10-CM | POA: Diagnosis not present

## 2019-12-11 DIAGNOSIS — I8393 Asymptomatic varicose veins of bilateral lower extremities: Secondary | ICD-10-CM | POA: Diagnosis not present

## 2019-12-11 DIAGNOSIS — S80821D Blister (nonthermal), right lower leg, subsequent encounter: Secondary | ICD-10-CM | POA: Diagnosis not present

## 2019-12-11 DIAGNOSIS — I4891 Unspecified atrial fibrillation: Secondary | ICD-10-CM | POA: Diagnosis not present

## 2019-12-11 DIAGNOSIS — I129 Hypertensive chronic kidney disease with stage 1 through stage 4 chronic kidney disease, or unspecified chronic kidney disease: Secondary | ICD-10-CM | POA: Diagnosis not present

## 2019-12-13 DIAGNOSIS — J439 Emphysema, unspecified: Secondary | ICD-10-CM | POA: Diagnosis not present

## 2019-12-13 DIAGNOSIS — N183 Chronic kidney disease, stage 3 unspecified: Secondary | ICD-10-CM | POA: Diagnosis not present

## 2019-12-13 DIAGNOSIS — F0281 Dementia in other diseases classified elsewhere with behavioral disturbance: Secondary | ICD-10-CM | POA: Diagnosis not present

## 2019-12-13 DIAGNOSIS — G301 Alzheimer's disease with late onset: Secondary | ICD-10-CM | POA: Diagnosis not present

## 2019-12-13 DIAGNOSIS — I4891 Unspecified atrial fibrillation: Secondary | ICD-10-CM | POA: Diagnosis not present

## 2019-12-13 DIAGNOSIS — I129 Hypertensive chronic kidney disease with stage 1 through stage 4 chronic kidney disease, or unspecified chronic kidney disease: Secondary | ICD-10-CM | POA: Diagnosis not present

## 2019-12-13 DIAGNOSIS — S80821D Blister (nonthermal), right lower leg, subsequent encounter: Secondary | ICD-10-CM | POA: Diagnosis not present

## 2019-12-13 DIAGNOSIS — I8393 Asymptomatic varicose veins of bilateral lower extremities: Secondary | ICD-10-CM | POA: Diagnosis not present

## 2019-12-13 DIAGNOSIS — S80822D Blister (nonthermal), left lower leg, subsequent encounter: Secondary | ICD-10-CM | POA: Diagnosis not present

## 2019-12-17 DIAGNOSIS — I8393 Asymptomatic varicose veins of bilateral lower extremities: Secondary | ICD-10-CM | POA: Diagnosis not present

## 2019-12-17 DIAGNOSIS — J439 Emphysema, unspecified: Secondary | ICD-10-CM | POA: Diagnosis not present

## 2019-12-17 DIAGNOSIS — I4891 Unspecified atrial fibrillation: Secondary | ICD-10-CM | POA: Diagnosis not present

## 2019-12-17 DIAGNOSIS — N183 Chronic kidney disease, stage 3 unspecified: Secondary | ICD-10-CM | POA: Diagnosis not present

## 2019-12-17 DIAGNOSIS — F0281 Dementia in other diseases classified elsewhere with behavioral disturbance: Secondary | ICD-10-CM | POA: Diagnosis not present

## 2019-12-17 DIAGNOSIS — I129 Hypertensive chronic kidney disease with stage 1 through stage 4 chronic kidney disease, or unspecified chronic kidney disease: Secondary | ICD-10-CM | POA: Diagnosis not present

## 2019-12-17 DIAGNOSIS — G301 Alzheimer's disease with late onset: Secondary | ICD-10-CM | POA: Diagnosis not present

## 2019-12-17 DIAGNOSIS — S80821D Blister (nonthermal), right lower leg, subsequent encounter: Secondary | ICD-10-CM | POA: Diagnosis not present

## 2019-12-17 DIAGNOSIS — S80822D Blister (nonthermal), left lower leg, subsequent encounter: Secondary | ICD-10-CM | POA: Diagnosis not present

## 2019-12-20 DIAGNOSIS — I129 Hypertensive chronic kidney disease with stage 1 through stage 4 chronic kidney disease, or unspecified chronic kidney disease: Secondary | ICD-10-CM | POA: Diagnosis not present

## 2019-12-20 DIAGNOSIS — F0281 Dementia in other diseases classified elsewhere with behavioral disturbance: Secondary | ICD-10-CM | POA: Diagnosis not present

## 2019-12-20 DIAGNOSIS — I8393 Asymptomatic varicose veins of bilateral lower extremities: Secondary | ICD-10-CM | POA: Diagnosis not present

## 2019-12-20 DIAGNOSIS — I4891 Unspecified atrial fibrillation: Secondary | ICD-10-CM | POA: Diagnosis not present

## 2019-12-20 DIAGNOSIS — G301 Alzheimer's disease with late onset: Secondary | ICD-10-CM | POA: Diagnosis not present

## 2019-12-20 DIAGNOSIS — J439 Emphysema, unspecified: Secondary | ICD-10-CM | POA: Diagnosis not present

## 2019-12-20 DIAGNOSIS — S80821D Blister (nonthermal), right lower leg, subsequent encounter: Secondary | ICD-10-CM | POA: Diagnosis not present

## 2019-12-20 DIAGNOSIS — N183 Chronic kidney disease, stage 3 unspecified: Secondary | ICD-10-CM | POA: Diagnosis not present

## 2019-12-20 DIAGNOSIS — S80822D Blister (nonthermal), left lower leg, subsequent encounter: Secondary | ICD-10-CM | POA: Diagnosis not present

## 2019-12-23 DIAGNOSIS — F0280 Dementia in other diseases classified elsewhere without behavioral disturbance: Secondary | ICD-10-CM | POA: Diagnosis not present

## 2019-12-23 DIAGNOSIS — J439 Emphysema, unspecified: Secondary | ICD-10-CM | POA: Diagnosis not present

## 2019-12-23 DIAGNOSIS — I10 Essential (primary) hypertension: Secondary | ICD-10-CM | POA: Diagnosis not present

## 2019-12-23 DIAGNOSIS — I4819 Other persistent atrial fibrillation: Secondary | ICD-10-CM | POA: Diagnosis not present

## 2019-12-23 DIAGNOSIS — E7800 Pure hypercholesterolemia, unspecified: Secondary | ICD-10-CM | POA: Diagnosis not present

## 2019-12-23 DIAGNOSIS — G301 Alzheimer's disease with late onset: Secondary | ICD-10-CM | POA: Diagnosis not present

## 2019-12-24 DIAGNOSIS — I8393 Asymptomatic varicose veins of bilateral lower extremities: Secondary | ICD-10-CM | POA: Diagnosis not present

## 2019-12-24 DIAGNOSIS — F0281 Dementia in other diseases classified elsewhere with behavioral disturbance: Secondary | ICD-10-CM | POA: Diagnosis not present

## 2019-12-24 DIAGNOSIS — I4891 Unspecified atrial fibrillation: Secondary | ICD-10-CM | POA: Diagnosis not present

## 2019-12-24 DIAGNOSIS — J439 Emphysema, unspecified: Secondary | ICD-10-CM | POA: Diagnosis not present

## 2019-12-24 DIAGNOSIS — G301 Alzheimer's disease with late onset: Secondary | ICD-10-CM | POA: Diagnosis not present

## 2019-12-24 DIAGNOSIS — N183 Chronic kidney disease, stage 3 unspecified: Secondary | ICD-10-CM | POA: Diagnosis not present

## 2019-12-24 DIAGNOSIS — S80821D Blister (nonthermal), right lower leg, subsequent encounter: Secondary | ICD-10-CM | POA: Diagnosis not present

## 2019-12-24 DIAGNOSIS — I129 Hypertensive chronic kidney disease with stage 1 through stage 4 chronic kidney disease, or unspecified chronic kidney disease: Secondary | ICD-10-CM | POA: Diagnosis not present

## 2019-12-24 DIAGNOSIS — S80822D Blister (nonthermal), left lower leg, subsequent encounter: Secondary | ICD-10-CM | POA: Diagnosis not present

## 2019-12-27 DIAGNOSIS — I4891 Unspecified atrial fibrillation: Secondary | ICD-10-CM | POA: Diagnosis not present

## 2019-12-27 DIAGNOSIS — J439 Emphysema, unspecified: Secondary | ICD-10-CM | POA: Diagnosis not present

## 2019-12-27 DIAGNOSIS — I129 Hypertensive chronic kidney disease with stage 1 through stage 4 chronic kidney disease, or unspecified chronic kidney disease: Secondary | ICD-10-CM | POA: Diagnosis not present

## 2019-12-27 DIAGNOSIS — I8393 Asymptomatic varicose veins of bilateral lower extremities: Secondary | ICD-10-CM | POA: Diagnosis not present

## 2019-12-27 DIAGNOSIS — N183 Chronic kidney disease, stage 3 unspecified: Secondary | ICD-10-CM | POA: Diagnosis not present

## 2019-12-27 DIAGNOSIS — G301 Alzheimer's disease with late onset: Secondary | ICD-10-CM | POA: Diagnosis not present

## 2019-12-27 DIAGNOSIS — F0281 Dementia in other diseases classified elsewhere with behavioral disturbance: Secondary | ICD-10-CM | POA: Diagnosis not present

## 2019-12-27 DIAGNOSIS — S80822D Blister (nonthermal), left lower leg, subsequent encounter: Secondary | ICD-10-CM | POA: Diagnosis not present

## 2019-12-27 DIAGNOSIS — S80821D Blister (nonthermal), right lower leg, subsequent encounter: Secondary | ICD-10-CM | POA: Diagnosis not present

## 2019-12-31 DIAGNOSIS — G301 Alzheimer's disease with late onset: Secondary | ICD-10-CM | POA: Diagnosis not present

## 2019-12-31 DIAGNOSIS — I4891 Unspecified atrial fibrillation: Secondary | ICD-10-CM | POA: Diagnosis not present

## 2019-12-31 DIAGNOSIS — J439 Emphysema, unspecified: Secondary | ICD-10-CM | POA: Diagnosis not present

## 2019-12-31 DIAGNOSIS — I129 Hypertensive chronic kidney disease with stage 1 through stage 4 chronic kidney disease, or unspecified chronic kidney disease: Secondary | ICD-10-CM | POA: Diagnosis not present

## 2019-12-31 DIAGNOSIS — S80821D Blister (nonthermal), right lower leg, subsequent encounter: Secondary | ICD-10-CM | POA: Diagnosis not present

## 2019-12-31 DIAGNOSIS — I8393 Asymptomatic varicose veins of bilateral lower extremities: Secondary | ICD-10-CM | POA: Diagnosis not present

## 2019-12-31 DIAGNOSIS — S80822D Blister (nonthermal), left lower leg, subsequent encounter: Secondary | ICD-10-CM | POA: Diagnosis not present

## 2019-12-31 DIAGNOSIS — N183 Chronic kidney disease, stage 3 unspecified: Secondary | ICD-10-CM | POA: Diagnosis not present

## 2019-12-31 DIAGNOSIS — F0281 Dementia in other diseases classified elsewhere with behavioral disturbance: Secondary | ICD-10-CM | POA: Diagnosis not present

## 2020-01-03 DIAGNOSIS — J439 Emphysema, unspecified: Secondary | ICD-10-CM | POA: Diagnosis not present

## 2020-01-03 DIAGNOSIS — I8393 Asymptomatic varicose veins of bilateral lower extremities: Secondary | ICD-10-CM | POA: Diagnosis not present

## 2020-01-03 DIAGNOSIS — S80822D Blister (nonthermal), left lower leg, subsequent encounter: Secondary | ICD-10-CM | POA: Diagnosis not present

## 2020-01-03 DIAGNOSIS — I129 Hypertensive chronic kidney disease with stage 1 through stage 4 chronic kidney disease, or unspecified chronic kidney disease: Secondary | ICD-10-CM | POA: Diagnosis not present

## 2020-01-03 DIAGNOSIS — I4891 Unspecified atrial fibrillation: Secondary | ICD-10-CM | POA: Diagnosis not present

## 2020-01-03 DIAGNOSIS — S80821D Blister (nonthermal), right lower leg, subsequent encounter: Secondary | ICD-10-CM | POA: Diagnosis not present

## 2020-01-03 DIAGNOSIS — F0281 Dementia in other diseases classified elsewhere with behavioral disturbance: Secondary | ICD-10-CM | POA: Diagnosis not present

## 2020-01-03 DIAGNOSIS — N183 Chronic kidney disease, stage 3 unspecified: Secondary | ICD-10-CM | POA: Diagnosis not present

## 2020-01-03 DIAGNOSIS — G301 Alzheimer's disease with late onset: Secondary | ICD-10-CM | POA: Diagnosis not present

## 2020-01-08 DIAGNOSIS — S80821D Blister (nonthermal), right lower leg, subsequent encounter: Secondary | ICD-10-CM | POA: Diagnosis not present

## 2020-01-08 DIAGNOSIS — J439 Emphysema, unspecified: Secondary | ICD-10-CM | POA: Diagnosis not present

## 2020-01-08 DIAGNOSIS — N183 Chronic kidney disease, stage 3 unspecified: Secondary | ICD-10-CM | POA: Diagnosis not present

## 2020-01-08 DIAGNOSIS — I4891 Unspecified atrial fibrillation: Secondary | ICD-10-CM | POA: Diagnosis not present

## 2020-01-08 DIAGNOSIS — F0281 Dementia in other diseases classified elsewhere with behavioral disturbance: Secondary | ICD-10-CM | POA: Diagnosis not present

## 2020-01-08 DIAGNOSIS — S80822D Blister (nonthermal), left lower leg, subsequent encounter: Secondary | ICD-10-CM | POA: Diagnosis not present

## 2020-01-08 DIAGNOSIS — I129 Hypertensive chronic kidney disease with stage 1 through stage 4 chronic kidney disease, or unspecified chronic kidney disease: Secondary | ICD-10-CM | POA: Diagnosis not present

## 2020-01-08 DIAGNOSIS — I8393 Asymptomatic varicose veins of bilateral lower extremities: Secondary | ICD-10-CM | POA: Diagnosis not present

## 2020-01-08 DIAGNOSIS — G301 Alzheimer's disease with late onset: Secondary | ICD-10-CM | POA: Diagnosis not present

## 2020-01-10 DIAGNOSIS — I4891 Unspecified atrial fibrillation: Secondary | ICD-10-CM | POA: Diagnosis not present

## 2020-01-10 DIAGNOSIS — G301 Alzheimer's disease with late onset: Secondary | ICD-10-CM | POA: Diagnosis not present

## 2020-01-10 DIAGNOSIS — J439 Emphysema, unspecified: Secondary | ICD-10-CM | POA: Diagnosis not present

## 2020-01-10 DIAGNOSIS — N183 Chronic kidney disease, stage 3 unspecified: Secondary | ICD-10-CM | POA: Diagnosis not present

## 2020-01-10 DIAGNOSIS — I8393 Asymptomatic varicose veins of bilateral lower extremities: Secondary | ICD-10-CM | POA: Diagnosis not present

## 2020-01-10 DIAGNOSIS — S80822D Blister (nonthermal), left lower leg, subsequent encounter: Secondary | ICD-10-CM | POA: Diagnosis not present

## 2020-01-10 DIAGNOSIS — S80821D Blister (nonthermal), right lower leg, subsequent encounter: Secondary | ICD-10-CM | POA: Diagnosis not present

## 2020-01-10 DIAGNOSIS — F0281 Dementia in other diseases classified elsewhere with behavioral disturbance: Secondary | ICD-10-CM | POA: Diagnosis not present

## 2020-01-10 DIAGNOSIS — I129 Hypertensive chronic kidney disease with stage 1 through stage 4 chronic kidney disease, or unspecified chronic kidney disease: Secondary | ICD-10-CM | POA: Diagnosis not present

## 2020-01-13 DIAGNOSIS — F0280 Dementia in other diseases classified elsewhere without behavioral disturbance: Secondary | ICD-10-CM | POA: Diagnosis not present

## 2020-01-13 DIAGNOSIS — G301 Alzheimer's disease with late onset: Secondary | ICD-10-CM | POA: Diagnosis not present

## 2020-01-13 DIAGNOSIS — I10 Essential (primary) hypertension: Secondary | ICD-10-CM | POA: Diagnosis not present

## 2020-01-13 DIAGNOSIS — E7800 Pure hypercholesterolemia, unspecified: Secondary | ICD-10-CM | POA: Diagnosis not present

## 2020-01-13 DIAGNOSIS — I4819 Other persistent atrial fibrillation: Secondary | ICD-10-CM | POA: Diagnosis not present

## 2020-01-13 DIAGNOSIS — J439 Emphysema, unspecified: Secondary | ICD-10-CM | POA: Diagnosis not present

## 2020-01-14 DIAGNOSIS — I4891 Unspecified atrial fibrillation: Secondary | ICD-10-CM | POA: Diagnosis not present

## 2020-01-14 DIAGNOSIS — I8393 Asymptomatic varicose veins of bilateral lower extremities: Secondary | ICD-10-CM | POA: Diagnosis not present

## 2020-01-14 DIAGNOSIS — S80821D Blister (nonthermal), right lower leg, subsequent encounter: Secondary | ICD-10-CM | POA: Diagnosis not present

## 2020-01-14 DIAGNOSIS — G301 Alzheimer's disease with late onset: Secondary | ICD-10-CM | POA: Diagnosis not present

## 2020-01-14 DIAGNOSIS — F0281 Dementia in other diseases classified elsewhere with behavioral disturbance: Secondary | ICD-10-CM | POA: Diagnosis not present

## 2020-01-14 DIAGNOSIS — J439 Emphysema, unspecified: Secondary | ICD-10-CM | POA: Diagnosis not present

## 2020-01-14 DIAGNOSIS — I129 Hypertensive chronic kidney disease with stage 1 through stage 4 chronic kidney disease, or unspecified chronic kidney disease: Secondary | ICD-10-CM | POA: Diagnosis not present

## 2020-01-14 DIAGNOSIS — N183 Chronic kidney disease, stage 3 unspecified: Secondary | ICD-10-CM | POA: Diagnosis not present

## 2020-01-14 DIAGNOSIS — S80822D Blister (nonthermal), left lower leg, subsequent encounter: Secondary | ICD-10-CM | POA: Diagnosis not present

## 2020-01-17 DIAGNOSIS — N183 Chronic kidney disease, stage 3 unspecified: Secondary | ICD-10-CM | POA: Diagnosis not present

## 2020-01-17 DIAGNOSIS — I8393 Asymptomatic varicose veins of bilateral lower extremities: Secondary | ICD-10-CM | POA: Diagnosis not present

## 2020-01-17 DIAGNOSIS — I4891 Unspecified atrial fibrillation: Secondary | ICD-10-CM | POA: Diagnosis not present

## 2020-01-17 DIAGNOSIS — J439 Emphysema, unspecified: Secondary | ICD-10-CM | POA: Diagnosis not present

## 2020-01-17 DIAGNOSIS — I129 Hypertensive chronic kidney disease with stage 1 through stage 4 chronic kidney disease, or unspecified chronic kidney disease: Secondary | ICD-10-CM | POA: Diagnosis not present

## 2020-01-17 DIAGNOSIS — G301 Alzheimer's disease with late onset: Secondary | ICD-10-CM | POA: Diagnosis not present

## 2020-01-17 DIAGNOSIS — S80822D Blister (nonthermal), left lower leg, subsequent encounter: Secondary | ICD-10-CM | POA: Diagnosis not present

## 2020-01-17 DIAGNOSIS — F0281 Dementia in other diseases classified elsewhere with behavioral disturbance: Secondary | ICD-10-CM | POA: Diagnosis not present

## 2020-01-17 DIAGNOSIS — S80821D Blister (nonthermal), right lower leg, subsequent encounter: Secondary | ICD-10-CM | POA: Diagnosis not present

## 2020-01-20 ENCOUNTER — Other Ambulatory Visit: Payer: Self-pay

## 2020-01-20 ENCOUNTER — Other Ambulatory Visit: Payer: Medicare HMO | Admitting: Hospice

## 2020-01-20 DIAGNOSIS — S80822D Blister (nonthermal), left lower leg, subsequent encounter: Secondary | ICD-10-CM | POA: Diagnosis not present

## 2020-01-20 DIAGNOSIS — I129 Hypertensive chronic kidney disease with stage 1 through stage 4 chronic kidney disease, or unspecified chronic kidney disease: Secondary | ICD-10-CM | POA: Diagnosis not present

## 2020-01-20 DIAGNOSIS — I8393 Asymptomatic varicose veins of bilateral lower extremities: Secondary | ICD-10-CM | POA: Diagnosis not present

## 2020-01-20 DIAGNOSIS — I4891 Unspecified atrial fibrillation: Secondary | ICD-10-CM | POA: Diagnosis not present

## 2020-01-20 DIAGNOSIS — J439 Emphysema, unspecified: Secondary | ICD-10-CM | POA: Diagnosis not present

## 2020-01-20 DIAGNOSIS — G301 Alzheimer's disease with late onset: Secondary | ICD-10-CM | POA: Diagnosis not present

## 2020-01-20 DIAGNOSIS — Z515 Encounter for palliative care: Secondary | ICD-10-CM

## 2020-01-20 DIAGNOSIS — S80821D Blister (nonthermal), right lower leg, subsequent encounter: Secondary | ICD-10-CM | POA: Diagnosis not present

## 2020-01-20 DIAGNOSIS — F0390 Unspecified dementia without behavioral disturbance: Secondary | ICD-10-CM

## 2020-01-20 DIAGNOSIS — N183 Chronic kidney disease, stage 3 unspecified: Secondary | ICD-10-CM | POA: Diagnosis not present

## 2020-01-20 DIAGNOSIS — F0281 Dementia in other diseases classified elsewhere with behavioral disturbance: Secondary | ICD-10-CM | POA: Diagnosis not present

## 2020-01-20 NOTE — Progress Notes (Signed)
[facility] Consult Note Telephone: [phone]  Fax: [phone]  PATIENT NAME: Donald Rasmussen DOB: Aug 27, 1932 MRN: [id number]  PRIMARY CARE PROVIDER:   Lajean Manes, MD Donald Rasmussen, Donald Rasmussen  REFERRING PROVIDER: Lajean Manes, MD Donald Rasmussen, Donald Rasmussen  RESPONSIBLE PARTY:Donald Rasmussen [phone] - grand son;Donald Rasmussen [phone] - best number to call. Donald Rasmussen - grandson (23 son) [phone] Phone at home: [place] Due to the COVID-19 crisis, this visit was done via telephone from my office. It was initiated and consented to by this patient and/or family.   RECOMMENDATIONS/PLAN:  Advance Care Planning/Goals of Care: Visit consisted of building trust and further  discussions on Palliative Medicine as specialized medical care for people living with serious illness, aimed at facilitating better quality of life through symptoms relief, assisting with advance care plan and establishing goals of care.    CODE STATUS: Patient is a DO NOT RESUSCITATE.  Signed DNR form at home; same document uploaded to epic.  Goals of care: Family is interested in Hospice services/comfort care when patient qualifies for hospice service.Goals of care include to maximize quality of life and symptom management.  Follow Donald Rasmussen care will continue to follow patient for goals of care clarification and symptom management.  Next visit scheduled in 3 months.  Symptom management:  Patient denies pain/discomfort no hospitalization, no medical acuity, fairly stable in his chronic health conditions. Chronic edema to bilateral lower extremities improving since [facility] staff [facility] weekly BLE, per report from running. Elevation of bilateral lower extremities and adherence to taking  furosemide as ordered reiterated.   Patient continues on long-term anticoagulation - Eliquis for A. fib; no report of bleeding or palpitations or chest pain or syncope. Ongoing memory loss and confusion related to dementiaFAST 6c, occasional urinary incontinence.  Patient's appetite is good. Patient sleeps well at night none needing daily melatonin 5mg  at bedtime at this time.  Encouraged ongoing care.  Palliative will continue to monitor for symptom management/decline and make recommendations as needed.  Community/caregiver/family support: [facility] comes weekly to wrap patient's bilateral lower extremities.  Strongfamilysupport systemin placefor patient:Donald, Donald Rasmussen taking turns to visit patient dailyto provide careand make sure he takes his medications as ordered.  I spentone 48providing this consultation; time includes time with patient/family, chart review and documentation. More than 50% of the time in this consultation was spent on coordinating communication  HISTORY OF PRESENT ILLNESS:Donald T McGeeis a 86 y.o.year oldmalewith multiple medical problems including Afib CAD HTNDementia.Patient in full remission for prostate CA.Palliative Care was asked to help address goals of care.   CODE STATUS:DNR   PPS:50%  HOSPICE ELIGIBILITY/DIAGNOSIS: TBD  PAST MEDICAL HISTORY:  Past Medical History:  Diagnosis Date  . Atrial fibrillation ([place]) 03/07/2012   Unknown duration but associated with CHF   . CAD (coronary artery disease), native coronary artery 03/07/2012   Prior MI with BMS RCA   . Cancer [facility])    prostate; Dr. Amalia Rasmussen  . Chronic kidney disease    Stg III kidney disease; GRF 55  . Chronic low back pain    2000 lumbosacral spine degenerative change. Possible calculus  . Complication of anesthesia   . Coronary artery disease    BMS 1999  . Edentulous   .  Essential hypertension 03/07/2012  . Gout   .  History of amiodarone therapy 08/12/2013  . Hyperlipidemia   . Hypertension   . Impaired fasting blood sugar   . Long term current use of anticoagulant therapy 08/12/2013  . Mucous cyst of tonsil     SOCIAL HX:  Social History   Tobacco Use  . Smoking status: Former Research scientist (life sciences)  . Smokeless tobacco: Current User    Types: Chew  Substance Use Topics  . Alcohol use: Yes    Alcohol/week: 0.0 standard drinks    ALLERGIES:  Allergies  Allergen Reactions  . Bee Venom Swelling     PERTINENT MEDICATIONS:  Outpatient Encounter Medications as of 01/20/2020  Medication Sig  . acetaminophen (TYLENOL) 500 MG tablet Take 500 mg by mouth every 6 (six) hours as needed (pain).   Marland Kitchen allopurinol (ZYLOPRIM) 300 MG tablet Take 300 mg by mouth daily.  Marland Kitchen atorvastatin (LIPITOR) 20 MG tablet Take 1 tablet (20 mg total) by mouth daily.  . cholecalciferol (VITAMIN D) 1000 UNITS tablet Take 2,000 Units by mouth daily.  Marland Kitchen ELIQUIS 5 MG TABS tablet TAKE ONE TABLET BY MOUTH AT BREAKFAST AND AT BEDTIME  . EPINEPHrine (EPIPEN JR) 0.15 MG/0.3ML injection Inject 0.15 mg into the muscle as needed for anaphylaxis.   . fish oil-omega-3 fatty acids 1000 MG capsule Take 1,200 mg by mouth daily.  . furosemide (LASIX) 20 MG tablet Take 1 tablet (20 mg total) by mouth daily. Take 2 pills for 3 days and then back to one pill daily  . Melatonin 5 MG TABS Take 1 tablet by mouth at bedtime.  . metoprolol tartrate (LOPRESSOR) 25 MG tablet Take 0.5 tablets (12.5 mg total) by mouth 2 (two) times daily.  . nitroGLYCERIN (NITROSTAT) 0.4 MG SL tablet Place 1 tablet (0.4 mg total) under the tongue every 5 (five) minutes as needed for chest pain.  Marland Kitchen quinapril (ACCUPRIL) 20 MG tablet TAKE ONE TABLET BY MOUTH EVERY MORNING  . spironolactone (ALDACTONE) 25 MG tablet Take 0.5 tablets (12.5 mg total) by mouth every Monday, Wednesday, and Friday.   No facility-administered encounter medications on file as of 01/20/2020.    Teodoro Spray,  NP

## 2020-01-23 DIAGNOSIS — S80822D Blister (nonthermal), left lower leg, subsequent encounter: Secondary | ICD-10-CM | POA: Diagnosis not present

## 2020-01-23 DIAGNOSIS — G301 Alzheimer's disease with late onset: Secondary | ICD-10-CM | POA: Diagnosis not present

## 2020-01-23 DIAGNOSIS — J439 Emphysema, unspecified: Secondary | ICD-10-CM | POA: Diagnosis not present

## 2020-01-23 DIAGNOSIS — N183 Chronic kidney disease, stage 3 unspecified: Secondary | ICD-10-CM | POA: Diagnosis not present

## 2020-01-23 DIAGNOSIS — I4891 Unspecified atrial fibrillation: Secondary | ICD-10-CM | POA: Diagnosis not present

## 2020-01-23 DIAGNOSIS — F0281 Dementia in other diseases classified elsewhere with behavioral disturbance: Secondary | ICD-10-CM | POA: Diagnosis not present

## 2020-01-23 DIAGNOSIS — I129 Hypertensive chronic kidney disease with stage 1 through stage 4 chronic kidney disease, or unspecified chronic kidney disease: Secondary | ICD-10-CM | POA: Diagnosis not present

## 2020-01-23 DIAGNOSIS — S80821D Blister (nonthermal), right lower leg, subsequent encounter: Secondary | ICD-10-CM | POA: Diagnosis not present

## 2020-01-23 DIAGNOSIS — I8393 Asymptomatic varicose veins of bilateral lower extremities: Secondary | ICD-10-CM | POA: Diagnosis not present

## 2020-01-24 DIAGNOSIS — I8393 Asymptomatic varicose veins of bilateral lower extremities: Secondary | ICD-10-CM | POA: Diagnosis not present

## 2020-01-24 DIAGNOSIS — I4891 Unspecified atrial fibrillation: Secondary | ICD-10-CM | POA: Diagnosis not present

## 2020-01-24 DIAGNOSIS — F0281 Dementia in other diseases classified elsewhere with behavioral disturbance: Secondary | ICD-10-CM | POA: Diagnosis not present

## 2020-01-24 DIAGNOSIS — G301 Alzheimer's disease with late onset: Secondary | ICD-10-CM | POA: Diagnosis not present

## 2020-01-24 DIAGNOSIS — S80821D Blister (nonthermal), right lower leg, subsequent encounter: Secondary | ICD-10-CM | POA: Diagnosis not present

## 2020-01-24 DIAGNOSIS — J439 Emphysema, unspecified: Secondary | ICD-10-CM | POA: Diagnosis not present

## 2020-01-24 DIAGNOSIS — I129 Hypertensive chronic kidney disease with stage 1 through stage 4 chronic kidney disease, or unspecified chronic kidney disease: Secondary | ICD-10-CM | POA: Diagnosis not present

## 2020-01-24 DIAGNOSIS — N183 Chronic kidney disease, stage 3 unspecified: Secondary | ICD-10-CM | POA: Diagnosis not present

## 2020-01-24 DIAGNOSIS — S80822D Blister (nonthermal), left lower leg, subsequent encounter: Secondary | ICD-10-CM | POA: Diagnosis not present

## 2020-01-28 DIAGNOSIS — S80821D Blister (nonthermal), right lower leg, subsequent encounter: Secondary | ICD-10-CM | POA: Diagnosis not present

## 2020-01-28 DIAGNOSIS — S80822D Blister (nonthermal), left lower leg, subsequent encounter: Secondary | ICD-10-CM | POA: Diagnosis not present

## 2020-01-28 DIAGNOSIS — J439 Emphysema, unspecified: Secondary | ICD-10-CM | POA: Diagnosis not present

## 2020-01-28 DIAGNOSIS — I4891 Unspecified atrial fibrillation: Secondary | ICD-10-CM | POA: Diagnosis not present

## 2020-01-28 DIAGNOSIS — G301 Alzheimer's disease with late onset: Secondary | ICD-10-CM | POA: Diagnosis not present

## 2020-01-28 DIAGNOSIS — I8393 Asymptomatic varicose veins of bilateral lower extremities: Secondary | ICD-10-CM | POA: Diagnosis not present

## 2020-01-28 DIAGNOSIS — I129 Hypertensive chronic kidney disease with stage 1 through stage 4 chronic kidney disease, or unspecified chronic kidney disease: Secondary | ICD-10-CM | POA: Diagnosis not present

## 2020-01-28 DIAGNOSIS — N183 Chronic kidney disease, stage 3 unspecified: Secondary | ICD-10-CM | POA: Diagnosis not present

## 2020-01-28 DIAGNOSIS — F0281 Dementia in other diseases classified elsewhere with behavioral disturbance: Secondary | ICD-10-CM | POA: Diagnosis not present

## 2020-01-31 DIAGNOSIS — G301 Alzheimer's disease with late onset: Secondary | ICD-10-CM | POA: Diagnosis not present

## 2020-01-31 DIAGNOSIS — N183 Chronic kidney disease, stage 3 unspecified: Secondary | ICD-10-CM | POA: Diagnosis not present

## 2020-01-31 DIAGNOSIS — I129 Hypertensive chronic kidney disease with stage 1 through stage 4 chronic kidney disease, or unspecified chronic kidney disease: Secondary | ICD-10-CM | POA: Diagnosis not present

## 2020-01-31 DIAGNOSIS — S80822D Blister (nonthermal), left lower leg, subsequent encounter: Secondary | ICD-10-CM | POA: Diagnosis not present

## 2020-01-31 DIAGNOSIS — S80821D Blister (nonthermal), right lower leg, subsequent encounter: Secondary | ICD-10-CM | POA: Diagnosis not present

## 2020-01-31 DIAGNOSIS — I8393 Asymptomatic varicose veins of bilateral lower extremities: Secondary | ICD-10-CM | POA: Diagnosis not present

## 2020-01-31 DIAGNOSIS — I4891 Unspecified atrial fibrillation: Secondary | ICD-10-CM | POA: Diagnosis not present

## 2020-01-31 DIAGNOSIS — F0281 Dementia in other diseases classified elsewhere with behavioral disturbance: Secondary | ICD-10-CM | POA: Diagnosis not present

## 2020-01-31 DIAGNOSIS — J439 Emphysema, unspecified: Secondary | ICD-10-CM | POA: Diagnosis not present

## 2020-02-02 NOTE — Progress Notes (Signed)
CARDIOLOGY OFFICE NOTE  Date:  02/11/2020    Donald Rasmussen Date of Birth: 1932/12/16 Medical Record #[id number]  PCP:  Lajean Manes, MD  Cardiologist:  Jennings Books  Chief Complaint  Patient presents with  . Follow-up    Seen for [name]    History of Present Illness: Donald Rasmussen is a 86 y.o. male who presents today for a follow up visit. Seen for [name].   He has a history of persistent AF, CAD with prior RCA stent (BMS in 1999), HTN, on chronic anticoagulation and CKD stage III.   Last seen by [name] back in January 2021-was "blissfullyconfused".He had been having issues with swelling. No real improvement with Lasix.I did a telehealth visit with them in March - swelling remained the biggest issue. Not able to use compression stockings. Legs will weep at times. Gave round of antibiotics for what sounded like cellulitis. Family had opted for conservative measures.I have seen him back several times - we have put him on Palliative Care. I stopped his Norvasc - was on high dose.   Last seen by me in June - seemed to be holding his own.   Comes in today. Here with family - "[name]" - she provides the history. She notes he is sleeping more. He has some numbness in his hands/fingers. He has had some belly pain - apparently has several hernias - no plans to fix. He is back sleeping in the bed.  Has had Remeron added to his regimen. BP has fluctuated - probably related to salt use. It is good here today.  His back still hurts. He still "sees things". Sees people as well - some that are deceased and others that no one knows who they are. Swelling has improved. His prior ulcers are almost healed. He is in unna boots. He does have a nurse that comes to see him. Palliative care was there last month. Several "almost" falls. He denies chest pain. Says his breathing is ok. Remains on Eliquis.   Past Medical History:  Diagnosis Date  . Atrial fibrillation ([place]) 03/07/2012    Unknown duration but associated with CHF   . CAD (coronary artery disease), native coronary artery 03/07/2012   Prior MI with BMS RCA   . Cancer [facility])    prostate; Dr. Amalia Hailey  . Chronic kidney disease    Stg III kidney disease; GRF 55  . Chronic low back pain    2000 lumbosacral spine degenerative change. Possible calculus  . Complication of anesthesia   . Coronary artery disease    BMS 1999  . Edentulous   . Essential hypertension 03/07/2012  . Gout   . History of amiodarone therapy 08/12/2013  . Hyperlipidemia   . Hypertension   . Impaired fasting blood sugar   . Long term current use of anticoagulant therapy 08/12/2013  . Mucous cyst of tonsil     Past Surgical History:  Procedure Laterality Date  . CARDIAC CATHETERIZATION     stent   . CARDIOVERSION  03/07/2012   Procedure: CARDIOVERSION;  Surgeon: Sinclair Grooms, MD;  Location: [place];  Service: Cardiovascular;  Laterality: N/A;  . HEMORRHOID SURGERY    . RETROPUBIC PROSTATECTOMY  1997  . sebaceous cyst back       Medications: Current Meds  Medication Sig  . acetaminophen (TYLENOL) 500 MG tablet Take 500 mg by mouth every 6 (six) hours as needed (pain).   Marland Kitchen allopurinol (ZYLOPRIM) 300  MG tablet Take 300 mg by mouth daily.  Marland Kitchen atorvastatin (LIPITOR) 20 MG tablet Take 1 tablet (20 mg total) by mouth daily.  . cholecalciferol (VITAMIN D) 1000 UNITS tablet Take 2,000 Units by mouth daily.  Marland Kitchen ELIQUIS 5 MG TABS tablet TAKE ONE TABLET BY MOUTH AT BREAKFAST AND AT BEDTIME  . EPINEPHrine (EPIPEN JR) 0.15 MG/0.3ML injection Inject 0.15 mg into the muscle as needed for anaphylaxis.   . fish oil-omega-3 fatty acids 1000 MG capsule Take 1,200 mg by mouth daily.  . furosemide (LASIX) 20 MG tablet Take 1 tablet (20 mg total) by mouth daily. Take 2 pills for 3 days and then back to one pill daily  . Melatonin 5 MG TABS Take 1 tablet by mouth at bedtime.  . metoprolol tartrate (LOPRESSOR) 25 MG tablet Take 0.5 tablets (12.5 mg  total) by mouth 2 (two) times daily.  . mirtazapine (REMERON) 15 MG tablet   . nitroGLYCERIN (NITROSTAT) 0.4 MG SL tablet Place 1 tablet (0.4 mg total) under the tongue every 5 (five) minutes as needed for chest pain.  Marland Kitchen quinapril (ACCUPRIL) 20 MG tablet TAKE ONE TABLET BY MOUTH EVERY MORNING  . spironolactone (ALDACTONE) 25 MG tablet Take 0.5 tablets (12.5 mg total) by mouth every Monday, Wednesday, and Friday.     Allergies: Allergies  Allergen Reactions  . Bee Venom Swelling    Social History: The patient  reports that he has quit smoking. His smokeless tobacco use includes chew. He reports current alcohol use. He reports that he does not use drugs.   Family History: The patient's family history includes Heart attack in his father and mother; Hyperlipidemia in his mother.   Review of Systems: Please see the history of present illness.   All other systems are reviewed and negative.   Physical Exam: VS:  BP 120/76   Pulse 87   Ht 6\' 4"  (1.93 m)   Wt 198 lb 9.6 oz (90.1 kg)   SpO2 96%   BMI 24.17 kg/m  .  BMI Body mass index is 24.17 kg/m.  Wt Readings from Last 3 Encounters:  02/11/20 198 lb 9.6 oz (90.1 kg)  10/28/19 206 lb 9.6 oz (93.7 kg)  09/24/19 208 lb 12.8 oz (94.7 kg)    General: Pleasant. He is pleasantly confused but alert and in no acute distress. He has his chewing tobacco in his shirt pocket.   Cardiac: Irregular irregular rhythm. His rate is fine.  No significant edema. His legs are wrapped.  Respiratory:  Lungs are clear to auscultation bilaterally with normal work of breathing.  GI: Soft and nontender.  MS: No deformity or atrophy. Gait and ROM intact.  Skin: Warm and dry. Color is normal.  Neuro:  Strength and sensation are intact and no gross focal deficits noted.  Psych: Alert, appropriate and with normal affect.   LABORATORY DATA:  EKG:  EKG is not ordered today.   Lab Results  Component Value Date   WBC 7.3 09/24/2019   HGB 13.8 09/24/2019    HCT 41.1 09/24/2019   PLT 218 09/24/2019   GLUCOSE 98 09/24/2019   ALT 20 02/09/2018   AST 23 02/09/2018   NA 148 (H) 09/24/2019   K 3.9 09/24/2019   CL 104 09/24/2019   CREATININE 1.17 09/24/2019   BUN 11 09/24/2019   CO2 29 09/24/2019   TSH 0.03 (L) 11/14/2014   INR 1.9 (A) 08/14/2018       BNP (last 3 results) No results for  input(s): BNP in the last 8760 hours.  ProBNP (last 3 results) Recent Labs    05/09/19 1149  PROBNP 1,174*     Other Studies Reviewed Today:  ECHOIMPRESSIONS1/2021 1. LV hypertrophy, with LV muscle appearance consistent with possible  amyloid. While diastology cannot be fully evaluated due to afib, enlarged  LA and low tissue doppler velocities consistent with possible infiltrative  process. Recommend PYP scan if  clinical concern for amyloid exists.  2. Prominent IAS bowing to right with PFO with left to right flow. Bubble  study negative for right to left shunting.  3. Left ventricular ejection fraction, by visual estimation, is 60 to  65%. The left ventricle has normal function. There is moderately increased  left ventricular hypertrophy.  4. Left ventricular diastolic function could not be evaluated.  5. The left ventricle has no regional wall motion abnormalities.  6. Global right ventricle has normal systolic function.The right  ventricular size is mildly enlarged. No increase in right ventricular wall  thickness.  7. Left atrial size was severely dilated.  8. Right atrial size was severely dilated.  9. Trivial pericardial effusion is present.  10. Moderate mitral annular calcification.  11. The mitral valve is degenerative. Mild mitral valve regurgitation.  12. The tricuspid valve is normal in structure.  13. The tricuspid valve is normal in structure. Tricuspid valve  regurgitation is moderate.  14. The aortic valve is tricuspid. Aortic valve regurgitation is mild. No  evidence of aortic valve sclerosis or stenosis.   15. Pulmonic regurgitation is mild.  16. The pulmonic valve was normal in structure. Pulmonic valve  regurgitation is mild.  17. Aortic dilatation noted.  18. There is mild dilatation of the ascending aorta measuring 39 mm.  19. Moderately elevated pulmonary artery systolic pressure.  20. The inferior vena cava is dilated in size with <50% respiratory  variability, suggesting right atrial pressure of 15 mmHg.  21. Moderately sized patent foramen ovale with predominantly left to right  shunting across the atrial septum.  22. Evidence of atrial level shunting detected by color flow Doppler.  23. There is right bowing of the interatrial septum, suggestive of  elevated left atrial pressure.    ASSESSMENT &PLAN:    1. Persistent AF - rate is controled - he remains on anticoagulation as well - no changes made today.   2. Dementia - this is progressive. He has hallucinations as well. Has fair amount of supportive care but at some point will probably require around the clock care.   3. Progressive lower extremity edema - has unna boots in lace.   4. HTN - BP ok here - I suspect his fluctuations are more related to salt use.   5. Chronic anticoagulation - no problems noted - balance is an issue - no falls but will need to monitor.   6. Known CAD - remote PCI - would favor conservative management - he has no worrisome symptoms.   7. Prior echo concerning Amyloid - family opted for conservative care - now with Palliative care.   Current medicines are reviewed with the patient today.  The patient does not have concerns regarding medicines other than what has been noted above.  The following changes have been made:  See above.  Labs/ tests ordered today include:   No orders of the defined types were placed in this encounter.    Disposition:   FU with [name] in 6 months.    Patient is agreeable to this plan  and will call if any problems develop in the interim.   SignedTruitt Merle, NP  02/11/2020 1:52 PM  [facility] [address] [place], [place]  [address] Phone: [phone] Fax: [phone]

## 2020-02-05 DIAGNOSIS — I4819 Other persistent atrial fibrillation: Secondary | ICD-10-CM | POA: Diagnosis not present

## 2020-02-05 DIAGNOSIS — N183 Chronic kidney disease, stage 3 unspecified: Secondary | ICD-10-CM | POA: Diagnosis not present

## 2020-02-05 DIAGNOSIS — L97812 Non-pressure chronic ulcer of other part of right lower leg with fat layer exposed: Secondary | ICD-10-CM | POA: Diagnosis not present

## 2020-02-05 DIAGNOSIS — I129 Hypertensive chronic kidney disease with stage 1 through stage 4 chronic kidney disease, or unspecified chronic kidney disease: Secondary | ICD-10-CM | POA: Diagnosis not present

## 2020-02-05 DIAGNOSIS — G301 Alzheimer's disease with late onset: Secondary | ICD-10-CM | POA: Diagnosis not present

## 2020-02-05 DIAGNOSIS — L97822 Non-pressure chronic ulcer of other part of left lower leg with fat layer exposed: Secondary | ICD-10-CM | POA: Diagnosis not present

## 2020-02-05 DIAGNOSIS — I83028 Varicose veins of left lower extremity with ulcer other part of lower leg: Secondary | ICD-10-CM | POA: Diagnosis not present

## 2020-02-05 DIAGNOSIS — F0281 Dementia in other diseases classified elsewhere with behavioral disturbance: Secondary | ICD-10-CM | POA: Diagnosis not present

## 2020-02-05 DIAGNOSIS — I83018 Varicose veins of right lower extremity with ulcer other part of lower leg: Secondary | ICD-10-CM | POA: Diagnosis not present

## 2020-02-11 ENCOUNTER — Ambulatory Visit: Payer: Medicare HMO | Admitting: Nurse Practitioner

## 2020-02-11 ENCOUNTER — Encounter: Payer: Self-pay | Admitting: Nurse Practitioner

## 2020-02-11 ENCOUNTER — Other Ambulatory Visit: Payer: Self-pay

## 2020-02-11 VITALS — BP 120/76 | HR 87 | Ht 76.0 in | Wt 198.6 lb

## 2020-02-11 DIAGNOSIS — I482 Chronic atrial fibrillation, unspecified: Secondary | ICD-10-CM | POA: Diagnosis not present

## 2020-02-11 DIAGNOSIS — I10 Essential (primary) hypertension: Secondary | ICD-10-CM

## 2020-02-11 DIAGNOSIS — R600 Localized edema: Secondary | ICD-10-CM | POA: Diagnosis not present

## 2020-02-11 DIAGNOSIS — I2510 Atherosclerotic heart disease of native coronary artery without angina pectoris: Secondary | ICD-10-CM

## 2020-02-11 NOTE — Patient Instructions (Addendum)
After Visit Summary:  We will be checking the following labs today - NONE   Medication Instructions:    Continue with your current medicines.    If you need a refill on your cardiac medications before your next appointment, please call your pharmacy.     Testing/Procedures To Be Arranged:  N/A  Follow-Up:   See Dr. Smith in 6 months. You will receive a reminder letter in the mail two months in advance. If you don't receive a letter, please call our office to schedule the follow-up appointment.    At [facility], you and your health needs are our priority.  As part of our continuing mission to provide you with exceptional heart care, we have created designated Provider Care Teams.  These Care Teams include your primary Cardiologist (physician) and Advanced Practice Providers (APPs -  Physician Assistants and Nurse Practitioners) who all work together to provide you with the care you need, when you need it.  Special Instructions:  . Stay safe, wash your hands for at least 20 seconds and wear a mask when needed.  . It was good to talk with you today.    Call the [facility] office at [phone] if you have any questions, problems or concerns.       

## 2020-02-12 DIAGNOSIS — I4819 Other persistent atrial fibrillation: Secondary | ICD-10-CM | POA: Diagnosis not present

## 2020-02-12 DIAGNOSIS — I83028 Varicose veins of left lower extremity with ulcer other part of lower leg: Secondary | ICD-10-CM | POA: Diagnosis not present

## 2020-02-12 DIAGNOSIS — L97812 Non-pressure chronic ulcer of other part of right lower leg with fat layer exposed: Secondary | ICD-10-CM | POA: Diagnosis not present

## 2020-02-12 DIAGNOSIS — G301 Alzheimer's disease with late onset: Secondary | ICD-10-CM | POA: Diagnosis not present

## 2020-02-12 DIAGNOSIS — L97822 Non-pressure chronic ulcer of other part of left lower leg with fat layer exposed: Secondary | ICD-10-CM | POA: Diagnosis not present

## 2020-02-12 DIAGNOSIS — I83018 Varicose veins of right lower extremity with ulcer other part of lower leg: Secondary | ICD-10-CM | POA: Diagnosis not present

## 2020-02-12 DIAGNOSIS — F0281 Dementia in other diseases classified elsewhere with behavioral disturbance: Secondary | ICD-10-CM | POA: Diagnosis not present

## 2020-02-12 DIAGNOSIS — I129 Hypertensive chronic kidney disease with stage 1 through stage 4 chronic kidney disease, or unspecified chronic kidney disease: Secondary | ICD-10-CM | POA: Diagnosis not present

## 2020-02-12 DIAGNOSIS — N183 Chronic kidney disease, stage 3 unspecified: Secondary | ICD-10-CM | POA: Diagnosis not present

## 2020-02-13 DIAGNOSIS — G301 Alzheimer's disease with late onset: Secondary | ICD-10-CM | POA: Diagnosis not present

## 2020-02-13 DIAGNOSIS — I10 Essential (primary) hypertension: Secondary | ICD-10-CM | POA: Diagnosis not present

## 2020-02-13 DIAGNOSIS — J439 Emphysema, unspecified: Secondary | ICD-10-CM | POA: Diagnosis not present

## 2020-02-13 DIAGNOSIS — E7800 Pure hypercholesterolemia, unspecified: Secondary | ICD-10-CM | POA: Diagnosis not present

## 2020-02-18 DIAGNOSIS — I83028 Varicose veins of left lower extremity with ulcer other part of lower leg: Secondary | ICD-10-CM | POA: Diagnosis not present

## 2020-02-18 DIAGNOSIS — I83018 Varicose veins of right lower extremity with ulcer other part of lower leg: Secondary | ICD-10-CM | POA: Diagnosis not present

## 2020-02-18 DIAGNOSIS — N183 Chronic kidney disease, stage 3 unspecified: Secondary | ICD-10-CM | POA: Diagnosis not present

## 2020-02-18 DIAGNOSIS — G301 Alzheimer's disease with late onset: Secondary | ICD-10-CM | POA: Diagnosis not present

## 2020-02-18 DIAGNOSIS — F0281 Dementia in other diseases classified elsewhere with behavioral disturbance: Secondary | ICD-10-CM | POA: Diagnosis not present

## 2020-02-18 DIAGNOSIS — L97812 Non-pressure chronic ulcer of other part of right lower leg with fat layer exposed: Secondary | ICD-10-CM | POA: Diagnosis not present

## 2020-02-18 DIAGNOSIS — I129 Hypertensive chronic kidney disease with stage 1 through stage 4 chronic kidney disease, or unspecified chronic kidney disease: Secondary | ICD-10-CM | POA: Diagnosis not present

## 2020-02-18 DIAGNOSIS — L97822 Non-pressure chronic ulcer of other part of left lower leg with fat layer exposed: Secondary | ICD-10-CM | POA: Diagnosis not present

## 2020-02-18 DIAGNOSIS — I4819 Other persistent atrial fibrillation: Secondary | ICD-10-CM | POA: Diagnosis not present

## 2020-02-22 ENCOUNTER — Ambulatory Visit (HOSPITAL_COMMUNITY)
Admission: EM | Admit: 2020-02-22 | Discharge: 2020-02-22 | Disposition: A | Discharge status: Home / Self Care | Payer: Medicare HMO | Attending: Family Medicine | Admitting: Family Medicine

## 2020-02-22 ENCOUNTER — Encounter (HOSPITAL_COMMUNITY): Payer: Self-pay | Admitting: *Deleted

## 2020-02-22 ENCOUNTER — Other Ambulatory Visit: Payer: Self-pay

## 2020-02-22 DIAGNOSIS — R310 Gross hematuria: Secondary | ICD-10-CM | POA: Diagnosis not present

## 2020-02-22 DIAGNOSIS — R319 Hematuria, unspecified: Secondary | ICD-10-CM

## 2020-02-22 DIAGNOSIS — N390 Urinary tract infection, site not specified: Secondary | ICD-10-CM | POA: Diagnosis not present

## 2020-02-22 LAB — POCT URINALYSIS DIPSTICK, ED / UC
Bilirubin Urine: NEGATIVE
Glucose, UA: NEGATIVE mg/dL
Ketones, ur: NEGATIVE mg/dL
Nitrite: POSITIVE — AB
Protein, ur: 30 mg/dL — AB
Specific Gravity, Urine: 1.02 (ref 1.005–1.030)
Urobilinogen, UA: 1 mg/dL (ref 0.0–1.0)
pH: 6 (ref 5.0–8.0)

## 2020-02-22 MED ORDER — DOXYCYCLINE HYCLATE 100 MG PO TABS: 100.0000 mg | ORAL_TABLET | Freq: Two times a day (BID) | ORAL | 0 refills | Status: DC

## 2020-02-22 NOTE — ED Triage Notes (Signed)
Family reported Pt had a lot of blood in his urine today. Grandson lives with PT.

## 2020-02-22 NOTE — ED Provider Notes (Signed)
Donald Rasmussen    CSN: [id number] Arrival date & time: 02/22/20  1456      History   Chief Complaint Chief Complaint  Patient presents with  . Hematuria    HPI Donald Rasmussen is a 86 y.o. male.   Patient presenting today with caregiver (he has dementia per caregiver) for evaluation of painless gross hematuria x 2-3 days. He denies any associated sxs, including pelvic or abdominal pain, fever, flank pain, N/V/D. He denies this ever having happened to him in the past. Of note, he is on eliquis for atrial fibrillation though has never had any bleeding issues since starting it. Not taking anything OTC for sxs.      Past Medical History:  Diagnosis Date  . Atrial fibrillation ([place]) 03/07/2012   Unknown duration but associated with CHF   . CAD (coronary artery disease), native coronary artery 03/07/2012   Prior MI with BMS RCA   . Cancer [facility])    prostate; Dr. Amalia Hailey  . Chronic kidney disease    Stg III kidney disease; GRF 55  . Chronic low back pain    2000 lumbosacral spine degenerative change. Possible calculus  . Complication of anesthesia   . Coronary artery disease    BMS 1999  . Edentulous   . Essential hypertension 03/07/2012  . Gout   . History of amiodarone therapy 08/12/2013  . Hyperlipidemia   . Hypertension   . Impaired fasting blood sugar   . Long term current use of anticoagulant therapy 08/12/2013  . Mucous cyst of tonsil     Patient Active Problem List   Diagnosis Date Noted  . History of amiodarone therapy 08/12/2013  . Long term current use of anticoagulant therapy 08/12/2013  . Atrial fibrillation ([place]) 03/07/2012    Class: Chronic  . CAD (coronary artery disease), native coronary artery 03/07/2012    Class: Chronic  . Essential hypertension 03/07/2012    Class: Chronic    Past Surgical History:  Procedure Laterality Date  . CARDIAC CATHETERIZATION     stent   . CARDIOVERSION  03/07/2012   Procedure: CARDIOVERSION;  Surgeon: Sinclair Grooms, MD;  Location: [place];  Service: Cardiovascular;  Laterality: N/A;  . HEMORRHOID SURGERY    . RETROPUBIC PROSTATECTOMY  1997  . sebaceous cyst back         Home Medications    Prior to Admission medications   Medication Sig Start Date End Date Taking? Authorizing Provider  acetaminophen (TYLENOL) 500 MG tablet Take 500 mg by mouth every 6 (six) hours as needed (pain).    Yes [provider]  atorvastatin (LIPITOR) 20 MG tablet Take 1 tablet (20 mg total) by mouth daily. 05/07/15  Yes Belva Crome, MD  cholecalciferol (VITAMIN D) 1000 UNITS tablet Take 2,000 Units by mouth daily.   Yes [provider]  ELIQUIS 5 MG TABS tablet TAKE ONE TABLET BY MOUTH AT [facility] AND AT BEDTIME 11/26/19  Yes Belva Crome, MD  fish oil-omega-3 fatty acids 1000 MG capsule Take 1,200 mg by mouth daily.   Yes [provider]  furosemide (LASIX) 20 MG tablet Take 1 tablet (20 mg total) by mouth daily. Take 2 pills for 3 days and then back to one pill daily 09/27/19  Yes Belva Crome, MD  metoprolol tartrate (LOPRESSOR) 25 MG tablet Take 0.5 tablets (12.5 mg total) by mouth 2 (two) times daily. 05/09/19  Yes Daune Perch, NP  nitroGLYCERIN (NITROSTAT) 0.4 MG  SL tablet Place 1 tablet (0.4 mg total) under the tongue every 5 (five) minutes as needed for chest pain. 06/04/15  Yes Belva Crome, MD  quinapril (ACCUPRIL) 20 MG tablet TAKE ONE TABLET BY MOUTH EVERY MORNING 09/03/19  Yes Belva Crome, MD  spironolactone (ALDACTONE) 25 MG tablet Take 0.5 tablets (12.5 mg total) by mouth every Monday, Wednesday, and Friday. 11/06/19  Yes Burtis Junes, NP  allopurinol (ZYLOPRIM) 300 MG tablet Take 300 mg by mouth daily.    [provider]  doxycycline (VIBRA-TABS) 100 MG tablet Take 1 tablet (100 mg total) by mouth 2 (two) times daily. 02/22/20   Volney American, PA-C  EPINEPHrine (EPIPEN JR) 0.15 MG/0.3ML injection Inject 0.15 mg into the muscle as needed for  anaphylaxis.     [provider]  Melatonin 5 MG TABS Take 1 tablet by mouth at bedtime.    [provider]  mirtazapine (REMERON) 15 MG tablet  02/03/20   [provider]    Family History Family History  Problem Relation Age of Onset  . Hyperlipidemia Mother   . Heart attack Mother   . Heart attack Father     Social History Social History   Tobacco Use  . Smoking status: Former Research scientist (life sciences)  . Smokeless tobacco: Current User    Types: Chew  Vaping Use  . Vaping Use: Never used  Substance Use Topics  . Alcohol use: Yes    Alcohol/week: 0.0 standard drinks  . Drug use: No     Allergies   Bee venom   Review of Systems Review of Systems PER HPI    Physical Exam Triage Vital Signs ED Triage Vitals  Enc Vitals Group     BP 02/22/20 1508 (!) 149/68     Pulse Rate 02/22/20 1508 (!) 51     Resp 02/22/20 1508 15     Temp 02/22/20 1508 (!) 97.3 F (36.3 C)     Temp Source 02/22/20 1508 Oral     SpO2 02/22/20 1508 100 %     Weight 02/22/20 1511 198 lb (89.8 kg)     Height 02/22/20 1511 6\' 1"  (1.854 m)     Head Circumference --      Peak Flow --      Pain Score 02/22/20 1511 0     Pain Loc --      Pain Edu? --      Excl. in [place]? --    No data found.  Updated Vital Signs BP (!) 149/68 (BP Location: Right Arm)   Pulse (!) 51   Temp (!) 97.3 F (36.3 C) (Oral)   Resp 15   Ht 6\' 1"  (1.854 m)   Wt 198 lb (89.8 kg)   SpO2 100%   BMI 26.12 kg/m   Visual Acuity Right Eye Distance:   Left Eye Distance:   Bilateral Distance:    Right Eye Near:   Left Eye Near:    Bilateral Near:     Physical Exam Vitals and nursing note reviewed.  Constitutional:      Appearance: Normal appearance.  HENT:     Head: Atraumatic.  Eyes:     Extraocular Movements: Extraocular movements intact.     Conjunctiva/sclera: Conjunctivae normal.  Cardiovascular:     Rate and Rhythm: Normal rate and regular rhythm.  Pulmonary:     Effort: Pulmonary effort  is normal.     Breath sounds: Normal breath sounds.  Abdominal:  General: Bowel sounds are normal. There is no distension.     Palpations: Abdomen is soft.     Tenderness: There is no abdominal tenderness. There is no right CVA tenderness, left CVA tenderness or guarding.  Genitourinary:    Comments: GU exam deferred with shared decision making Musculoskeletal:        General: Normal range of motion.     Cervical back: Normal range of motion and neck supple.  Skin:    General: Skin is warm and dry.  Neurological:     General: No focal deficit present.     Mental Status: He is oriented to person, place, and time.  Psychiatric:        Mood and Affect: Mood normal.        Thought Content: Thought content normal.        Judgment: Judgment normal.      UC Treatments / Results  Labs (all labs ordered are listed, but only abnormal results are displayed) Labs Reviewed  POCT URINALYSIS DIPSTICK, ED / UC - Abnormal; Notable for the following components:      Result Value   Hgb urine dipstick LARGE (*)    Protein, ur 30 (*)    Nitrite POSITIVE (*)    Leukocytes,Ua SMALL (*)    All other components within normal limits  URINE CULTURE    EKG   Radiology No results found.  Procedures Procedures (including critical care time)  Medications Ordered in UC Medications - No data to display  Initial Impression / Assessment and Plan / UC Course  I have reviewed the triage vital signs and the nursing notes.  Pertinent labs & imaging results that were available during my care of the patient were reviewed by me and considered in my medical decision making (see chart for details).     U/A showing evidence of RBCs and UTI. Urine culture pending. Tx with doxycycline, push fluids, f/u for recheck with PCP in next week or two. Return precautions reviewed for worsening sxs.  Final Clinical Impressions(s) / UC Diagnoses   Final diagnoses:  Lower urinary tract infectious disease    Gross hematuria   Discharge Instructions   None    ED Prescriptions    Medication Sig Dispense Auth. Provider   doxycycline (VIBRA-TABS) 100 MG tablet Take 1 tablet (100 mg total) by mouth 2 (two) times daily. 20 tablet Volney American, [place]     PDMP not reviewed this encounter.   Volney American, [place] 02/22/20 1639

## 2020-02-24 LAB — URINE CULTURE: Culture: >=100000 — AB

## 2020-03-10 DIAGNOSIS — E7800 Pure hypercholesterolemia, unspecified: Secondary | ICD-10-CM | POA: Diagnosis not present

## 2020-03-10 DIAGNOSIS — G301 Alzheimer's disease with late onset: Secondary | ICD-10-CM | POA: Diagnosis not present

## 2020-03-10 DIAGNOSIS — I10 Essential (primary) hypertension: Secondary | ICD-10-CM | POA: Diagnosis not present

## 2020-03-10 DIAGNOSIS — I4819 Other persistent atrial fibrillation: Secondary | ICD-10-CM | POA: Diagnosis not present

## 2020-03-10 DIAGNOSIS — J439 Emphysema, unspecified: Secondary | ICD-10-CM | POA: Diagnosis not present

## 2020-03-10 DIAGNOSIS — F0280 Dementia in other diseases classified elsewhere without behavioral disturbance: Secondary | ICD-10-CM | POA: Diagnosis not present

## 2020-03-19 ENCOUNTER — Other Ambulatory Visit: Payer: Medicare HMO | Admitting: Hospice

## 2020-03-19 ENCOUNTER — Other Ambulatory Visit: Payer: Self-pay

## 2020-03-19 DIAGNOSIS — Z515 Encounter for palliative care: Secondary | ICD-10-CM

## 2020-03-19 DIAGNOSIS — F0390 Unspecified dementia without behavioral disturbance: Secondary | ICD-10-CM | POA: Diagnosis not present

## 2020-03-19 NOTE — Progress Notes (Signed)
[place] Consult Note Telephone: [phone]  Fax: [phone]  PATIENT NAME: Donald Rasmussen DOB: [birth year] MRN: [id number]  PRIMARY CARE PROVIDER:   Lajean Manes, MD Lajean Manes, [place] [address] [place],  [place] [address]  REFERRING PROVIDER: Lajean Manes, MD Lajean Manes, [place] [address] [place],  [place] [address]  RESPONSIBLE PARTY:Tommy Snow [phone] - grand son;Libby Hilliad [phone] - best number to call.Edd Arbour- grandson (17 son)[phone] Phone at home: [phone]  RECOMMENDATIONS/PLAN:  Advance Care Planning/Goals of Care: Visit consisted of building trust andfurtherdiscussions on Palliative Medicine as specialized medical care for people living with serious illness, aimed at facilitating better quality of life through symptoms relief, assisting with advance care plan and establishing goals of care.  Edd Arbour is home with patient.  Visit consisted of counseling and education dealing with the complex and emotionally intense issues of symptom management and palliative care in the setting of serious and potentially life-threatening illness. Palliative care team will continue to support patient, patient's family, and medical team.  CODE STATUS:  CODE STATUS reviewed.  Patient is a DO NOT RESUSCITATE.  Signed DNR form at home; same document reuploaded to epic.  Goals of care: Goals of care include to maximize quality of life and symptom management.Family is interestedinHospice services/comfort care when patient qualifies for hospice service.  Follow VZ:DGLOVFIEPP care will continue to follow patient for goals of care clarification and symptom management.Next visit scheduled in 2 months.  Konrad Dolores is expected to be present at the visit for further clarification of goals of care.  Symptom management: No urinary symptoms today. Patient treated for UTI in Oct  '21 with Doxycycline.  Pain:Patient denies pain/discomfort.  No medical acuity, fairly stable in his chronic health conditions.  Bilateral lower extremity edema: Greatly improved, wounds healed, no more Ace wraps.  Continue Aldactone and furosemide as ordered.  Continue feet elevation.  Patient continues on long-term anticoagulation - Eliquis for A. fib; no report of bleeding or palpitations or chest pain or syncope. Ongoing memory loss and confusion related to dementiaFAST 6c, incontinent of bladder FLACC 0.  Continue supportive care. Palliative will continue to monitor for symptom management/decline and make recommendations as needed.  Community/caregiver/family support:   Strongfamilysupport systemin placefor patient: Patient is currently living with [place].  Family involved in patient's care.  I spentone 46providing this consultation; time includestime with patient/family,chart review and documentation. More than 50% of the time in this consultation was spent on coordinating communication  HISTORY OF PRESENT ILLNESS:Donald T McGeeis a 86 y.o.year oldmalewith multiple medical problems including Afib CAD HTNDementia.Patient in full remission for prostate CA.Palliative Care was asked to help address goals of care.   CODE STATUS:DNR   PPS:50%  HOSPICE ELIGIBILITY/DIAGNOSIS: TBD  PAST MEDICAL HISTORY:  Past Medical History:  Diagnosis Date  . Atrial fibrillation ([place]) 03/07/2012   Unknown duration but associated with CHF   . CAD (coronary artery disease), native coronary artery 03/07/2012   Prior MI with BMS RCA   . Cancer [facility])    prostate; Dr. Amalia Hailey  . Chronic kidney disease    Stg III kidney disease; GRF 55  . Chronic low back pain    2000 lumbosacral spine degenerative change. Possible calculus  . Complication of anesthesia   . Coronary artery disease    BMS 1999  . Edentulous   . Essential hypertension 03/07/2012  . Gout   . History  of amiodarone  therapy 08/12/2013  . Hyperlipidemia   . Hypertension   . Impaired fasting blood sugar   . Long term current use of anticoagulant therapy 08/12/2013  . Mucous cyst of tonsil     SOCIAL HX:  Social History   Tobacco Use  . Smoking status: Former Research scientist (life sciences)  . Smokeless tobacco: Current User    Types: Chew  Substance Use Topics  . Alcohol use: Yes    Alcohol/week: 0.0 standard drinks    ALLERGIES:  Allergies  Allergen Reactions  . Bee Venom Swelling     PERTINENT MEDICATIONS:  Outpatient Encounter Medications as of 03/19/2020  Medication Sig  . acetaminophen (TYLENOL) 500 MG tablet Take 500 mg by mouth every 6 (six) hours as needed (pain).   Marland Kitchen allopurinol (ZYLOPRIM) 300 MG tablet Take 300 mg by mouth daily.  Marland Kitchen atorvastatin (LIPITOR) 20 MG tablet Take 1 tablet (20 mg total) by mouth daily.  . cholecalciferol (VITAMIN D) 1000 UNITS tablet Take 2,000 Units by mouth daily.  Marland Kitchen doxycycline (VIBRA-TABS) 100 MG tablet Take 1 tablet (100 mg total) by mouth 2 (two) times daily.  Marland Kitchen ELIQUIS 5 MG TABS tablet TAKE ONE TABLET BY MOUTH AT BREAKFAST AND AT BEDTIME  . EPINEPHrine (EPIPEN JR) 0.15 MG/0.3ML injection Inject 0.15 mg into the muscle as needed for anaphylaxis.   . fish oil-omega-3 fatty acids 1000 MG capsule Take 1,200 mg by mouth daily.  . furosemide (LASIX) 20 MG tablet Take 1 tablet (20 mg total) by mouth daily. Take 2 pills for 3 days and then back to one pill daily  . Melatonin 5 MG TABS Take 1 tablet by mouth at bedtime.  . metoprolol tartrate (LOPRESSOR) 25 MG tablet Take 0.5 tablets (12.5 mg total) by mouth 2 (two) times daily.  . mirtazapine (REMERON) 15 MG tablet   . nitroGLYCERIN (NITROSTAT) 0.4 MG SL tablet Place 1 tablet (0.4 mg total) under the tongue every 5 (five) minutes as needed for chest pain.  Marland Kitchen quinapril (ACCUPRIL) 20 MG tablet TAKE ONE TABLET BY MOUTH EVERY MORNING  . spironolactone (ALDACTONE) 25 MG tablet Take 0.5 tablets (12.5 mg total) by mouth every Monday,  Wednesday, and Friday.   No facility-administered encounter medications on file as of 03/19/2020.    PHYSICAL EXAM/ROS: Ht 6\' 1"  (1.854 m)   Wt 198 lb (89.8 kg) BMI 26.12 kg/m  General: NAD, cooperative Cardiovascular: regular rate and rhythm; denies chest pain Pulmonary: clear ant/post fields; normal respiratory effort on room air Abdomen: soft, nontender, + bowel sounds, no report of constipation GU: no suprapubic tenderness, no report of hematuria/urinary symptoms Extremities: no edema, no joint deformities Skin: no rashes to exposed skin Neurological: Weakness but otherwise nonfocal  Teodoro Spray, NP

## 2020-04-10 DIAGNOSIS — I4819 Other persistent atrial fibrillation: Secondary | ICD-10-CM | POA: Diagnosis not present

## 2020-04-10 DIAGNOSIS — J439 Emphysema, unspecified: Secondary | ICD-10-CM | POA: Diagnosis not present

## 2020-04-10 DIAGNOSIS — F0280 Dementia in other diseases classified elsewhere without behavioral disturbance: Secondary | ICD-10-CM | POA: Diagnosis not present

## 2020-04-10 DIAGNOSIS — G301 Alzheimer's disease with late onset: Secondary | ICD-10-CM | POA: Diagnosis not present

## 2020-04-10 DIAGNOSIS — I10 Essential (primary) hypertension: Secondary | ICD-10-CM | POA: Diagnosis not present

## 2020-04-10 DIAGNOSIS — E7800 Pure hypercholesterolemia, unspecified: Secondary | ICD-10-CM | POA: Diagnosis not present

## 2020-04-13 ENCOUNTER — Other Ambulatory Visit: Payer: Self-pay

## 2020-04-13 MED ORDER — FUROSEMIDE 20 MG PO TABS: 20.0000 mg | ORAL_TABLET | Freq: Every day | ORAL | 3 refills | Status: AC

## 2020-05-08 ENCOUNTER — Telehealth: Payer: Self-pay | Admitting: Interventional Cardiology

## 2020-05-08 ENCOUNTER — Other Ambulatory Visit: Payer: Self-pay | Admitting: *Deleted

## 2020-05-08 MED ORDER — METOPROLOL TARTRATE 25 MG PO TABS: 12.5000 mg | ORAL_TABLET | Freq: Two times a day (BID) | ORAL | 1 refills | Status: AC

## 2020-05-08 NOTE — Telephone Encounter (Signed)
Pt c/o medication issue:  1. Name of Medication: metoprolol tartrate (LOPRESSOR) 25 MG tablet  2. How are you currently taking this medication (dosage and times per day)? As written   3. Are you having a reaction (difficulty breathing--STAT)? No   4. What is your medication issue? Patient needs a new prescription signed by [name]. Patient is out of medication

## 2020-05-08 NOTE — Telephone Encounter (Signed)
Prescription sent to [facility]

## 2020-05-11 ENCOUNTER — Other Ambulatory Visit: Payer: Medicare HMO | Admitting: Hospice

## 2020-05-11 ENCOUNTER — Other Ambulatory Visit: Payer: Self-pay

## 2020-05-11 DIAGNOSIS — F0390 Unspecified dementia without behavioral disturbance: Secondary | ICD-10-CM

## 2020-05-11 DIAGNOSIS — Z515 Encounter for palliative care: Secondary | ICD-10-CM | POA: Diagnosis not present

## 2020-05-11 NOTE — Progress Notes (Signed)
[facility] Consult Note Telephone: [phone]  Fax: [phone]  PATIENT NAME: Donald Rasmussen DOB: [birth year] MRN: [id number]  PRIMARY CARE PROVIDER:   Lajean Manes, MD Lajean Manes, [place] [address] [place],  [place] [address]  REFERRING PROVIDER: Lajean Manes, MD Lajean Manes, [place] [address] [place],  [place] [address]  RESPONSIBLE PARTY:Tommy Emogene Morgan [phone] - grand son; Bethel Born Langley grandson(Libby's son)[phone] Phone at home: [place]. Is Ronnie or Tommy  RECOMMENDATIONS/PLAN:  Advance Care Planning/Goals of Care: Visit consisted of building trust andfollow-up on palliative care.   CODE STATUS: CODE STATUS reviewed.  Patient is a DO NOT RESUSCITATE. Signed DNR form at home.  Goals of care: Goals of care include to maximize quality of life and symptom management. Discussion with Tommy on values and preferences as it relates to medical orders for scope of treatment.  Most selections today include do not attempt resuscitation, limited additional intervention, IV fluids/antibiotics as indicated, no feeding tube.  Family is interestedinHospice services/comfort carewhen patient qualifies forhospice service.Palliative care will continue to provide support to patient, family and the medical team.  Follow up: Follow-up visit scheduled in85months.  Symptom management:Worsening memory loss and confusion related to dementiaFAST 6c, occasional wandering, incontinent of bladder FLACC 0.  Discussion on dementia disease trajectory.  Encouraged routine walk with patient around his compound to satisfy that need.  Use of cane/rolling walker safety and to prevent fall. Continue supportive care Pain:Patient denies pain/discomfort.  No medical acuity. Bilateral lower extremity edema: Greatly improved, wounds healed, no more Ace wraps at this time.   Continue Aldactone and furosemide as ordered.  Continue feet elevation. Patient continues on long-term anticoagulation -Eliquis for A. fib; no report of bleeding or palpitations or chest pain or syncope. Palliative will continue to monitor for symptom management/decline and make recommendations as needed.  Community/caregiver/family support:  Strongfamilysupport systemin placefor patient: Patient is currently living with [place].  Family- [name], [place] all involved in patient's care.  I spentone85providing this consultation; time includestime with patient/family,chart review and documentation. More than 50% of the time in this consultation was spent on coordinating communication  HISTORY OF PRESENT ILLNESS:Donald T McGeeis a 87 y.o.year oldmalewith multiple medical problems including Dementia, Afib CAD HTN.Patient in full remission for prostate CA.Palliative Care was asked to help address goals of care.   CODE STATUS:DNR   PPS:60% HOSPICE ELIGIBILITY/DIAGNOSIS: TBD  PAST MEDICAL HISTORY:  Past Medical History:  Diagnosis Date  . Atrial fibrillation ([place]) 03/07/2012   Unknown duration but associated with CHF   . CAD (coronary artery disease), native coronary artery 03/07/2012   Prior MI with BMS RCA   . Cancer [facility])    prostate; Dr. Amalia Hailey  . Chronic kidney disease    Stg III kidney disease; GRF 55  . Chronic low back pain    2000 lumbosacral spine degenerative change. Possible calculus  . Complication of anesthesia   . Coronary artery disease    BMS 1999  . Edentulous   . Essential hypertension 03/07/2012  . Gout   . History of amiodarone therapy 08/12/2013  . Hyperlipidemia   . Hypertension   . Impaired fasting blood sugar   . Long term current use of anticoagulant therapy 08/12/2013  . Mucous cyst of tonsil     SOCIAL HX:  Social History   Tobacco Use  . Smoking status: Former Research scientist (life sciences)  .  Smokeless tobacco: Current User    Types: Chew  Substance  Use Topics  . Alcohol use: Yes    Alcohol/week: 0.0 standard drinks    ALLERGIES:  Allergies  Allergen Reactions  . Bee Venom Swelling     PERTINENT MEDICATIONS:  Outpatient Encounter Medications as of 05/11/2020  Medication Sig  . acetaminophen (TYLENOL) 500 MG tablet Take 500 mg by mouth every 6 (six) hours as needed (pain).   Marland Kitchen allopurinol (ZYLOPRIM) 300 MG tablet Take 300 mg by mouth daily.  Marland Kitchen atorvastatin (LIPITOR) 20 MG tablet Take 1 tablet (20 mg total) by mouth daily.  . cholecalciferol (VITAMIN D) 1000 UNITS tablet Take 2,000 Units by mouth daily.  Marland Kitchen doxycycline (VIBRA-TABS) 100 MG tablet Take 1 tablet (100 mg total) by mouth 2 (two) times daily.  Marland Kitchen ELIQUIS 5 MG TABS tablet TAKE ONE TABLET BY MOUTH AT BREAKFAST AND AT BEDTIME  . EPINEPHrine (EPIPEN JR) 0.15 MG/0.3ML injection Inject 0.15 mg into the muscle as needed for anaphylaxis.   . fish oil-omega-3 fatty acids 1000 MG capsule Take 1,200 mg by mouth daily.  . furosemide (LASIX) 20 MG tablet Take 1 tablet (20 mg total) by mouth daily.  . Melatonin 5 MG TABS Take 1 tablet by mouth at bedtime.  . metoprolol tartrate (LOPRESSOR) 25 MG tablet Take 0.5 tablets (12.5 mg total) by mouth 2 (two) times daily.  . mirtazapine (REMERON) 15 MG tablet   . nitroGLYCERIN (NITROSTAT) 0.4 MG SL tablet Place 1 tablet (0.4 mg total) under the tongue every 5 (five) minutes as needed for chest pain.  Marland Kitchen quinapril (ACCUPRIL) 20 MG tablet TAKE ONE TABLET BY MOUTH EVERY MORNING  . spironolactone (ALDACTONE) 25 MG tablet Take 0.5 tablets (12.5 mg total) by mouth every Monday, Wednesday, and Friday.   No facility-administered encounter medications on file as of 05/11/2020.    PHYSICAL EXAM/ROS:  Ht 6\' 1"  (1.854 m)  Wt 198 lb (89.8 kg) BMI 26.12 kg/m  General: NAD, cooperative Cardiovascular: regular rate and rhythm; denies chest pain Pulmonary: No respiratory distress, normal respiratory effort on room air Abdomen: soft, nontender, + bowel  sounds, no report of constipation GU: no suprapubic tenderness, no report of hematuria/urinary symptoms Extremities: no edema, no joint deformities Skin: no rashes to exposed skin Neurological: Weakness but otherwise nonfocal  Note:  Portions of this note were generated with Dragon dictation software. Dictation errors may occur despite attempts at proofreading.  Teodoro Spray, NP

## 2020-05-29 DIAGNOSIS — J439 Emphysema, unspecified: Secondary | ICD-10-CM | POA: Diagnosis not present

## 2020-05-29 DIAGNOSIS — F0280 Dementia in other diseases classified elsewhere without behavioral disturbance: Secondary | ICD-10-CM | POA: Diagnosis not present

## 2020-05-29 DIAGNOSIS — I4819 Other persistent atrial fibrillation: Secondary | ICD-10-CM | POA: Diagnosis not present

## 2020-05-29 DIAGNOSIS — I10 Essential (primary) hypertension: Secondary | ICD-10-CM | POA: Diagnosis not present

## 2020-05-29 DIAGNOSIS — E7800 Pure hypercholesterolemia, unspecified: Secondary | ICD-10-CM | POA: Diagnosis not present

## 2020-05-29 DIAGNOSIS — G301 Alzheimer's disease with late onset: Secondary | ICD-10-CM | POA: Diagnosis not present

## 2020-06-09 DIAGNOSIS — J439 Emphysema, unspecified: Secondary | ICD-10-CM | POA: Diagnosis not present

## 2020-06-09 DIAGNOSIS — I10 Essential (primary) hypertension: Secondary | ICD-10-CM | POA: Diagnosis not present

## 2020-06-09 DIAGNOSIS — G301 Alzheimer's disease with late onset: Secondary | ICD-10-CM | POA: Diagnosis not present

## 2020-06-09 DIAGNOSIS — E7800 Pure hypercholesterolemia, unspecified: Secondary | ICD-10-CM | POA: Diagnosis not present

## 2020-06-22 ENCOUNTER — Other Ambulatory Visit: Payer: Self-pay

## 2020-06-22 ENCOUNTER — Emergency Department (HOSPITAL_COMMUNITY): Payer: No Typology Code available for payment source

## 2020-06-22 ENCOUNTER — Encounter (HOSPITAL_COMMUNITY): Payer: Self-pay | Admitting: Emergency Medicine

## 2020-06-22 ENCOUNTER — Inpatient Hospital Stay (HOSPITAL_COMMUNITY)
Admission: EM | Admit: 2020-06-22 | Discharge: 2020-06-30 | DRG: 177 | Disposition: E | Payer: No Typology Code available for payment source | Attending: Family Medicine | Admitting: Family Medicine

## 2020-06-22 ENCOUNTER — Inpatient Hospital Stay (HOSPITAL_COMMUNITY): Payer: No Typology Code available for payment source

## 2020-06-22 DIAGNOSIS — R1310 Dysphagia, unspecified: Secondary | ICD-10-CM | POA: Diagnosis present

## 2020-06-22 DIAGNOSIS — Z955 Presence of coronary angioplasty implant and graft: Secondary | ICD-10-CM

## 2020-06-22 DIAGNOSIS — F0391 Unspecified dementia with behavioral disturbance: Secondary | ICD-10-CM | POA: Diagnosis present

## 2020-06-22 DIAGNOSIS — J90 Pleural effusion, not elsewhere classified: Secondary | ICD-10-CM | POA: Diagnosis not present

## 2020-06-22 DIAGNOSIS — I252 Old myocardial infarction: Secondary | ICD-10-CM | POA: Diagnosis not present

## 2020-06-22 DIAGNOSIS — I872 Venous insufficiency (chronic) (peripheral): Secondary | ICD-10-CM | POA: Diagnosis present

## 2020-06-22 DIAGNOSIS — I248 Other forms of acute ischemic heart disease: Secondary | ICD-10-CM | POA: Diagnosis present

## 2020-06-22 DIAGNOSIS — E785 Hyperlipidemia, unspecified: Secondary | ICD-10-CM

## 2020-06-22 DIAGNOSIS — Z8249 Family history of ischemic heart disease and other diseases of the circulatory system: Secondary | ICD-10-CM

## 2020-06-22 DIAGNOSIS — N1831 Chronic kidney disease, stage 3a: Secondary | ICD-10-CM | POA: Diagnosis present

## 2020-06-22 DIAGNOSIS — J690 Pneumonitis due to inhalation of food and vomit: Secondary | ICD-10-CM | POA: Diagnosis present

## 2020-06-22 DIAGNOSIS — F17220 Nicotine dependence, chewing tobacco, uncomplicated: Secondary | ICD-10-CM | POA: Diagnosis present

## 2020-06-22 DIAGNOSIS — I509 Heart failure, unspecified: Secondary | ICD-10-CM

## 2020-06-22 DIAGNOSIS — Z9079 Acquired absence of other genital organ(s): Secondary | ICD-10-CM | POA: Diagnosis not present

## 2020-06-22 DIAGNOSIS — I2510 Atherosclerotic heart disease of native coronary artery without angina pectoris: Secondary | ICD-10-CM | POA: Diagnosis not present

## 2020-06-22 DIAGNOSIS — Z7901 Long term (current) use of anticoagulants: Secondary | ICD-10-CM

## 2020-06-22 DIAGNOSIS — I4819 Other persistent atrial fibrillation: Secondary | ICD-10-CM | POA: Diagnosis present

## 2020-06-22 DIAGNOSIS — I2699 Other pulmonary embolism without acute cor pulmonale: Secondary | ICD-10-CM | POA: Diagnosis not present

## 2020-06-22 DIAGNOSIS — I10 Essential (primary) hypertension: Secondary | ICD-10-CM | POA: Diagnosis not present

## 2020-06-22 DIAGNOSIS — Z9183 Wandering in diseases classified elsewhere: Secondary | ICD-10-CM | POA: Diagnosis not present

## 2020-06-22 DIAGNOSIS — M109 Gout, unspecified: Secondary | ICD-10-CM | POA: Diagnosis present

## 2020-06-22 DIAGNOSIS — I493 Ventricular premature depolarization: Secondary | ICD-10-CM | POA: Diagnosis present

## 2020-06-22 DIAGNOSIS — I4891 Unspecified atrial fibrillation: Secondary | ICD-10-CM | POA: Diagnosis present

## 2020-06-22 DIAGNOSIS — Z20822 Contact with and (suspected) exposure to covid-19: Secondary | ICD-10-CM | POA: Diagnosis present

## 2020-06-22 DIAGNOSIS — Z66 Do not resuscitate: Secondary | ICD-10-CM | POA: Diagnosis present

## 2020-06-22 DIAGNOSIS — M545 Low back pain, unspecified: Secondary | ICD-10-CM | POA: Diagnosis present

## 2020-06-22 DIAGNOSIS — R0902 Hypoxemia: Secondary | ICD-10-CM

## 2020-06-22 DIAGNOSIS — J189 Pneumonia, unspecified organism: Secondary | ICD-10-CM

## 2020-06-22 DIAGNOSIS — I130 Hypertensive heart and chronic kidney disease with heart failure and stage 1 through stage 4 chronic kidney disease, or unspecified chronic kidney disease: Secondary | ICD-10-CM | POA: Diagnosis present

## 2020-06-22 DIAGNOSIS — I517 Cardiomegaly: Secondary | ICD-10-CM | POA: Diagnosis not present

## 2020-06-22 DIAGNOSIS — I5033 Acute on chronic diastolic (congestive) heart failure: Secondary | ICD-10-CM | POA: Diagnosis present

## 2020-06-22 DIAGNOSIS — J80 Acute respiratory distress syndrome: Secondary | ICD-10-CM | POA: Diagnosis not present

## 2020-06-22 DIAGNOSIS — E1165 Type 2 diabetes mellitus with hyperglycemia: Secondary | ICD-10-CM | POA: Diagnosis not present

## 2020-06-22 DIAGNOSIS — F17200 Nicotine dependence, unspecified, uncomplicated: Secondary | ICD-10-CM

## 2020-06-22 DIAGNOSIS — G8929 Other chronic pain: Secondary | ICD-10-CM | POA: Diagnosis present

## 2020-06-22 DIAGNOSIS — R0602 Shortness of breath: Secondary | ICD-10-CM | POA: Diagnosis not present

## 2020-06-22 DIAGNOSIS — J9601 Acute respiratory failure with hypoxia: Secondary | ICD-10-CM | POA: Diagnosis present

## 2020-06-22 DIAGNOSIS — Z83438 Family history of other disorder of lipoprotein metabolism and other lipidemia: Secondary | ICD-10-CM | POA: Diagnosis not present

## 2020-06-22 DIAGNOSIS — Z8546 Personal history of malignant neoplasm of prostate: Secondary | ICD-10-CM

## 2020-06-22 DIAGNOSIS — F03918 Unspecified dementia, unspecified severity, with other behavioral disturbance: Secondary | ICD-10-CM | POA: Diagnosis present

## 2020-06-22 HISTORY — DX: Nicotine dependence, unspecified, uncomplicated: F17.200

## 2020-06-22 HISTORY — DX: Unspecified dementia, unspecified severity, with other behavioral disturbance: F03.918

## 2020-06-22 HISTORY — DX: Hyperlipidemia, unspecified: E78.5

## 2020-06-22 HISTORY — DX: Venous insufficiency (chronic) (peripheral): I87.2

## 2020-06-22 HISTORY — DX: Unspecified dementia with behavioral disturbance: F03.91

## 2020-06-22 LAB — BASIC METABOLIC PANEL
Anion gap: 11 (ref 5–15)
BUN: 12 mg/dL (ref 8–23)
CO2: 32 mmol/L (ref 22–32)
Calcium: 9 mg/dL (ref 8.9–10.3)
Chloride: 101 mmol/L (ref 98–111)
Creatinine, Ser: 1.16 mg/dL (ref 0.61–1.24)
GFR, Estimated: >60 mL/min (ref >60)
Glucose, Bld: 182 mg/dL — ABNORMAL HIGH (ref 70–99)
Potassium: 3.3 mmol/L — ABNORMAL LOW (ref 3.5–5.1)
Sodium: 144 mmol/L (ref 135–145)

## 2020-06-22 LAB — CBC
HCT: 41.5 % (ref 39.0–52.0)
Hemoglobin: 13.1 g/dL (ref 13.0–17.0)
MCH: 31 pg (ref 26.0–34.0)
MCHC: 31.6 g/dL (ref 30.0–36.0)
MCV: 98.1 fL (ref 80.0–100.0)
Platelets: 210 10*3/uL (ref 150–400)
RBC: 4.23 MIL/uL (ref 4.22–5.81)
RDW: 14.1 % (ref 11.5–15.5)
WBC: 13.2 10*3/uL — ABNORMAL HIGH (ref 4.0–10.5)
nRBC: 0 % (ref 0.0–0.2)

## 2020-06-22 LAB — RESP PANEL BY RT-PCR (FLU A&B, COVID) ARPGX2
Influenza A by PCR: NEGATIVE
Influenza B by PCR: NEGATIVE
SARS Coronavirus 2 by RT PCR: NEGATIVE

## 2020-06-22 LAB — TROPONIN I (HIGH SENSITIVITY)
Troponin I (High Sensitivity): 75 ng/L — ABNORMAL HIGH (ref <18)
Troponin I (High Sensitivity): 119 ng/L (ref <18)

## 2020-06-22 LAB — STREP PNEUMONIAE URINARY ANTIGEN: Strep Pneumo Urinary Antigen: NEGATIVE

## 2020-06-22 LAB — BRAIN NATRIURETIC PEPTIDE: B Natriuretic Peptide: 1124.3 pg/mL — ABNORMAL HIGH (ref 0.0–100.0)

## 2020-06-22 LAB — PROCALCITONIN: Procalcitonin: 0.45 ng/mL

## 2020-06-22 MED ORDER — POTASSIUM CHLORIDE CRYS ER 10 MEQ PO TBCR
40.0000 meq | EXTENDED_RELEASE_TABLET | Freq: Two times a day (BID) | ORAL | Status: DC
  Administered 2020-06-22 – 2020-06-24 (×4): 40 meq via ORAL
  Filled 2020-06-22 (×6): qty 4

## 2020-06-22 MED ORDER — SODIUM CHLORIDE 0.9 % IV SOLN
1.0000 g | Freq: Once | INTRAVENOUS | Status: AC
  Administered 2020-06-22: 1 g via INTRAVENOUS
  Filled 2020-06-22: qty 10

## 2020-06-22 MED ORDER — NICOTINE 14 MG/24HR TD PT24
14.0000 mg | MEDICATED_PATCH | Freq: Every day | TRANSDERMAL | Status: DC
  Administered 2020-06-22 – 2020-06-24 (×3): 14 mg via TRANSDERMAL
  Filled 2020-06-22 (×3): qty 1

## 2020-06-22 MED ORDER — ALLOPURINOL 300 MG PO TABS
300.0000 mg | ORAL_TABLET | Freq: Every day | ORAL | Status: DC
  Administered 2020-06-23 – 2020-06-24 (×2): 300 mg via ORAL
  Filled 2020-06-22 (×2): qty 1

## 2020-06-22 MED ORDER — FUROSEMIDE 40 MG PO TABS
40.0000 mg | ORAL_TABLET | Freq: Every day | ORAL | Status: DC
  Administered 2020-06-22 – 2020-06-24 (×3): 40 mg via ORAL
  Filled 2020-06-22 (×3): qty 1

## 2020-06-22 MED ORDER — SODIUM CHLORIDE 0.9 % IV SOLN
500.0000 mg | Freq: Once | INTRAVENOUS | Status: AC
  Administered 2020-06-22: 500 mg via INTRAVENOUS
  Filled 2020-06-22: qty 500

## 2020-06-22 MED ORDER — SODIUM CHLORIDE 0.9 % IV SOLN
2.0000 g | INTRAVENOUS | Status: DC
  Administered 2020-06-23: 2 g via INTRAVENOUS
  Filled 2020-06-22: qty 20
  Filled 2020-06-22: qty 2

## 2020-06-22 MED ORDER — HALOPERIDOL 1 MG PO TABS
5.0000 mg | ORAL_TABLET | Freq: Four times a day (QID) | ORAL | Status: DC | PRN
  Administered 2020-06-22 – 2020-06-23 (×2): 5 mg via ORAL
  Filled 2020-06-22 (×2): qty 5

## 2020-06-22 MED ORDER — SODIUM CHLORIDE 0.9 % IV SOLN
INTRAVENOUS | Status: DC
  Administered 2020-06-24: 1 mL via INTRAVENOUS

## 2020-06-22 MED ORDER — MIRTAZAPINE 15 MG PO TABS
15.0000 mg | ORAL_TABLET | Freq: Every day | ORAL | Status: DC
  Administered 2020-06-22 – 2020-06-23 (×2): 15 mg via ORAL
  Filled 2020-06-22 (×3): qty 1

## 2020-06-22 MED ORDER — QUINAPRIL HCL 10 MG PO TABS
20.0000 mg | ORAL_TABLET | Freq: Every day | ORAL | Status: DC
  Administered 2020-06-23 – 2020-06-24 (×2): 20 mg via ORAL
  Filled 2020-06-22 (×2): qty 2

## 2020-06-22 MED ORDER — IOHEXOL 350 MG/ML SOLN
50.0000 mL | Freq: Once | INTRAVENOUS | Status: AC | PRN
  Administered 2020-06-22: 50 mL via INTRAVENOUS

## 2020-06-22 MED ORDER — SODIUM CHLORIDE 0.9 % IV SOLN
500.0000 mg | INTRAVENOUS | Status: DC
  Administered 2020-06-23: 500 mg via INTRAVENOUS
  Filled 2020-06-22 (×2): qty 500

## 2020-06-22 MED ORDER — METOPROLOL TARTRATE 12.5 MG HALF TABLET
12.5000 mg | ORAL_TABLET | Freq: Two times a day (BID) | ORAL | Status: DC
  Administered 2020-06-22 – 2020-06-23 (×2): 12.5 mg via ORAL
  Filled 2020-06-22 (×2): qty 1

## 2020-06-22 MED ORDER — BENZTROPINE MESYLATE 1 MG PO TABS
1.0000 mg | ORAL_TABLET | Freq: Four times a day (QID) | ORAL | Status: DC | PRN
  Administered 2020-06-23 (×2): 1 mg via ORAL
  Filled 2020-06-22: qty 1

## 2020-06-22 MED ORDER — APIXABAN 5 MG PO TABS
5.0000 mg | ORAL_TABLET | Freq: Two times a day (BID) | ORAL | Status: DC
  Administered 2020-06-22 – 2020-06-24 (×4): 5 mg via ORAL
  Filled 2020-06-22 (×4): qty 1

## 2020-06-22 MED ORDER — ATORVASTATIN CALCIUM 10 MG PO TABS
20.0000 mg | ORAL_TABLET | Freq: Every day | ORAL | Status: DC
  Administered 2020-06-23 – 2020-06-24 (×2): 20 mg via ORAL
  Filled 2020-06-22 (×2): qty 2

## 2020-06-22 MED ORDER — MELATONIN 5 MG PO TABS
5.0000 mg | ORAL_TABLET | Freq: Every evening | ORAL | Status: DC | PRN
  Administered 2020-06-23: 5 mg via ORAL
  Filled 2020-06-22 (×2): qty 1

## 2020-06-22 MED ORDER — QUETIAPINE FUMARATE 25 MG PO TABS
25.0000 mg | ORAL_TABLET | Freq: Every day | ORAL | Status: DC
  Administered 2020-06-22: 25 mg via ORAL
  Filled 2020-06-22 (×2): qty 1

## 2020-06-22 NOTE — H&P (Addendum)
History and Physical    Donald Rasmussen LPF:[id number] DOB: 1933/04/02 DOA: 06/20/2020  PCP: Lajean Manes, MD Consultants:  Amalia Hailey - urology; Tamala Julian -cardiology; palliative care Patient coming from:  Home - lives with grandson, Whitney Post; NOK: Daughter, Charletta Cousin, [phone]; Rayburn Felt, [phone] [facility])  Chief Complaint:  SOB  HPI: Donald Rasmussen is a 87 y.o. male with medical history significant of HTN; HLD; dementia; CAD s/p stent; remote ETOH dependence (quit about 1 year ago); ongoing oral tobacco dependence; chronic low back pain; stage 3a CKD; prostate CA; and afib on Eliquis presenting with SOB. Patient is alone during the day and has family members who bring meals and then has a nighttime caregiver.  His grandson went to give him breakfast this AM and found him outside wandering - he seemed out of sorts and was having trouble breathing.  He sat down and he didn't want to eat breakfast.  He was visibly SOB.  EMS reported fever and gave Tylenol.  ?fluid on his lungs, O2 sats 82%.  He was at his baseline last night, in a reasonably good.  No recent cough.  He continues to use chewing tobacco, concern for choking hazard as he falls asleep with it in his mouth.  He is having some pill dysphagia.  He has baseline dementia with mood disturbance.  He wanders at baseline.  Mostly independent with ADLs - occasional urge incontinence, needs help with showering.    ED Course:  Hypoxia due to CAP.  Has dementia, no family present.  SOB, placed on 15L NRB by EMS - > 6L [place] O2.  BPs ok.  COVID negative but read as multilobar B bronchopneumonia.  Mild troponin elevation without CP or EKG changes.  Given Rocephin/Azithro.     Review of Systems: As per HPI; otherwise review of systems reviewed and negative.   Ambulatory Status:  Ambulates without assistance or with a walker  COVID Vaccine Status:   Complete plus booster  Past Medical History:  Diagnosis Date   . Atrial fibrillation ([place]) 03/07/2012   Unknown duration but associated with CHF   . CAD (coronary artery disease), native coronary artery 03/07/2012   Prior MI with BMS RCA   . Cancer [facility])    prostate; Dr. Amalia Hailey  . Chronic kidney disease    Stg III kidney disease; GRF 55  . Chronic low back pain    2000 lumbosacral spine degenerative change. Possible calculus  . Complication of anesthesia   . Dementia with behavioral disturbance ([place]) 06/18/2020  . Dyslipidemia 06/20/2020  . Edentulous   . Essential hypertension 03/07/2012  . Gout   . History of amiodarone therapy 08/12/2013  . Hyperlipidemia   . Impaired fasting blood sugar   . Long term current use of anticoagulant therapy 08/12/2013  . Mucous cyst of tonsil   . Stasis dermatitis of both legs 06/28/2020  . Tobacco dependence 06/27/2020    Past Surgical History:  Procedure Laterality Date  . CARDIAC CATHETERIZATION     stent   . CARDIOVERSION  03/07/2012   Procedure: CARDIOVERSION;  Surgeon: Sinclair Grooms, MD;  Location: [place];  Service: Cardiovascular;  Laterality: N/A;  . HEMORRHOID SURGERY    . RETROPUBIC PROSTATECTOMY  1997  . sebaceous cyst back      Social History   Socioeconomic History  . Marital status: Married    Spouse name: Not on file  . Number of children: Not on file  . Years of  education: Not on file  . Highest education level: Not on file  Occupational History  . Occupation: retired  Tobacco Use  . Smoking status: Former Research scientist (life sciences)  . Smokeless tobacco: Current User    Types: Chew  Vaping Use  . Vaping Use: Never used  Substance and Sexual Activity  . Alcohol use: Not Currently    Alcohol/week: 0.0 standard drinks    Comment: none in about a year  . Drug use: No  . Sexual activity: Not on file  Other Topics Concern  . Not on file  Social History Narrative  . Not on file   Social Determinants of Health   Financial Resource Strain: Not on file  Food Insecurity: Not on file   Transportation Needs: Not on file  Physical Activity: Not on file  Stress: Not on file  Social Connections: Not on file  Intimate Partner Violence: Not on file    Allergies  Allergen Reactions  . Bee Venom Swelling    Family History  Problem Relation Age of Onset  . Hyperlipidemia Mother   . Heart attack Mother   . Heart attack Father     Prior to Admission medications   Medication Sig Start Date End Date Taking? Authorizing Provider  acetaminophen (TYLENOL) 500 MG tablet Take 500 mg by mouth every 6 (six) hours as needed (pain).     [provider]  allopurinol (ZYLOPRIM) 300 MG tablet Take 300 mg by mouth daily.    [provider]  atorvastatin (LIPITOR) 20 MG tablet Take 1 tablet (20 mg total) by mouth daily. 05/07/15   Belva Crome, MD  cholecalciferol (VITAMIN D) 1000 UNITS tablet Take 2,000 Units by mouth daily.    [provider]  doxycycline (VIBRA-TABS) 100 MG tablet Take 1 tablet (100 mg total) by mouth 2 (two) times daily. 02/22/20   Volney American, PA-C  ELIQUIS 5 MG TABS tablet TAKE ONE TABLET BY MOUTH AT [facility] AND AT BEDTIME 11/26/19   Belva Crome, MD  EPINEPHrine [facility]) 0.15 MG/0.3ML injection Inject 0.15 mg into the muscle as needed for anaphylaxis.     [provider]  fish oil-omega-3 fatty acids 1000 MG capsule Take 1,200 mg by mouth daily.    [provider]  furosemide (LASIX) 20 MG tablet Take 1 tablet (20 mg total) by mouth daily. 04/13/20   Belva Crome, MD  Melatonin 5 MG TABS Take 1 tablet by mouth at bedtime.    [provider]  metoprolol tartrate (LOPRESSOR) 25 MG tablet Take 0.5 tablets (12.5 mg total) by mouth 2 (two) times daily. 05/08/20   Belva Crome, MD  mirtazapine (REMERON) 15 MG tablet  02/03/20   [provider]  nitroGLYCERIN (NITROSTAT) 0.4 MG SL tablet Place 1 tablet (0.4 mg total) under the tongue every 5 (five) minutes as needed for chest pain. 06/04/15    Belva Crome, MD  quinapril (ACCUPRIL) 20 MG tablet TAKE ONE TABLET BY MOUTH EVERY MORNING 09/03/19   Belva Crome, MD  spironolactone (ALDACTONE) 25 MG tablet Take 0.5 tablets (12.5 mg total) by mouth every Monday, Wednesday, and Friday. 11/06/19   Burtis Junes, NP    Physical Exam: Vitals:   06/04/2020 1400 06/28/2020 1500 06/17/2020 1645 06/15/2020 1649  BP: (!) 148/87 134/72  130/70  Pulse: 83 91 86 86  Resp: (!) 22 20 (!) 26 (!) 26  Temp:    98.4 F (36.9 C)  TempSrc:  Oral  SpO2: 100% 99% 100% 100%  Weight:      Height:         . General:  Appears calm and comfortable and is in NAD; mildly somnolent, pleasant . Eyes:  PERRL, EOMI, normal lids, iris . ENT:  grossly normal hearing, lips & tongue, mmm . Neck:  no LAD, masses or thyromegaly . Cardiovascular:  RRR, no m/r/g. 1+ LE edema.  Marland Kitchen Respiratory:   Scattered rhonchi.  Normal respiratory effort. . Abdomen:  soft, NT, ND, NABS . Skin:  B LE stasis ulcerations with mild R > L stasis dermatitis; R knee abrasion from apparent fall     . Musculoskeletal:  grossly normal tone BUE/BLE, good ROM, no bony abnormality . Psychiatric:  grossly normal mood and affect, speech sparse but appropriate, AOx1 . Neurologic:  CN 2-12 grossly intact, moves all extremities in coordinated fashion    Radiological Exams on Admission: Independently reviewed - see discussion in A/P where applicable  CT Angio Chest PE W/Cm &/Or Wo Cm  Result Date: 06/14/2020 CLINICAL DATA:  PE suspected, shortness of breath EXAM: CT ANGIOGRAPHY CHEST WITH CONTRAST TECHNIQUE: Multidetector CT imaging of the chest was performed using the standard protocol during bolus administration of intravenous contrast. Multiplanar CT image reconstructions and MIPs were obtained to evaluate the vascular anatomy. CONTRAST:  63mL OMNIPAQUE IOHEXOL 350 MG/ML SOLN COMPARISON:  06/06/2016 FINDINGS: Cardiovascular: Satisfactory opacification of the pulmonary arteries to the segmental  level. No evidence of pulmonary embolism. Cardiomegaly. Extensive 3 vessel coronary artery calcifications and/or stents. No pericardial effusion. Enlargement of the main pulmonary artery, measuring up to 3.7 cm in caliber. Aortic atherosclerosis. Mediastinum/Nodes: Enlarged pretracheal and bilateral hilar lymph nodes, largest pretracheal node measuring up to 2.0 x 2.0 cm (series 5, image 69). Thyroid gland, trachea, and esophagus demonstrate no significant findings. Lungs/Pleura: Mild centrilobular emphysema. Diffuse bilateral bronchial wall thickening. Extensive, predominantly dependent bibasilar heterogeneous airspace opacity, most notable in the right lower lobe (series 6, image 98). Small bilateral pleural effusions. Upper Abdomen: No acute abnormality. Musculoskeletal: No chest wall abnormality. No acute or significant osseous findings. Review of the MIP images confirms the above findings. IMPRESSION: 1.  Negative examination for pulmonary embolism. 2. Diffuse bilateral bronchial wall thickening and extensive, predominantly dependent bibasilar heterogeneous airspace opacity with small associated pleural effusions. Findings are consistent with infection or aspiration. 3.  Mild emphysema. 4.  Enlarged mediastinal lymph nodes, likely reactive. 5.  Cardiomegaly and coronary artery disease. 6. Enlargement of the main pulmonary artery, as can be seen in pulmonary hypertension. Aortic Atherosclerosis (ICD10-I70.0). Electronically Signed   By: Eddie Candle M.D.   On: 06/26/2020 16:22   DG Chest Portable 1 View  Result Date: 06/03/2020 CLINICAL DATA:  87 year old male with history of worsening shortness of breath since yesterday. EXAM: PORTABLE CHEST 1 VIEW COMPARISON:  Chest x-ray 06/02/2018. FINDINGS: Lung volumes are low. Patchy multifocal airspace disease most evident throughout the right upper, right lower and left lower lungs. Relative sparing of the left upper lobe. No definite pleural effusions. Widespread  areas of interstitial prominence. No evidence of pulmonary edema. No pneumothorax. Heart size is mildly enlarged. Fullness in the right hilar region. The patient is rotated to the right on today's exam, resulting in distortion of the mediastinal contours and reduced diagnostic sensitivity and specificity for mediastinal pathology. Aortic atherosclerosis. IMPRESSION: 1. The appearance of the chest suggests developing multilobar bilateral bronchopneumonia. Right-sided hilar fullness is noted. Close attention in this region on follow-up studies is recommended,  as the possibility of underlying mass and/or lymphadenopathy is not excluded. If there is strong clinical concern for malignancy, further evaluation with contrast enhanced chest CT could be considered at this time. 2. Mild cardiomegaly. 3. Aortic atherosclerosis. Electronically Signed   By: Vinnie Langton M.D.   On: 06/17/2020 13:13    EKG: Independently reviewed.   1540 - Afib with rate 78; IVCD, LVH; nonspecific ST changes that appear to be unchanged from prior 1608 - Afib with rate 72; IVCD, LVH; more prominent ST changes in anterior leads   Labs on Admission: I have personally reviewed the available labs and imaging studies at the time of the admission.  Pertinent labs:   K+ 3.3 Glucose 182 BNP 1124.3 HS troponin 75 WBC 13.2 COVID/flu negative   Assessment/Plan Principal Problem:   Multifocal pneumonia Active Problems:   Atrial fibrillation (HCC)   CAD (coronary artery disease), native coronary artery   Essential hypertension   Dysphagia   Dementia with behavioral disturbance (HCC)   Stasis dermatitis of both legs   Dyslipidemia   Tobacco dependence   DNR (do not resuscitate)   Multifocal PNA -Patient presenting with worsening confusion, SOB, hypoxia to 82% -CXR with multifocal infiltrates; chest CT ruled out PE but confirms multifocal PNA, ?aspiration -He does have advanced dementia and has had recent pill dysphagia -  aspiration is a distinct consideration -Influenza negative. -COVID-19 negative. -Blood and sputum cultures are pending -Will order lower respiratory tract procalcitonin level.   >0.5 indicates infection and >>0.5 indicates more serious disease.  As the procalcitonin level normalizes, it will be reasonable to consider de-escalation of antibiotic coverage.  The sensitivity of procalcitonin is variable and should not be used alone to guide treatment. -CURB-65 score is 2 - will admit the patient to Telemetry. -Pneumonia Severity Index (PSI) is Class 3, 1% mortality. -Will start Azithromycin 500 mg IV daily and Rocephin due to no risk factors for MDR cause -NS @ 75cc/hr -Fever control -Repeat CBC in am  Dysphagia -Recent concern for pill dysphagia -Given advanced dementia and current PNA, aspiration is a clear consideration -Will increase Rocephin dose to 2 grams q24h -Will keep NPO until ST is able to do a swallow evaluation  HTN -Continue Lopressor, quinapril -Hold diuretics - Lasix and Aldactone - in the setting of active infection, decreased (currently NO) PO intake  Dementia -Has behavior disturbance -Tends to sleep during the day, exhibits wandering behaviors and agitation at night -Continue home Melatonin, Remeron, Seroquel -Will add prn IV Haldol  CAD -s/p stent -Grandson reports intermittent CP at baseline, none in the last 24 hours -Minimally elevated troponin with positive delta -Repeat EKG with possible ST elevation -Unlikely STEMI or even NSTEMI, more likely demand ischemia -Also, patient is not likely to be an intervention candidate -Will request cardiology consult to make sure there is no further investigation needed  Stasis dermatitis with ulcerations -Appears to be generally well controlled and managed well by his grandson -Will request wound care consult -Currently low suspicion for infection  Stage 3a CKD -Appears to be stable at this time -Will  trend -Continue Allopurinol  HLD -Continue Lipitor  Prostate CA -Appears to be remote  Afib -Continue Eliquis  H/o tobacco dependence -Oral tobacco use, encourage cessation.   -Patch ordered   DNR -I have discussed code status with the patient's grandson and the patient would not desire resuscitation and would prefer to die a natural death should that situation arise.    Note: This patient has  been tested and is negative for the novel coronavirus COVID-19. He has been fully vaccinated against COVID-19.    DVT prophylaxis: Eliquis Code Status:  DNR - confirmed with patient Family Communication: Grandson Social research officer, government) was present throughout evaluation Disposition Plan:  The patient is from: home  Anticipated d/c is to: home without [facility] services   Anticipated d/c date will depend on clinical response to treatment, likely several days  Patient is currently: acutely ill Consults called: ST; Wound Care Admission status: Admit - It is my clinical opinion that admission to INPATIENT is reasonable and necessary because of the expectation that this patient will require hospital care that crosses at least 2 midnights to treat this condition based on the medical complexity of the problems presented.  Given the aforementioned information, the predictability of an adverse outcome is felt to be significant.     Karmen Bongo MD [facility]   How to contact the [facility] Attending or Consulting provider [place] or covering provider during after hours [place], for this patient?  1. Check the care team in [facility] and look for a) attending/consulting TR[facility] provider listed and b) the [facility] team listed 2. Log into [email or web address] and use [place]'s universal password to access. If you do not have the password, please contact the hospital operator. 3. Locate the [facility] provider you are looking for under [facility] and page to a number that you can be directly reached. 4. If you still have  difficulty reaching the provider, please page the [facility] (Director on Call) for the Hospitalists listed on amion for assistance.   06/07/2020, 5:42 PM

## 2020-06-22 NOTE — ED Notes (Signed)
Pt taken to CT.

## 2020-06-22 NOTE — ED Triage Notes (Signed)
Pt arrives to ED triage via [facility] with c/o shortness of breath. Pt w/ hx of CHF and dementia, lives with family. Family states SOB starting yesterday and worsening today. Pt placed on 15L NRB by fire.

## 2020-06-22 NOTE — ED Notes (Signed)
Pt attempted to get out of bed and removed IV and ED equipment, Pt does not want to get back in bed, This RN told pt he can sit on the side of the bed

## 2020-06-22 NOTE — ED Provider Notes (Signed)
[facility] EMERGENCY DEPARTMENT Provider Note   CSN: [id number] Arrival date & time: 06/18/2020  1159     History Chief Complaint  Patient presents with  . Shortness of Breath    Donald Rasmussen is a 87 y.o. male.  HPI   87 year old male with past medical history of dementia, CAD, HTN, HLD, atrial fibrillation on Eliquis, CKD presents the emergency department with concern for shortness of breath.  There is no family at bedside.  Patient is pleasantly demented, not a reliable historian but cooperative.  He currently denies any chest pain.  Past Medical History:  Diagnosis Date  . Atrial fibrillation ([place]) 03/07/2012   Unknown duration but associated with CHF   . CAD (coronary artery disease), native coronary artery 03/07/2012   Prior MI with BMS RCA   . Cancer [facility])    prostate; Dr. Amalia Hailey  . Chronic kidney disease    Stg III kidney disease; GRF 55  . Chronic low back pain    2000 lumbosacral spine degenerative change. Possible calculus  . Complication of anesthesia   . Edentulous   . Essential hypertension 03/07/2012  . Gout   . History of amiodarone therapy 08/12/2013  . Hyperlipidemia   . Impaired fasting blood sugar   . Long term current use of anticoagulant therapy 08/12/2013  . Mucous cyst of tonsil     Patient Active Problem List   Diagnosis Date Noted  . Multifocal pneumonia 06/17/2020  . History of amiodarone therapy 08/12/2013  . Long term current use of anticoagulant therapy 08/12/2013  . Atrial fibrillation ([place]) 03/07/2012    Class: Chronic  . CAD (coronary artery disease), native coronary artery 03/07/2012    Class: Chronic  . Essential hypertension 03/07/2012    Class: Chronic    Past Surgical History:  Procedure Laterality Date  . CARDIAC CATHETERIZATION     stent   . CARDIOVERSION  03/07/2012   Procedure: CARDIOVERSION;  Surgeon: Sinclair Grooms, MD;  Location: [facility];  Service: Cardiovascular;  Laterality: N/A;  .  HEMORRHOID SURGERY    . RETROPUBIC PROSTATECTOMY  1997  . sebaceous cyst back         Family History  Problem Relation Age of Onset  . Hyperlipidemia Mother   . Heart attack Mother   . Heart attack Father     Social History   Tobacco Use  . Smoking status: Former Research scientist (life sciences)  . Smokeless tobacco: Current User    Types: Chew  Vaping Use  . Vaping Use: Never used  Substance Use Topics  . Alcohol use: Not Currently    Alcohol/week: 0.0 standard drinks    Comment: none in about a year  . Drug use: No    Home Medications Prior to Admission medications   Medication Sig Start Date End Date Taking? Authorizing Provider  acetaminophen (TYLENOL) 500 MG tablet Take 500 mg by mouth every 6 (six) hours as needed (pain).    Yes [provider]  allopurinol (ZYLOPRIM) 300 MG tablet Take 300 mg by mouth daily.   Yes [provider]  atorvastatin (LIPITOR) 20 MG tablet Take 1 tablet (20 mg total) by mouth daily. 05/07/15  Yes Belva Crome, MD  cholecalciferol (VITAMIN D) 1000 UNITS tablet Take 2,000 Units by mouth daily.   Yes [provider]  ELIQUIS 5 MG TABS tablet TAKE ONE TABLET BY MOUTH AT BREAKFAST AND AT BEDTIME Patient taking differently: Take 5 mg by mouth 2 (two) times daily.  11/26/19  Yes Belva Crome, MD  EPINEPHrine [facility]) 0.15 MG/0.3ML injection Inject 0.15 mg into the muscle as needed for anaphylaxis.    Yes [provider]  fish oil-omega-3 fatty acids 1000 MG capsule Take 1,200 mg by mouth daily.   Yes [provider]  furosemide (LASIX) 20 MG tablet Take 1 tablet (20 mg total) by mouth daily. 04/13/20  Yes Belva Crome, MD  Melatonin 5 MG TABS Take 1 tablet by mouth at bedtime.   Yes [provider]  metoprolol tartrate (LOPRESSOR) 25 MG tablet Take 0.5 tablets (12.5 mg total) by mouth 2 (two) times daily. 05/08/20  Yes Belva Crome, MD  mirtazapine (REMERON) 15 MG tablet Take 15 mg by mouth at bedtime. 02/03/20  Yes  [provider]  nitroGLYCERIN (NITROSTAT) 0.4 MG SL tablet Place 1 tablet (0.4 mg total) under the tongue every 5 (five) minutes as needed for chest pain. 06/04/15  Yes Belva Crome, MD  QUEtiapine (SEROQUEL) 25 MG tablet Take 25 mg by mouth at bedtime. 06/09/20  Yes [provider]  quinapril (ACCUPRIL) 20 MG tablet TAKE ONE TABLET BY MOUTH EVERY MORNING Patient taking differently: Take 20 mg by mouth daily. 09/03/19  Yes Belva Crome, MD  spironolactone (ALDACTONE) 25 MG tablet Take 0.5 tablets (12.5 mg total) by mouth every Monday, Wednesday, and Friday. 11/06/19  Yes Burtis Junes, NP    Allergies    Bee venom  Review of Systems   Review of Systems  Unable to perform ROS: Dementia    Physical Exam Updated Vital Signs BP 134/72   Pulse 91   Temp 98.6 F (37 C) (Oral)   Resp 20   Ht 6\' 1"  (1.854 m)   Wt 90 kg   SpO2 99%   BMI 26.18 kg/m   Physical Exam Vitals and nursing note reviewed.  Constitutional:      Appearance: Normal appearance.  HENT:     Head: Normocephalic.     Mouth/Throat:     Mouth: Mucous membranes are moist.  Cardiovascular:     Rate and Rhythm: Normal rate.  Pulmonary:     Effort: Pulmonary effort is normal. Tachypnea present. No respiratory distress.     Breath sounds: Rales present.  Abdominal:     Palpations: Abdomen is soft.     Tenderness: There is no abdominal tenderness.  Musculoskeletal:     Right lower leg: Edema present.  Skin:    General: Skin is warm.  Neurological:     Mental Status: He is alert and oriented to person, place, and time. Mental status is at baseline.  Psychiatric:        Mood and Affect: Mood normal.     ED Results / Procedures / Treatments   Labs (all labs ordered are listed, but only abnormal results are displayed) Labs Reviewed  BASIC METABOLIC PANEL - Abnormal; Notable for the following components:      Result Value   Potassium 3.3 (*)    Glucose, Bld 182 (*)    All other components  within normal limits  CBC - Abnormal; Notable for the following components:   WBC 13.2 (*)    All other components within normal limits  BRAIN NATRIURETIC PEPTIDE - Abnormal; Notable for the following components:   B Natriuretic Peptide 1,124.3 (*)    All other components within normal limits  TROPONIN I (HIGH SENSITIVITY) - Abnormal; Notable for the following components:   Troponin I (High  Sensitivity) 75 (*)    All other components within normal limits  RESP PANEL BY RT-PCR (FLU A&B, COVID) ARPGX2  TROPONIN I (HIGH SENSITIVITY)    EKG None  Radiology DG Chest Portable 1 View  Result Date: 06/07/2020 CLINICAL DATA:  87 year old male with history of worsening shortness of breath since yesterday. EXAM: PORTABLE CHEST 1 VIEW COMPARISON:  Chest x-ray 06/02/2018. FINDINGS: Lung volumes are low. Patchy multifocal airspace disease most evident throughout the right upper, right lower and left lower lungs. Relative sparing of the left upper lobe. No definite pleural effusions. Widespread areas of interstitial prominence. No evidence of pulmonary edema. No pneumothorax. Heart size is mildly enlarged. Fullness in the right hilar region. The patient is rotated to the right on today's exam, resulting in distortion of the mediastinal contours and reduced diagnostic sensitivity and specificity for mediastinal pathology. Aortic atherosclerosis. IMPRESSION: 1. The appearance of the chest suggests developing multilobar bilateral bronchopneumonia. Right-sided hilar fullness is noted. Close attention in this region on follow-up studies is recommended, as the possibility of underlying mass and/or lymphadenopathy is not excluded. If there is strong clinical concern for malignancy, further evaluation with contrast enhanced chest CT could be considered at this time. 2. Mild cardiomegaly. 3. Aortic atherosclerosis. Electronically Signed   By: Vinnie Langton M.D.   On: 06/17/2020 13:13    Procedures Procedures    Medications Ordered in ED Medications  azithromycin (ZITHROMAX) 500 mg in sodium chloride 0.9 % 250 mL IVPB (500 mg Intravenous New Bag/Given 06/23/2020 1530)  cefTRIAXone (ROCEPHIN) 1 g in sodium chloride 0.9 % 100 mL IVPB (0 g Intravenous Stopped 06/14/2020 1529)    ED Course  I have reviewed the triage vital signs and the nursing notes.  Pertinent labs & imaging results that were available during my care of the patient were reviewed by me and considered in my medical decision making (see chart for details).    MDM Rules/Calculators/A&P                          87 year old pleasantly demented male presents the emergency department with report of shortness of breath.  Patient was hypoxic, requiring nasal cannula.  He is otherwise sitting up, comfortable and conversational although an unreliable historian.  No significant electrolyte abnormalities.  Covid is negative.  Chest x-ray looks concerning for areas of multifocal pneumonia.  Troponin is elevated at 75, no previous to compare to,most likely demand, BNP is also elevated.  Patient is afebrile.  Will be admitted for hypoxia and treatment for community-acquired pneumonia, we will do a CT study to rule out PE.  Patient will be admitted regardless for treatment.  Patients evaluation and results requires admission for further treatment and care. Patient agrees with admission plan, offers no new complaints and is stable/unchanged at time of admit.  Final Clinical Impression(s) / ED Diagnoses Final diagnoses:  Hypoxia  Community acquired pneumonia, unspecified laterality  Congestive heart failure, unspecified HF chronicity, unspecified heart failure type [facility])    Rx / DC Orders ED Discharge Orders    None       Lorelle Gibbs, DO 06/12/2020 1536

## 2020-06-22 NOTE — Consult Note (Signed)
Cardiology Consultation:   Patient ID: CARMINO OCAIN MRN: [id number]; DOB: November 17, 1932  Admit date: 06/23/2020 Date of Consult: 06/21/2020  PCP:  Lajean Manes, MD   [place]  Cardiologist:  Sinclair Grooms, MD  Advanced Practice Provider:  No care team member to display Electrophysiologist:  None   Patient Profile:   Donald Rasmussen is a 87 y.o. male with a hx of CAD status post PCI, hypertension, hyperlipidemia tobacco abuse, dementia, CKD 3, prostate cancer, paroxysmal atrial fibrillation and prior alcohol abuse who is being seen today for the evaluation of elevated troponin at the request of Dr. Lorin Mercy.  History of Present Illness:   Donald Rasmussen was brought to the ED because his grandson noted he was short of breath.  He is accompanied by his other grandson who has POA.  At baseline he has dementia with mood disturbance.  There is report of pill dysphagia and falling asleep with food in his mouth.  In the ED he was noted to be hypoxic and short of breath.  He was placed on 15 L nonrebreather by EMS Covid testing was negative but he was found to have multilobular bronchopneumonia.  High-sensitivity troponin was elevated to 75 and then 119 on repeat.  Therefore cardiology was consulted.  EKG reveals atrial fibrillation with LVH and secondary repolarization abnormalities unchanged from baseline.  BNP elevated to 1124.  Chest CT revealed extensive three-vessel coronary calcification.  There were enlarged paratracheal and hilar lymph nodes as well as mild centrilobular emphysema.  Findings were concerning for infection versus aspiration.  The pulmonary artery was enlarged consistent with pulmonary hypertension.  Mr. Amis is unable to provide much history due to dementia.  He currently denies chest pain but states that he thinks he hurts all over.  He isn't sure.  He isn't short of breath but but thinks he may be if he tried to walk around.  He has been coughing but states  that it is non-productive.  He doesn't know where he is or why he is here.  His grandson notes that his edema is at baseline.  He uses [employer] boots and has been told that it is due to venous insufficiency.   Past Medical History:  Diagnosis Date  . Atrial fibrillation ([place]) 03/07/2012   Unknown duration but associated with CHF   . CAD (coronary artery disease), native coronary artery 03/07/2012   Prior MI with BMS RCA   . Cancer [facility])    prostate; Dr. Amalia Hailey  . Chronic kidney disease    Stg III kidney disease; GRF 55  . Chronic low back pain    2000 lumbosacral spine degenerative change. Possible calculus  . Complication of anesthesia   . Dementia with behavioral disturbance ([place]) 06/14/2020  . Dyslipidemia 06/17/2020  . Edentulous   . Essential hypertension 03/07/2012  . Gout   . History of amiodarone therapy 08/12/2013  . Hyperlipidemia   . Impaired fasting blood sugar   . Long term current use of anticoagulant therapy 08/12/2013  . Mucous cyst of tonsil   . Stasis dermatitis of both legs 06/13/2020  . Tobacco dependence 06/05/2020    Past Surgical History:  Procedure Laterality Date  . CARDIAC CATHETERIZATION     stent   . CARDIOVERSION  03/07/2012   Procedure: CARDIOVERSION;  Surgeon: Sinclair Grooms, MD;  Location: [place];  Service: Cardiovascular;  Laterality: N/A;  . HEMORRHOID SURGERY    . RETROPUBIC PROSTATECTOMY  1997  .  sebaceous cyst back       Home Medications:  Prior to Admission medications   Medication Sig Start Date End Date Taking? Authorizing Provider  acetaminophen (TYLENOL) 500 MG tablet Take 500 mg by mouth every 6 (six) hours as needed (pain).    Yes [provider]  allopurinol (ZYLOPRIM) 300 MG tablet Take 300 mg by mouth daily.   Yes [provider]  atorvastatin (LIPITOR) 20 MG tablet Take 1 tablet (20 mg total) by mouth daily. 05/07/15  Yes Belva Crome, MD  cholecalciferol (VITAMIN D) 1000 UNITS tablet Take 2,000 Units by mouth  daily.   Yes [provider]  ELIQUIS 5 MG TABS tablet TAKE ONE TABLET BY MOUTH AT BREAKFAST AND AT BEDTIME Patient taking differently: Take 5 mg by mouth 2 (two) times daily. 11/26/19  Yes Belva Crome, MD  EPINEPHrine [facility]) 0.15 MG/0.3ML injection Inject 0.15 mg into the muscle as needed for anaphylaxis.    Yes [provider]  fish oil-omega-3 fatty acids 1000 MG capsule Take 1,200 mg by mouth daily.   Yes [provider]  furosemide (LASIX) 20 MG tablet Take 1 tablet (20 mg total) by mouth daily. 04/13/20  Yes Belva Crome, MD  Melatonin 5 MG TABS Take 1 tablet by mouth at bedtime.   Yes [provider]  metoprolol tartrate (LOPRESSOR) 25 MG tablet Take 0.5 tablets (12.5 mg total) by mouth 2 (two) times daily. 05/08/20  Yes Belva Crome, MD  mirtazapine (REMERON) 15 MG tablet Take 15 mg by mouth at bedtime. 02/03/20  Yes [provider]  nitroGLYCERIN (NITROSTAT) 0.4 MG SL tablet Place 1 tablet (0.4 mg total) under the tongue every 5 (five) minutes as needed for chest pain. 06/04/15  Yes Belva Crome, MD  QUEtiapine (SEROQUEL) 25 MG tablet Take 25 mg by mouth at bedtime. 06/09/20  Yes [provider]  quinapril (ACCUPRIL) 20 MG tablet TAKE ONE TABLET BY MOUTH EVERY MORNING Patient taking differently: Take 20 mg by mouth daily. 09/03/19  Yes Belva Crome, MD  spironolactone (ALDACTONE) 25 MG tablet Take 0.5 tablets (12.5 mg total) by mouth every Monday, Wednesday, and Friday. 11/06/19  Yes Burtis Junes, NP    Inpatient Medications: Scheduled Meds: . [START ON 06/23/2020] allopurinol  300 mg Oral Daily  . apixaban  5 mg Oral BID  . [START ON 06/23/2020] atorvastatin  20 mg Oral Daily  . furosemide  40 mg Oral Daily  . metoprolol tartrate  12.5 mg Oral BID  . mirtazapine  15 mg Oral QHS  . nicotine  14 mg Transdermal Daily  . potassium chloride  40 mEq Oral BID  . QUEtiapine  25 mg Oral QHS  . [START ON 06/23/2020] quinapril  20  mg Oral Daily   Continuous Infusions: . sodium chloride 75 mL/hr at 06/05/2020 1700  . [START ON 06/23/2020] azithromycin    . [START ON 06/23/2020] cefTRIAXone (ROCEPHIN)  IV     PRN Meds: haloperidol **AND** benztropine, melatonin  Allergies:    Allergies  Allergen Reactions  . Bee Venom Swelling    Social History:   Social History   Socioeconomic History  . Marital status: Married    Spouse name: Not on file  . Number of children: Not on file  . Years of education: Not on file  . Highest education level: Not on file  Occupational History  . Occupation: retired  Tobacco Use  . Smoking status: Former Research scientist (life sciences)  .  Smokeless tobacco: Current User    Types: Chew  Vaping Use  . Vaping Use: Never used  Substance and Sexual Activity  . Alcohol use: Not Currently    Alcohol/week: 0.0 standard drinks    Comment: none in about a year  . Drug use: No  . Sexual activity: Not on file  Other Topics Concern  . Not on file  Social History Narrative  . Not on file   Social Determinants of Health   Financial Resource Strain: Not on file  Food Insecurity: Not on file  Transportation Needs: Not on file  Physical Activity: Not on file  Stress: Not on file  Social Connections: Not on file  Intimate Partner Violence: Not on file    Family History:    Family History  Problem Relation Age of Onset  . Hyperlipidemia Mother   . Heart attack Mother   . Heart attack Father      ROS:  Please see the history of present illness.  All other ROS reviewed and negative.     Physical Exam/Data:   Vitals:   06/27/2020 1400 06/20/2020 1500 06/25/2020 1645 06/21/2020 1649  BP: (!) 148/87 134/72  130/70  Pulse: 83 91 86 86  Resp: (!) 22 20 (!) 26 (!) 26  Temp:    98.4 F (36.9 C)  TempSrc:    Oral  SpO2: 100% 99% 100% 100%  Weight:      Height:       No intake or output data in the 24 hours ending 06/13/2020 1851 Last 3 Weights 06/27/2020 02/22/2020 02/11/2020  Weight (lbs) 198 lb 6.6 oz  198 lb 198 lb 9.6 oz  Weight (kg) 90 kg 89.812 kg 90.084 kg     VS:  BP 130/70 (BP Location: Right Arm)   Pulse 86   Temp 98.4 F (36.9 C) (Oral)   Resp (!) 26   Ht 6\' 1"  (1.854 m)   Wt 90 kg   SpO2 100%   BMI 26.18 kg/m  , BMI Body mass index is 26.18 kg/m. GENERAL:  Well appearing HEENT: Pupils equal round and reactive, fundi not visualized, oral mucosa unremarkable NECK:  No jugular venous distention, waveform within normal limits, carotid upstroke brisk and symmetric, no bruits LUNGS:  Bilateral rhonchi.  No crackles or wheezes. HEART:  RRR.  PMI not displaced or sustained,S1 and S2 within normal limits, no S3, no S4, no clicks, no rubs, II/VI systolic murmur ABD:  Flat, positive bowel sounds normal in frequency in pitch, no bruits, no rebound, no guarding, no midline pulsatile mass, no hepatomegaly, no splenomegaly EXT:  2 plus pulses throughout, 1+ bilateral LE edema, no cyanosis no clubbing SKIN:  No rashes no nodules NEURO:  Cranial nerves II through XII grossly intact, motor grossly intact throughout PSYCH:  Cognitively intact, oriented to person place and time   EKG:  The EKG was personally reviewed and demonstrates: Atrial fibrillation.  Rate 72 bpm.  LAFB.  LVH with secondary repolarization abnormalities Telemetry:  Telemetry was personally reviewed and demonstrates:  Atrial fibrillation.  Rate <100 bpm.  PVCs.  4 beats NSVT.  Relevant CV Studies:  Echo 05/31/19: 1. LV hypertrophy, with LV muscle appearance consistent with possible  amyloid. While diastology cannot be fully evaluated due to afib, enlarged  LA and low tissue doppler velocities consistent with possible infiltrative  process. Recommend PYP scan if  clinical concern for amyloid exists.  2. Prominent IAS bowing to right with PFO with left to right  flow. Bubble  study negative for right to left shunting.  3. Left ventricular ejection fraction, by visual estimation, is 60 to  65%. The left ventricle has  normal function. There is moderately increased  left ventricular hypertrophy.  4. Left ventricular diastolic function could not be evaluated.  5. The left ventricle has no regional wall motion abnormalities.  6. Global right ventricle has normal systolic function.The right  ventricular size is mildly enlarged. No increase in right ventricular wall  thickness.  7. Left atrial size was severely dilated.  8. Right atrial size was severely dilated.  9. Trivial pericardial effusion is present.  10. Moderate mitral annular calcification.  11. The mitral valve is degenerative. Mild mitral valve regurgitation.  12. The tricuspid valve is normal in structure.  13. The tricuspid valve is normal in structure. Tricuspid valve  regurgitation is moderate.  14. The aortic valve is tricuspid. Aortic valve regurgitation is mild. No  evidence of aortic valve sclerosis or stenosis.  15. Pulmonic regurgitation is mild.  16. The pulmonic valve was normal in structure. Pulmonic valve  regurgitation is mild.  17. Aortic dilatation noted.  18. There is mild dilatation of the ascending aorta measuring 39 mm.  19. Moderately elevated pulmonary artery systolic pressure.  20. The inferior vena cava is dilated in size with <50% respiratory  variability, suggesting right atrial pressure of 15 mmHg.  21. Moderately sized patent foramen ovale with predominantly left to right  shunting across the atrial septum.  22. Evidence of atrial level shunting detected by color flow Doppler.  23. There is right bowing of the interatrial septum, suggestive of  elevated left atrial pressure.   Laboratory Data:  High Sensitivity Troponin:   Recent Labs  Lab 06/12/2020 1257 06/14/2020 1454  TROPONINIHS 75* 119*     Chemistry Recent Labs  Lab 06/23/2020 1248  NA 144  K 3.3*  CL 101  CO2 32  GLUCOSE 182*  BUN 12  CREATININE 1.16  CALCIUM 9.0  GFRNONAA >60  ANIONGAP 11    No results for input(s): PROT, ALBUMIN,  AST, ALT, ALKPHOS, BILITOT in the last 168 hours. Hematology Recent Labs  Lab 06/29/2020 1248  WBC 13.2*  RBC 4.23  HGB 13.1  HCT 41.5  MCV 98.1  MCH 31.0  MCHC 31.6  RDW 14.1  PLT 210   BNP Recent Labs  Lab 06/17/2020 1257  BNP 1,124.3*    DDimer No results for input(s): DDIMER in the last 168 hours.   Radiology/Studies:  CT Angio Chest PE W/Cm &/Or Wo Cm  Result Date: 06/16/2020 CLINICAL DATA:  PE suspected, shortness of breath EXAM: CT ANGIOGRAPHY CHEST WITH CONTRAST TECHNIQUE: Multidetector CT imaging of the chest was performed using the standard protocol during bolus administration of intravenous contrast. Multiplanar CT image reconstructions and MIPs were obtained to evaluate the vascular anatomy. CONTRAST:  73mL OMNIPAQUE IOHEXOL 350 MG/ML SOLN COMPARISON:  06/06/2016 FINDINGS: Cardiovascular: Satisfactory opacification of the pulmonary arteries to the segmental level. No evidence of pulmonary embolism. Cardiomegaly. Extensive 3 vessel coronary artery calcifications and/or stents. No pericardial effusion. Enlargement of the main pulmonary artery, measuring up to 3.7 cm in caliber. Aortic atherosclerosis. Mediastinum/Nodes: Enlarged pretracheal and bilateral hilar lymph nodes, largest pretracheal node measuring up to 2.0 x 2.0 cm (series 5, image 69). Thyroid gland, trachea, and esophagus demonstrate no significant findings. Lungs/Pleura: Mild centrilobular emphysema. Diffuse bilateral bronchial wall thickening. Extensive, predominantly dependent bibasilar heterogeneous airspace opacity, most notable in the right lower lobe (series 6,  image 98). Small bilateral pleural effusions. Upper Abdomen: No acute abnormality. Musculoskeletal: No chest wall abnormality. No acute or significant osseous findings. Review of the MIP images confirms the above findings. IMPRESSION: 1.  Negative examination for pulmonary embolism. 2. Diffuse bilateral bronchial wall thickening and extensive, predominantly  dependent bibasilar heterogeneous airspace opacity with small associated pleural effusions. Findings are consistent with infection or aspiration. 3.  Mild emphysema. 4.  Enlarged mediastinal lymph nodes, likely reactive. 5.  Cardiomegaly and coronary artery disease. 6. Enlargement of the main pulmonary artery, as can be seen in pulmonary hypertension. Aortic Atherosclerosis (ICD10-I70.0). Electronically Signed   By: Eddie Candle M.D.   On: 06/28/2020 16:22   DG Chest Portable 1 View  Result Date: 06/07/2020 CLINICAL DATA:  87 year old male with history of worsening shortness of breath since yesterday. EXAM: PORTABLE CHEST 1 VIEW COMPARISON:  Chest x-ray 06/02/2018. FINDINGS: Lung volumes are low. Patchy multifocal airspace disease most evident throughout the right upper, right lower and left lower lungs. Relative sparing of the left upper lobe. No definite pleural effusions. Widespread areas of interstitial prominence. No evidence of pulmonary edema. No pneumothorax. Heart size is mildly enlarged. Fullness in the right hilar region. The patient is rotated to the right on today's exam, resulting in distortion of the mediastinal contours and reduced diagnostic sensitivity and specificity for mediastinal pathology. Aortic atherosclerosis. IMPRESSION: 1. The appearance of the chest suggests developing multilobar bilateral bronchopneumonia. Right-sided hilar fullness is noted. Close attention in this region on follow-up studies is recommended, as the possibility of underlying mass and/or lymphadenopathy is not excluded. If there is strong clinical concern for malignancy, further evaluation with contrast enhanced chest CT could be considered at this time. 2. Mild cardiomegaly. 3. Aortic atherosclerosis. Electronically Signed   By: Vinnie Langton M.D.   On: 06/04/2020 13:13     Assessment and Plan:   # Elevated troponin: Likely related to demand ischemia in the setting of CAP.  He has no chest pain.  EKG is  stable with LVH with secondary repolarization abnormality.  No plan for ischemia evaluation, especially given his advanced dementia.    # Acute on chronic diastolic heart failure: # Hypertension:  Prior echo concerning for amyloidosis.  Further work up declined by family.  He has LE edema that is at his baseline.  Weight stable from outpatient visit 01/2020.  We will increase his home lasix 20mg  to 40mg  po for now.  Continue quinapril and metoprolol.  He takes spironolactone 12.5mg  qMWF.  # Persistent atrial fibrillation: # PVCs;  Rate well-controlled.  Continue Eliquis and metoprolol.  Supplement potassium.     Risk Assessment/Risk Scores:  {Complete the following score calculators/questions to meet   New York Heart Association (NYHA) Functional Class NYHA Class II  CHA2DS2-VASc Score = 5  This indicates a 7.2% annual risk of stroke. The patient's score is based upon: CHF History: No HTN History: No Diabetes History: Yes Stroke History: Yes Vascular Disease History: No Age Score: 2 Gender Score: 0         For questions or updates, please contact Angie Please consult [email or web address] for contact info under    Signed, Skeet Latch, MD  06/12/2020 6:51 PM

## 2020-06-23 DIAGNOSIS — I2510 Atherosclerotic heart disease of native coronary artery without angina pectoris: Secondary | ICD-10-CM | POA: Diagnosis not present

## 2020-06-23 DIAGNOSIS — F0391 Unspecified dementia with behavioral disturbance: Secondary | ICD-10-CM

## 2020-06-23 DIAGNOSIS — J189 Pneumonia, unspecified organism: Secondary | ICD-10-CM | POA: Diagnosis not present

## 2020-06-23 DIAGNOSIS — J9601 Acute respiratory failure with hypoxia: Secondary | ICD-10-CM

## 2020-06-23 DIAGNOSIS — I4891 Unspecified atrial fibrillation: Secondary | ICD-10-CM | POA: Diagnosis not present

## 2020-06-23 DIAGNOSIS — E785 Hyperlipidemia, unspecified: Secondary | ICD-10-CM | POA: Diagnosis not present

## 2020-06-23 LAB — CBC WITH DIFFERENTIAL/PLATELET
Abs Immature Granulocytes: 0.02 10*3/uL (ref 0.00–0.07)
Basophils Absolute: 0 10*3/uL (ref 0.0–0.1)
Basophils Relative: 0 %
Eosinophils Absolute: 0 10*3/uL (ref 0.0–0.5)
Eosinophils Relative: 0 %
HCT: 40.6 % (ref 39.0–52.0)
Hemoglobin: 13.1 g/dL (ref 13.0–17.0)
Immature Granulocytes: 0 %
Lymphocytes Relative: 5 %
Lymphs Abs: 0.5 10*3/uL — ABNORMAL LOW (ref 0.7–4.0)
MCH: 31 pg (ref 26.0–34.0)
MCHC: 32.3 g/dL (ref 30.0–36.0)
MCV: 96 fL (ref 80.0–100.0)
Monocytes Absolute: 1 10*3/uL (ref 0.1–1.0)
Monocytes Relative: 9 %
Neutro Abs: 9.4 10*3/uL — ABNORMAL HIGH (ref 1.7–7.7)
Neutrophils Relative %: 86 %
Platelets: 182 10*3/uL (ref 150–400)
RBC: 4.23 MIL/uL (ref 4.22–5.81)
RDW: 14.1 % (ref 11.5–15.5)
WBC: 11 10*3/uL — ABNORMAL HIGH (ref 4.0–10.5)
nRBC: 0 % (ref 0.0–0.2)

## 2020-06-23 LAB — BASIC METABOLIC PANEL
Anion gap: 11 (ref 5–15)
BUN: 14 mg/dL (ref 8–23)
CO2: 31 mmol/L (ref 22–32)
Calcium: 8.9 mg/dL (ref 8.9–10.3)
Chloride: 101 mmol/L (ref 98–111)
Creatinine, Ser: 1.04 mg/dL (ref 0.61–1.24)
GFR, Estimated: >60 mL/min (ref >60)
Glucose, Bld: 136 mg/dL — ABNORMAL HIGH (ref 70–99)
Potassium: 3.8 mmol/L (ref 3.5–5.1)
Sodium: 143 mmol/L (ref 135–145)

## 2020-06-23 MED ORDER — HALOPERIDOL LACTATE 5 MG/ML IJ SOLN
1.0000 mg | Freq: Four times a day (QID) | INTRAMUSCULAR | Status: DC | PRN
  Administered 2020-06-24: 2 mg via INTRAVENOUS
  Filled 2020-06-23: qty 1

## 2020-06-23 MED ORDER — METOPROLOL TARTRATE 25 MG PO TABS
25.0000 mg | ORAL_TABLET | Freq: Two times a day (BID) | ORAL | Status: DC
  Administered 2020-06-23 – 2020-06-24 (×2): 25 mg via ORAL
  Filled 2020-06-23 (×2): qty 1

## 2020-06-23 MED ORDER — FUROSEMIDE 10 MG/ML IJ SOLN
40.0000 mg | Freq: Once | INTRAMUSCULAR | Status: AC
  Administered 2020-06-23: 40 mg via INTRAVENOUS
  Filled 2020-06-23: qty 4

## 2020-06-23 MED ORDER — QUETIAPINE FUMARATE 50 MG PO TABS
50.0000 mg | ORAL_TABLET | Freq: Two times a day (BID) | ORAL | Status: DC
Start: 2020-06-23 — End: 2020-06-24
  Administered 2020-06-23 – 2020-06-24 (×2): 50 mg via ORAL
  Filled 2020-06-23 (×2): qty 1

## 2020-06-23 NOTE — Progress Notes (Addendum)
[facility] PROGRESS NOTE   Donald Rasmussen PPI:[id number] DOB: 1932-11-23 DOA: 06/28/2020  PCP: Lajean Manes, MD  Brief History/Interval Summary: 87 y.o. male with medical history significant of HTN; HLD; dementia; CAD s/p stent; remote ETOH dependence (quit about 1 year ago); ongoing oral tobacco dependence; chronic low back pain; stage 3a CKD; prostate CA; and afib on Eliquis presenting with SOB. Patient is alone during the day and has family members who bring meals and then has a nighttime caregiver.  He has baseline dementia with mood disturbance.  He wanders at baseline.  Mostly independent with ADLs - occasional urge incontinence, needs help with showering.  Patient was hospitalized and started on antibiotics for pneumonia.  Consultants: Cardiology was consulted due to elevated troponin  Procedures: None yet  Antibiotics: Anti-infectives (From admission, onward)   Start     Dose/Rate Route Frequency Ordered Stop   06/23/20 1500  cefTRIAXone (ROCEPHIN) 2 g in sodium chloride 0.9 % 100 mL IVPB        2 g 200 mL/hr over 30 Minutes Intravenous Every 24 hours 06/14/2020 1639     06/23/20 1200  azithromycin (ZITHROMAX) 500 mg in sodium chloride 0.9 % 250 mL IVPB        500 mg 250 mL/hr over 60 Minutes Intravenous Every 24 hours 06/23/2020 1639     06/17/2020 1445  cefTRIAXone (ROCEPHIN) 1 g in sodium chloride 0.9 % 100 mL IVPB        1 g 200 mL/hr over 30 Minutes Intravenous  Once 06/02/2020 1435 06/07/2020 1529   06/14/2020 1445  azithromycin (ZITHROMAX) 500 mg in sodium chloride 0.9 % 250 mL IVPB        500 mg 250 mL/hr over 60 Minutes Intravenous  Once 06/08/2020 1435 06/05/2020 1641      Subjective/Interval History: Patient is pleasantly confused.  Not able to answer questions appropriately.    Assessment/Plan:  Multifocal pneumonia/acute respiratory failure with hypoxia Patient presented with hypoxia with saturations to 82%.  Chest x-ray showed multifocal pneumonia.  He  underwent CT angiogram which ruled out PE.  Concern for aspiration.  Patient does have advanced dementia.  COVID-19 test was negative.  Cultures are pending.  Patient started on ceftriaxone and azithromycin.  Remains on about 6 L of oxygen by nasal cannula.  Continue to monitor closely.  Wean down oxygen as tolerated.  Maintain saturations greater than 90%.  Dysphagia Speech therapy consult requested for swallow evaluation.  Essential hypertension Home medications being continued including metoprolol, quinapril.  Spironolactone is on hold.  History of dementia Has behavioral disturbances at baseline.  Apparently still able to live by himself.  Family checks on him periodically.  PT and OT evaluation.  History of coronary artery disease Mildly elevated troponin levels were noted.  Cardiology was consulted.  Patient does not appear to have any chest pain.  EKG was noted to be abnormal.  No inpatient testing being planned by cardiology at this time.  Likely had demand ischemia.  History of paroxysmal atrial fibrillation Continue Eliquis  Acute on chronic diastolic CHF Was on furosemide and spironolactone at home.  Per cardiology note his weight has gone up slightly.  They have started him on furosemide today.  Stasis dermatitis with ulceration Wound care.  Chronic disease stage IIIa Renal function close to baseline.  Monitor urine output.  History of gout Continue allopurinol  Hyperlipidemia Continue Lipitor  History of prostate cancer Appears to be remote  History of tobacco dependence Nicotine patch  DVT Prophylaxis: On Eliquis Code Status: DNR Family Communication: Grandson updated. Patient's another grandson stays with him for some part of the day. Disposition Plan: PT and OT evaluation.  Hopefully return home in improved  Status is: Inpatient  Remains inpatient appropriate because:Altered mental status, IV treatments appropriate due to intensity of illness or  inability to take PO and Inpatient level of care appropriate due to severity of illness   Dispo: The patient is from: Home              Anticipated d/c is to: Home              Anticipated d/c date is: 3 days              Patient currently is not medically stable to d/c.   Difficult to place patient No     Medications:  Scheduled: . allopurinol  300 mg Oral Daily  . apixaban  5 mg Oral BID  . atorvastatin  20 mg Oral Daily  . furosemide  40 mg Intravenous Once  . furosemide  40 mg Oral Daily  . metoprolol tartrate  25 mg Oral BID  . mirtazapine  15 mg Oral QHS  . nicotine  14 mg Transdermal Daily  . potassium chloride  40 mEq Oral BID  . QUEtiapine  25 mg Oral QHS  . quinapril  20 mg Oral Daily   Continuous: . sodium chloride 75 mL/hr at 06/21/2020 2134  . azithromycin 500 mg (06/23/20 1025)  . cefTRIAXone (ROCEPHIN)  IV     WPY:[id number] **AND** benztropine, melatonin   Objective:  Vital Signs  Vitals:   06/06/2020 2010 06/21/2020 2049 06/06/2020 2053 06/23/20 0850  BP: (!) 167/93  (!) 161/82 (!) 161/78  Pulse: 86  74 87  Resp: (!) 23  18 19   Temp: 97.7 F (36.5 C)  98 F (36.7 C) 98.7 F (37.1 C)  TempSrc: Oral  Oral   SpO2: 93%  100% 98%  Weight:  91.3 kg    Height:        Intake/Output Summary (Last 24 hours) at 06/23/2020 1117 Last data filed at 06/23/2020 0430 Gross per 24 hour  Intake -  Output 800 ml  Net -800 ml   Filed Weights   06/21/2020 1216 06/10/2020 2049  Weight: 90 kg 91.3 kg    General appearance: Awake alert.  In no distress.  Pleasantly confused Resp: Mildly tachypneic.  Crackles bilateral bases right more than left.  No wheezing or rhonchi.  No use of accessory muscles. Cardio: S1-S2 is normal regular.  No S3-S4.  No rubs murmurs or bruit GI: Abdomen is soft.  Nontender nondistended.  Bowel sounds are present normal.  No masses organomegaly Extremities: Mild edema noted bilateral lower extremities Neurologic:   No focal neurological  deficits.    Lab Results:  Data Reviewed: I have personally reviewed following labs and imaging studies  CBC: Recent Labs  Lab 06/17/2020 1248 06/23/20 0241  WBC 13.2* 11.0*  NEUTROABS  --  9.4*  HGB 13.1 13.1  HCT 41.5 40.6  MCV 98.1 96.0  PLT 210 397    Basic Metabolic Panel: Recent Labs  Lab 06/26/2020 1248 06/23/20 0241  NA 144 143  K 3.3* 3.8  CL 101 101  CO2 32 31  GLUCOSE 182* 136*  BUN 12 14  CREATININE 1.16 1.04  CALCIUM 9.0 8.9    GFR: Estimated Creatinine Clearance: 58.7 mL/min (by C-G formula based on SCr of 1.04 mg/dL).  Recent Results (from the past 240 hour(s))  Resp Panel by RT-PCR (Flu A&B, Covid) Nasopharyngeal Swab     Status: None   Collection Time: 06/08/2020 12:48 PM   Specimen: Nasopharyngeal Swab; Nasopharyngeal(NP) swabs in vial transport medium  Result Value Ref Range Status   SARS Coronavirus 2 by RT PCR NEGATIVE NEGATIVE Final    Comment: (NOTE) SARS-CoV-2 target nucleic acids are NOT DETECTED.  The SARS-CoV-2 RNA is generally detectable in upper respiratory specimens during the acute phase of infection. The lowest concentration of SARS-CoV-2 viral copies this assay can detect is 138 copies/mL. A negative result does not preclude SARS-Cov-2 infection and should not be used as the sole basis for treatment or other patient management decisions. A negative result may occur with  improper specimen collection/handling, submission of specimen other than nasopharyngeal swab, presence of viral mutation(s) within the areas targeted by this assay, and inadequate number of viral copies(<138 copies/mL). A negative result must be combined with clinical observations, patient history, and epidemiological information. The expected result is Negative.  Fact Sheet for Patients:  EntrepreneurPulse.com.au  Fact Sheet for Healthcare Providers:  IncredibleEmployment.be  This test is no t yet approved or cleared  by the [place] FDA and  has been authorized for detection and/or diagnosis of SARS-CoV-2 by FDA under an Emergency Use Authorization (EUA). This EUA will remain  in effect (meaning this test can be used) for the duration of the COVID-19 declaration under Section 564(b)(1) of the Act, 21 U.S.C.section 360bbb-3(b)(1), unless the authorization is terminated  or revoked sooner.       Influenza A by PCR NEGATIVE NEGATIVE Final   Influenza B by PCR NEGATIVE NEGATIVE Final    Comment: (NOTE) The Xpert Xpress SARS-CoV-2/FLU/RSV plus assay is intended as an aid in the diagnosis of influenza from Nasopharyngeal swab specimens and should not be used as a sole basis for treatment. Nasal washings and aspirates are unacceptable for Xpert Xpress SARS-CoV-2/FLU/RSV testing.  Fact Sheet for Patients: EntrepreneurPulse.com.au  Fact Sheet for Healthcare Providers: IncredibleEmployment.be  This test is not yet approved or cleared by the [place] FDA and has been authorized for detection and/or diagnosis of SARS-CoV-2 by FDA under an Emergency Use Authorization (EUA). This EUA will remain in effect (meaning this test can be used) for the duration of the COVID-19 declaration under Section 564(b)(1) of the Act, 21 U.S.C. section 360bbb-3(b)(1), unless the authorization is terminated or revoked.  Performed at [facility], [place] [address] [place], [place] [address]   Culture, blood (routine x 2) Call MD if unable to obtain prior to antibiotics being given     Status: None (Preliminary result)   Collection Time: 06/03/2020  5:01 PM   Specimen: BLOOD  Result Value Ref Range Status   Specimen Description BLOOD BLOOD RIGHT FOREARM  Final   Special Requests   Final    BOTTLES DRAWN AEROBIC AND ANAEROBIC Blood Culture results may not be optimal due to an inadequate volume of blood received in culture bottles   Culture   Final    NO GROWTH < 12  HOURS Performed at [facility] [address] [place], [place] [address]    Report Status PENDING  Incomplete  Culture, blood (routine x 2) Call MD if unable to obtain prior to antibiotics being given     Status: None (Preliminary result)   Collection Time: 06/08/2020  6:10 PM   Specimen: BLOOD RIGHT HAND  Result Value Ref Range Status  Specimen Description BLOOD RIGHT HAND  Final   Special Requests   Final    BOTTLES DRAWN AEROBIC AND ANAEROBIC Blood Culture results may not be optimal due to an inadequate volume of blood received in culture bottles   Culture   Final    NO GROWTH < 12 HOURS Performed at [facility] [address], [place], [place] [address]    Report Status PENDING  Incomplete      Radiology Studies: CT Angio Chest PE W/Cm &/Or Wo Cm  Result Date: 06/13/2020 CLINICAL DATA:  PE suspected, shortness of breath EXAM: CT ANGIOGRAPHY CHEST WITH CONTRAST TECHNIQUE: Multidetector CT imaging of the chest was performed using the standard protocol during bolus administration of intravenous contrast. Multiplanar CT image reconstructions and MIPs were obtained to evaluate the vascular anatomy. CONTRAST:  75mL OMNIPAQUE IOHEXOL 350 MG/ML SOLN COMPARISON:  06/06/2016 FINDINGS: Cardiovascular: Satisfactory opacification of the pulmonary arteries to the segmental level. No evidence of pulmonary embolism. Cardiomegaly. Extensive 3 vessel coronary artery calcifications and/or stents. No pericardial effusion. Enlargement of the main pulmonary artery, measuring up to 3.7 cm in caliber. Aortic atherosclerosis. Mediastinum/Nodes: Enlarged pretracheal and bilateral hilar lymph nodes, largest pretracheal node measuring up to 2.0 x 2.0 cm (series 5, image 69). Thyroid gland, trachea, and esophagus demonstrate no significant findings. Lungs/Pleura: Mild centrilobular emphysema. Diffuse bilateral bronchial wall thickening. Extensive, predominantly dependent bibasilar heterogeneous airspace  opacity, most notable in the right lower lobe (series 6, image 98). Small bilateral pleural effusions. Upper Abdomen: No acute abnormality. Musculoskeletal: No chest wall abnormality. No acute or significant osseous findings. Review of the MIP images confirms the above findings. IMPRESSION: 1.  Negative examination for pulmonary embolism. 2. Diffuse bilateral bronchial wall thickening and extensive, predominantly dependent bibasilar heterogeneous airspace opacity with small associated pleural effusions. Findings are consistent with infection or aspiration. 3.  Mild emphysema. 4.  Enlarged mediastinal lymph nodes, likely reactive. 5.  Cardiomegaly and coronary artery disease. 6. Enlargement of the main pulmonary artery, as can be seen in pulmonary hypertension. Aortic Atherosclerosis (ICD10-I70.0). Electronically Signed   By: Eddie Candle M.D.   On: 06/23/2020 16:22   DG Chest Portable 1 View  Result Date: 06/28/2020 CLINICAL DATA:  87 year old male with history of worsening shortness of breath since yesterday. EXAM: PORTABLE CHEST 1 VIEW COMPARISON:  Chest x-ray 06/02/2018. FINDINGS: Lung volumes are low. Patchy multifocal airspace disease most evident throughout the right upper, right lower and left lower lungs. Relative sparing of the left upper lobe. No definite pleural effusions. Widespread areas of interstitial prominence. No evidence of pulmonary edema. No pneumothorax. Heart size is mildly enlarged. Fullness in the right hilar region. The patient is rotated to the right on today's exam, resulting in distortion of the mediastinal contours and reduced diagnostic sensitivity and specificity for mediastinal pathology. Aortic atherosclerosis. IMPRESSION: 1. The appearance of the chest suggests developing multilobar bilateral bronchopneumonia. Right-sided hilar fullness is noted. Close attention in this region on follow-up studies is recommended, as the possibility of underlying mass and/or lymphadenopathy is  not excluded. If there is strong clinical concern for malignancy, further evaluation with contrast enhanced chest CT could be considered at this time. 2. Mild cardiomegaly. 3. Aortic atherosclerosis. Electronically Signed   By: Vinnie Langton M.D.   On: 06/04/2020 13:13       LOS: 1 day   [facility] Pager on [email or web address]  06/23/2020, 11:17 AM

## 2020-06-23 NOTE — Discharge Instructions (Signed)

## 2020-06-23 NOTE — Progress Notes (Signed)
Progress Note  Patient Name: Donald Rasmussen Date of Encounter: 06/23/2020  [facility] Cardiologist: Sinclair Grooms, MD   Subjective   Feeling well.  Confused but no specific complaints.  Denies chest pain or shortness of breath.   Inpatient Medications    Scheduled Meds: . allopurinol  300 mg Oral Daily  . apixaban  5 mg Oral BID  . atorvastatin  20 mg Oral Daily  . furosemide  40 mg Oral Daily  . metoprolol tartrate  12.5 mg Oral BID  . mirtazapine  15 mg Oral QHS  . nicotine  14 mg Transdermal Daily  . potassium chloride  40 mEq Oral BID  . QUEtiapine  25 mg Oral QHS  . quinapril  20 mg Oral Daily   Continuous Infusions: . sodium chloride 75 mL/hr at 06/28/2020 2134  . azithromycin 500 mg (06/23/20 1025)  . cefTRIAXone (ROCEPHIN)  IV     PRN Meds: haloperidol **AND** benztropine, melatonin   Vital Signs    Vitals:   06/26/2020 2010 06/03/2020 2049 06/29/2020 2053 06/23/20 0850  BP: (!) 167/93  (!) 161/82 (!) 161/78  Pulse: 86  74 87  Resp: (!) 23  18 19   Temp: 97.7 F (36.5 C)  98 F (36.7 C) 98.7 F (37.1 C)  TempSrc: Oral  Oral   SpO2: 93%  100% 98%  Weight:  91.3 kg    Height:        Intake/Output Summary (Last 24 hours) at 06/23/2020 1032 Last data filed at 06/23/2020 0430 Gross per 24 hour  Intake -  Output 800 ml  Net -800 ml   Last 3 Weights 06/06/2020 06/23/2020 02/22/2020  Weight (lbs) 201 lb 4.5 oz 198 lb 6.6 oz 198 lb  Weight (kg) 91.3 kg 90 kg 89.812 kg      Telemetry    Atrial fibrillation.  PVCs.  Rate 80s-150s - Personally Reviewed  ECG     Atrial fibrillation.  Rate 72 bpm.  LAFB.  LVH with secondary repolarization abnormality.  - Personally Reviewed  Physical Exam   VS:  BP (!) 161/78   Pulse 87   Temp 98.7 F (37.1 C)   Resp 19   Ht 6\' 1"  (1.854 m)   Wt 91.3 kg   SpO2 98%   BMI 26.56 kg/m  , BMI Body mass index is 26.56 kg/m. GENERAL:  Well appearing.  No acute distress. HEENT: Pupils equal round and reactive, fundi  not visualized, oral mucosa unremarkable NECK:  No jugular venous distention, waveform within normal limits, carotid upstroke brisk and symmetric, no bruits LUNGS:  Bilateral rhonchi.  No crackles or wheezes. HEART:  Irregularly irregular.  PMI not displaced or sustained,S1 and S2 within normal limits, no S3, no S4, no clicks, no rubs, II/VI systolic murmur at LUSB.  ABD:  Flat, positive bowel sounds normal in frequency in pitch, no bruits, no rebound, no guarding, no midline pulsatile mass, no hepatomegaly, no splenomegaly EXT:  2 plus pulses throughout, no edema, no cyanosis no clubbing SKIN:  No rashes no nodules.  Mitten restraints in place. NEURO:  Cranial nerves II through XII grossly intact, motor grossly intact throughout PSYCH:  Confused.  Oriented to self   Labs    High Sensitivity Troponin:   Recent Labs  Lab 06/10/2020 1257 06/29/2020 1454  TROPONINIHS 75* 119*      Chemistry Recent Labs  Lab 06/02/2020 1248 06/23/20 0241  NA 144 143  K 3.3* 3.8  CL 101  101  CO2 32 31  GLUCOSE 182* 136*  BUN 12 14  CREATININE 1.16 1.04  CALCIUM 9.0 8.9  GFRNONAA >60 >60  ANIONGAP 11 11     Hematology Recent Labs  Lab 06/23/2020 1248 06/23/20 0241  WBC 13.2* 11.0*  RBC 4.23 4.23  HGB 13.1 13.1  HCT 41.5 40.6  MCV 98.1 96.0  MCH 31.0 31.0  MCHC 31.6 32.3  RDW 14.1 14.1  PLT 210 182    BNP Recent Labs  Lab 06/14/2020 1257  BNP 1,124.3*     DDimer No results for input(s): DDIMER in the last 168 hours.   Radiology    CT Angio Chest PE W/Cm &/Or Wo Cm  Result Date: 06/18/2020 CLINICAL DATA:  PE suspected, shortness of breath EXAM: CT ANGIOGRAPHY CHEST WITH CONTRAST TECHNIQUE: Multidetector CT imaging of the chest was performed using the standard protocol during bolus administration of intravenous contrast. Multiplanar CT image reconstructions and MIPs were obtained to evaluate the vascular anatomy. CONTRAST:  41mL OMNIPAQUE IOHEXOL 350 MG/ML SOLN COMPARISON:  06/06/2016  FINDINGS: Cardiovascular: Satisfactory opacification of the pulmonary arteries to the segmental level. No evidence of pulmonary embolism. Cardiomegaly. Extensive 3 vessel coronary artery calcifications and/or stents. No pericardial effusion. Enlargement of the main pulmonary artery, measuring up to 3.7 cm in caliber. Aortic atherosclerosis. Mediastinum/Nodes: Enlarged pretracheal and bilateral hilar lymph nodes, largest pretracheal node measuring up to 2.0 x 2.0 cm (series 5, image 69). Thyroid gland, trachea, and esophagus demonstrate no significant findings. Lungs/Pleura: Mild centrilobular emphysema. Diffuse bilateral bronchial wall thickening. Extensive, predominantly dependent bibasilar heterogeneous airspace opacity, most notable in the right lower lobe (series 6, image 98). Small bilateral pleural effusions. Upper Abdomen: No acute abnormality. Musculoskeletal: No chest wall abnormality. No acute or significant osseous findings. Review of the MIP images confirms the above findings. IMPRESSION: 1.  Negative examination for pulmonary embolism. 2. Diffuse bilateral bronchial wall thickening and extensive, predominantly dependent bibasilar heterogeneous airspace opacity with small associated pleural effusions. Findings are consistent with infection or aspiration. 3.  Mild emphysema. 4.  Enlarged mediastinal lymph nodes, likely reactive. 5.  Cardiomegaly and coronary artery disease. 6. Enlargement of the main pulmonary artery, as can be seen in pulmonary hypertension. Aortic Atherosclerosis (ICD10-I70.0). Electronically Signed   By: Eddie Candle M.D.   On: 06/13/2020 16:22   DG Chest Portable 1 View  Result Date: 06/09/2020 CLINICAL DATA:  87 year old male with history of worsening shortness of breath since yesterday. EXAM: PORTABLE CHEST 1 VIEW COMPARISON:  Chest x-ray 06/02/2018. FINDINGS: Lung volumes are low. Patchy multifocal airspace disease most evident throughout the right upper, right lower and left  lower lungs. Relative sparing of the left upper lobe. No definite pleural effusions. Widespread areas of interstitial prominence. No evidence of pulmonary edema. No pneumothorax. Heart size is mildly enlarged. Fullness in the right hilar region. The patient is rotated to the right on today's exam, resulting in distortion of the mediastinal contours and reduced diagnostic sensitivity and specificity for mediastinal pathology. Aortic atherosclerosis. IMPRESSION: 1. The appearance of the chest suggests developing multilobar bilateral bronchopneumonia. Right-sided hilar fullness is noted. Close attention in this region on follow-up studies is recommended, as the possibility of underlying mass and/or lymphadenopathy is not excluded. If there is strong clinical concern for malignancy, further evaluation with contrast enhanced chest CT could be considered at this time. 2. Mild cardiomegaly. 3. Aortic atherosclerosis. Electronically Signed   By: Vinnie Langton M.D.   On: 06/23/2020 13:13    Cardiac Studies  Echo 05/31/19: 1. LV hypertrophy, with LV muscle appearance consistent with possible  amyloid. While diastology cannot be fully evaluated due to afib, enlarged  LA and low tissue doppler velocities consistent with possible infiltrative  process. Recommend PYP scan if  clinical concern for amyloid exists.  2. Prominent IAS bowing to right with PFO with left to right flow. Bubble  study negative for right to left shunting.  3. Left ventricular ejection fraction, by visual estimation, is 60 to  65%. The left ventricle has normal function. There is moderately increased  left ventricular hypertrophy.  4. Left ventricular diastolic function could not be evaluated.  5. The left ventricle has no regional wall motion abnormalities.  6. Global right ventricle has normal systolic function.The right  ventricular size is mildly enlarged. No increase in right ventricular wall  thickness.  7. Left atrial  size was severely dilated.  8. Right atrial size was severely dilated.  9. Trivial pericardial effusion is present.  10. Moderate mitral annular calcification.  11. The mitral valve is degenerative. Mild mitral valve regurgitation.  12. The tricuspid valve is normal in structure.  13. The tricuspid valve is normal in structure. Tricuspid valve  regurgitation is moderate.  14. The aortic valve is tricuspid. Aortic valve regurgitation is mild. No  evidence of aortic valve sclerosis or stenosis.  15. Pulmonic regurgitation is mild.  16. The pulmonic valve was normal in structure. Pulmonic valve  regurgitation is mild.  17. Aortic dilatation noted.  18. There is mild dilatation of the ascending aorta measuring 39 mm.  19. Moderately elevated pulmonary artery systolic pressure.  20. The inferior vena cava is dilated in size with <50% respiratory  variability, suggesting right atrial pressure of 15 mmHg.  21. Moderately sized patent foramen ovale with predominantly left to right  shunting across the atrial septum.  22. Evidence of atrial level shunting detected by color flow Doppler.  23. There is right bowing of the interatrial septum, suggestive of  elevated left atrial pressure.   Patient Profile     87 y.o. male with a hx of CAD status post PCI, hypertension, hyperlipidemia tobacco abuse, dementia, CKD 3, prostate cancer, paroxysmal atrial fibrillation and prior alcohol abuse admitted with CAP.  Cardiology consulted for elevated troponin.   Assessment & Plan    # Elevated troponin: # Hyperlipidemia: Likely related to demand ischemia in the setting of CAP.  He has no chest pain.  EKG is stable with LVH with secondary repolarization abnormality.  No plan for ischemia evaluation, especially given his advanced dementia.  Continue metoprolol and atorvastatin.  # Acute on chronic diastolic heart failure: # Hypertension:  Prior echo concerning for amyloidosis.  Further work up declined  by family.  He has LE edema that is at his baseline.  Weight was 90 kg on admission and stable from outpatient visit 01/2020.  Today he is up 1kg.  Will diurese with IV lasix today.   Continue quinapril and metoprolol.  He takes spironolactone 12.5mg  qMWF.  # Persistent atrial fibrillation: # PVCs;  Heart rate intermittently elevated.  Continue Eliquis and increase metoprolol to 25mg  bid.  Supplement potassium.      For questions or updates, please contact [place] Please consult [email or web address] for contact info under        Signed, Skeet Latch, MD  06/23/2020, 10:32 AM

## 2020-06-23 NOTE — Consult Note (Signed)
[place] Nurse Consult Note: Patient receiving care in [facility] 2W37 Reason for Consult: Stasis dermatitis with ulcers Wound type:  BLE stasis ulcerations with mild R and L stasis dermatitis; R knee abrasion from apparent fall Pressure Injury POA: NA Measurement: Right knee abrasion measures 2 cm x 2 cm with crusting over 75% and red moist tissue over 25% Right shin 100% pink dry measures 1.8 cm x 2 cm Left shin 100% pink dry measures 2 cm x 1.4 cm Periwound: dry flaky Dressing procedure/placement/frequency: Wash both legs and feet with soap and water, rinse and pat dry. Place a small piece of Xeroform gauze over the knee wound and both shin wounds and secure with a foam dressing. Apply Sween moisturizing cream to both feet and dry areas on the legs. Change Xeroform gauze daily and apply moisturizing cream after daily bath.   Monitor the wound area(s) for worsening of condition such as: Signs/symptoms of infection, increase in size, development of or worsening of odor, development of pain, or increased pain at the affected locations.   Notify the medical team if any of these develop.  Thank you for the consult. [place] nurse will not follow at this time.   Please re-consult the [place] team if needed.  Cathlean Marseilles Tamala Julian, MSN, RN, [place], Lysle Pearl, [facility] Wound Treatment Associate Pager [phone]

## 2020-06-23 NOTE — Evaluation (Signed)
Clinical/Bedside Swallow Evaluation Patient Details  Name: Donald Rasmussen MRN: [id number] Date of Birth: [birth year]  Today's Date: 06/23/2020 Time: SLP Start Time (ACUTE ONLY): 1324 SLP Stop Time (ACUTE ONLY): 0906 SLP Time Calculation (min) (ACUTE ONLY): 17 min  Past Medical History:  Past Medical History:  Diagnosis Date  . Atrial fibrillation ([place]) 03/07/2012   Unknown duration but associated with CHF   . CAD (coronary artery disease), native coronary artery 03/07/2012   Prior MI with BMS RCA   . Cancer [facility])    prostate; Dr. Amalia Hailey  . Chronic kidney disease    Stg III kidney disease; GRF 55  . Chronic low back pain    2000 lumbosacral spine degenerative change. Possible calculus  . Complication of anesthesia   . Dementia with behavioral disturbance ([place]) 06/03/2020  . Dyslipidemia 06/19/2020  . Edentulous   . Essential hypertension 03/07/2012  . Gout   . History of amiodarone therapy 08/12/2013  . Hyperlipidemia   . Impaired fasting blood sugar   . Long term current use of anticoagulant therapy 08/12/2013  . Mucous cyst of tonsil   . Stasis dermatitis of both legs 06/13/2020  . Tobacco dependence 06/13/2020   Past Surgical History:  Past Surgical History:  Procedure Laterality Date  . CARDIAC CATHETERIZATION     stent   . CARDIOVERSION  03/07/2012   Procedure: CARDIOVERSION;  Surgeon: Sinclair Grooms, MD;  Location: [place];  Service: Cardiovascular;  Laterality: N/A;  . HEMORRHOID SURGERY    . RETROPUBIC PROSTATECTOMY  1997  . sebaceous cyst back     HPI:  Pt is an 87 yo male presenting with worsening AMS, SOB, and hypoxia. CXR suggests multifocal PNA with concern for aspiration on CTA. MD note indicates that pt falls asleep with food/chewing tobacco in his mouth and has some difficulty swallowing pills. PMH includes: dementia, CAD, HTN, HLD, atrial fibrillation, CKD   Assessment / Plan / Recommendation Clinical Impression  Pt's mentation appears to impact his  swallowing at the moment, with reduced awareness of what is in his mouth. He does not bite or chew on a piece of graham cracker, sucking on it and then letting it hang out of his mouth. He has some oral holding with other consistencies, but this is typically cleared spontaneously. Pt needs Mod cues to not talk while he has a bolus in his mouth, and his speech is also somewhat mumbled. Despite the signs of dysphagia observed, he had only a single cough across trials. Considering the above, will begin with Dys 1 diet and thin liquids. SLP Visit Diagnosis: Dysphagia, unspecified (R13.10)    Aspiration Risk  Mild aspiration risk    Diet Recommendation Dysphagia 1 (Puree);Thin liquid   Liquid Administration via: Cup;Straw Medication Administration: Crushed with puree Supervision: Staff to assist with self feeding;Full supervision/cueing for compensatory strategies Compensations: Slow rate;Small sips/bites Postural Changes: Seated upright at 90 degrees    Other  Recommendations Oral Care Recommendations: Oral care BID   Follow up Recommendations 24 hour supervision/assistance      Frequency and Duration min 2x/week  2 weeks       Prognosis Prognosis for Safe Diet Advancement: Good Barriers to Reach Goals: Cognitive deficits      Swallow Study   General HPI: Pt is an 86 yo male presenting with worsening AMS, SOB, and hypoxia. CXR suggests multifocal PNA with concern for aspiration on CTA. MD note indicates that pt falls asleep with food/chewing tobacco in his mouth and  has some difficulty swallowing pills. PMH includes: dementia, CAD, HTN, HLD, atrial fibrillation, CKD Type of Study: Bedside Swallow Evaluation Previous Swallow Assessment: none in chart Diet Prior to this Study: NPO Temperature Spikes Noted: No Respiratory Status: Nasal cannula History of Recent Intubation: No Behavior/Cognition: Alert;Cooperative;Pleasant mood;Confused;Distractible Oral Cavity Assessment: Within  Functional Limits Oral Care Completed by SLP: No Oral Cavity - Dentition: Edentulous Vision: Functional for self-feeding Self-Feeding Abilities: Needs assist Patient Positioning: Upright in bed Baseline Vocal Quality: Normal Volitional Swallow: Unable to elicit    Oral/Motor/Sensory Function Overall Oral Motor/Sensory Function: Within functional limits (as can be assessed given difficulty following commands)   Ice Chips Ice chips: Not tested   Thin Liquid Thin Liquid: Impaired Presentation: Cup;Spoon;Straw Oral Phase Functional Implications: Oral holding Pharyngeal  Phase Impairments: Cough - Immediate    Nectar Thick Nectar Thick Liquid: Not tested   Honey Thick Honey Thick Liquid: Not tested   Puree Puree: Impaired Oral Phase Functional Implications: Oral holding   Solid     Solid: Impaired Oral Phase Impairments: Impaired mastication;Poor awareness of bolus      Osie Bond., M.A. [place] Pager [phone] Office [phone]  06/23/2020,9:31 AM

## 2020-06-24 DIAGNOSIS — J189 Pneumonia, unspecified organism: Secondary | ICD-10-CM

## 2020-06-24 DIAGNOSIS — J690 Pneumonitis due to inhalation of food and vomit: Secondary | ICD-10-CM

## 2020-06-24 LAB — BASIC METABOLIC PANEL
Anion gap: 13 (ref 5–15)
BUN: 18 mg/dL (ref 8–23)
CO2: 27 mmol/L (ref 22–32)
Calcium: 8.7 mg/dL — ABNORMAL LOW (ref 8.9–10.3)
Chloride: 101 mmol/L (ref 98–111)
Creatinine, Ser: 1.05 mg/dL (ref 0.61–1.24)
GFR, Estimated: >60 mL/min (ref >60)
Glucose, Bld: 125 mg/dL — ABNORMAL HIGH (ref 70–99)
Potassium: 3.8 mmol/L (ref 3.5–5.1)
Sodium: 141 mmol/L (ref 135–145)

## 2020-06-24 LAB — CBC
HCT: 38.2 % — ABNORMAL LOW (ref 39.0–52.0)
Hemoglobin: 12.6 g/dL — ABNORMAL LOW (ref 13.0–17.0)
MCH: 31.3 pg (ref 26.0–34.0)
MCHC: 33 g/dL (ref 30.0–36.0)
MCV: 95 fL (ref 80.0–100.0)
Platelets: 162 10*3/uL (ref 150–400)
RBC: 4.02 MIL/uL — ABNORMAL LOW (ref 4.22–5.81)
RDW: 14 % (ref 11.5–15.5)
WBC: 10.3 10*3/uL (ref 4.0–10.5)
nRBC: 0 % (ref 0.0–0.2)

## 2020-06-24 LAB — MAGNESIUM: Magnesium: 2.1 mg/dL (ref 1.7–2.4)

## 2020-06-27 LAB — CULTURE, BLOOD (ROUTINE X 2)
Culture: NO GROWTH
Culture: NO GROWTH

## 2020-06-30 NOTE — Death Summary Note (Signed)
DEATH SUMMARY   Patient Details  Name: Donald Rasmussen MRN: [id number] DOB: 05-May-1932  Admission/Discharge Information   Admit Date:  2020-07-08  Date of Death: Date of Death: 2020/07/10  Time of Death: Time of Death: 1033-08-05  Length of Stay: 2  Referring Physician: Lajean Manes, MD   Reason(s) for Hospitalization  aspiration pneumonia and community acquired pneumonia  Diagnoses  Preliminary cause of death:  Secondary Diagnoses (including complications and co-morbidities):  Principal Problem:   Community acquired pneumonia Active Problems:   Atrial fibrillation ([place])   CAD (coronary artery disease), native coronary artery   Essential hypertension   Dysphagia   Dementia with behavioral disturbance ([place])   Stasis dermatitis of both legs   Dyslipidemia   Tobacco dependence   DNR (do not resuscitate)   Brief Hospital Course (including significant findings, care, treatment, and services provided and events leading to death)  Donald Rasmussen is Donald Rasmussen 87 y.o. year old male who has Donald Rasmussen medical history significant ofHTN; HLD; dementia; CAD s/p stent;remote ETOH dependence (quit about 1 year ago); ongoing oral tobacco dependence;chronic low back pain; stage 3a CKD; prostate CA; and afib on Eliquis presenting with SOB.Patient is alone during the day and has family members who bring meals and then has Donald Rasmussen nighttime caregiver. He has baseline dementia with mood disturbance. He wanders at baseline. Mostly independent with ADLs - occasional urge incontinence, needs help with showering.  Patient was hospitalized and started on antibiotics for pneumonia.  He had hypoxia to the 80's on presentation.  His CT scan was concerning for aspiration.  Plan was for antibiotics, speech evaluation, and supportive care.  Unfortunately, this morning he was found pulseless by nursing.  Suspect he had progressive aspiration event.  I called his grandson to discuss events and answer any questions and express condolences.   I also spoke to the granddaughter at bedside, expressed condolences.  See previous notes for additional details.       Pertinent Labs and Studies  Significant Diagnostic Studies CT Angio Chest PE W/Cm &/Or Wo Cm  Result Date: 08-Jul-2020 CLINICAL DATA:  PE suspected, shortness of breath EXAM: CT ANGIOGRAPHY CHEST WITH CONTRAST TECHNIQUE: Multidetector CT imaging of the chest was performed using the standard protocol during bolus administration of intravenous contrast. Multiplanar CT image reconstructions and MIPs were obtained to evaluate the vascular anatomy. CONTRAST:  44mL OMNIPAQUE IOHEXOL 350 MG/ML SOLN COMPARISON:  06/06/2016 FINDINGS: Cardiovascular: Satisfactory opacification of the pulmonary arteries to the segmental level. No evidence of pulmonary embolism. Cardiomegaly. Extensive 3 vessel coronary artery calcifications and/or stents. No pericardial effusion. Enlargement of the main pulmonary artery, measuring up to 3.7 cm in caliber. Aortic atherosclerosis. Mediastinum/Nodes: Enlarged pretracheal and bilateral hilar lymph nodes, largest pretracheal node measuring up to 2.0 x 2.0 cm (series 5, image 69). Thyroid gland, trachea, and esophagus demonstrate no significant findings. Lungs/Pleura: Mild centrilobular emphysema. Diffuse bilateral bronchial wall thickening. Extensive, predominantly dependent bibasilar heterogeneous airspace opacity, most notable in the right lower lobe (series 6, image 98). Small bilateral pleural effusions. Upper Abdomen: No acute abnormality. Musculoskeletal: No chest wall abnormality. No acute or significant osseous findings. Review of the MIP images confirms the above findings. IMPRESSION: 1.  Negative examination for pulmonary embolism. 2. Diffuse bilateral bronchial wall thickening and extensive, predominantly dependent bibasilar heterogeneous airspace opacity with small associated pleural effusions. Findings are consistent with infection or aspiration. 3.  Mild  emphysema. 4.  Enlarged mediastinal lymph nodes, likely reactive. 5.  Cardiomegaly and coronary artery disease. 6.  Enlargement of the main pulmonary artery, as can be seen in pulmonary hypertension. Aortic Atherosclerosis (ICD10-I70.0). Electronically Signed   By: Eddie Candle M.D.   On: 06/29/2020 16:22   DG Chest Portable 1 View  Result Date: 06/20/2020 CLINICAL DATA:  87 year old male with history of worsening shortness of breath since yesterday. EXAM: PORTABLE CHEST 1 VIEW COMPARISON:  Chest x-ray 06/02/2018. FINDINGS: Lung volumes are low. Patchy multifocal airspace disease most evident throughout the right upper, right lower and left lower lungs. Relative sparing of the left upper lobe. No definite pleural effusions. Widespread areas of interstitial prominence. No evidence of pulmonary edema. No pneumothorax. Heart size is mildly enlarged. Fullness in the right hilar region. The patient is rotated to the right on today's exam, resulting in distortion of the mediastinal contours and reduced diagnostic sensitivity and specificity for mediastinal pathology. Aortic atherosclerosis. IMPRESSION: 1. The appearance of the chest suggests developing multilobar bilateral bronchopneumonia. Right-sided hilar fullness is noted. Close attention in this region on follow-up studies is recommended, as the possibility of underlying mass and/or lymphadenopathy is not excluded. If there is strong clinical concern for malignancy, further evaluation with contrast enhanced chest CT could be considered at this time. 2. Mild cardiomegaly. 3. Aortic atherosclerosis. Electronically Signed   By: Vinnie Langton M.D.   On: 06/25/2020 13:13    Microbiology Recent Results (from the past 240 hour(s))  Resp Panel by RT-PCR (Flu Donald Rasmussen&B, Covid) Nasopharyngeal Swab     Status: None   Collection Time: 06/09/2020 12:48 PM   Specimen: Nasopharyngeal Swab; Nasopharyngeal(NP) swabs in vial transport medium  Result Value Ref Range Status    SARS Coronavirus 2 by RT PCR NEGATIVE NEGATIVE Final    Comment: (NOTE) SARS-CoV-2 target nucleic acids are NOT DETECTED.  The SARS-CoV-2 RNA is generally detectable in upper respiratory specimens during the acute phase of infection. The lowest concentration of SARS-CoV-2 viral copies this assay can detect is 138 copies/mL. Izayah Miner negative result does not preclude SARS-Cov-2 infection and should not be used as the sole basis for treatment or other patient management decisions. Lavren Lewan negative result may occur with  improper specimen collection/handling, submission of specimen other than nasopharyngeal swab, presence of viral mutation(s) within the areas targeted by this assay, and inadequate number of viral copies(<138 copies/mL). Analuisa Tudor negative result must be combined with clinical observations, patient history, and epidemiological information. The expected result is Negative.  Fact Sheet for Patients:  EntrepreneurPulse.com.au  Fact Sheet for Healthcare Providers:  IncredibleEmployment.be  This test is no t yet approved or cleared by the [place] FDA and  has been authorized for detection and/or diagnosis of SARS-CoV-2 by FDA under an Emergency Use Authorization (EUA). This EUA will remain  in effect (meaning this test can be used) for the duration of the COVID-19 declaration under Section 564(b)(1) of the Act, 21 U.S.C.section 360bbb-3(b)(1), unless the authorization is terminated  or revoked sooner.       Influenza Marieme Mcmackin by PCR NEGATIVE NEGATIVE Final   Influenza B by PCR NEGATIVE NEGATIVE Final    Comment: (NOTE) The Xpert Xpress SARS-CoV-2/FLU/RSV plus assay is intended as an aid in the diagnosis of influenza from Nasopharyngeal swab specimens and should not be used as Carolle Ishii sole basis for treatment. Nasal washings and aspirates are unacceptable for Xpert Xpress SARS-CoV-2/FLU/RSV testing.  Fact Sheet for  Patients: EntrepreneurPulse.com.au  Fact Sheet for Healthcare Providers: IncredibleEmployment.be  This test is not yet approved or cleared by the [place] and has been authorized for  detection and/or diagnosis of SARS-CoV-2 by FDA under an Emergency Use Authorization (EUA). This EUA will remain in effect (meaning this test can be used) for the duration of the COVID-19 declaration under Section 564(b)(1) of the Act, 21 U.S.C. section 360bbb-3(b)(1), unless the authorization is terminated or revoked.  Performed at [facility], [place] [address] [place], [place] [address]   Culture, blood (routine x 2) Call MD if unable to obtain prior to antibiotics being given     Status: None (Preliminary result)   Collection Time: 06/12/2020  5:01 PM   Specimen: BLOOD  Result Value Ref Range Status   Specimen Description BLOOD BLOOD RIGHT FOREARM  Final   Special Requests   Final    BOTTLES DRAWN AEROBIC AND ANAEROBIC Blood Culture results may not be optimal due to an inadequate volume of blood received in culture bottles   Culture   Final    NO GROWTH 2 DAYS Performed at [facility], [place] [address], [place], [place] [address]    Report Status PENDING  Incomplete  Culture, blood (routine x 2) Call MD if unable to obtain prior to antibiotics being given     Status: None (Preliminary result)   Collection Time: 06/25/2020  6:10 PM   Specimen: BLOOD RIGHT HAND  Result Value Ref Range Status   Specimen Description BLOOD RIGHT HAND  Final   Special Requests   Final    BOTTLES DRAWN AEROBIC AND ANAEROBIC Blood Culture results may not be optimal due to an inadequate volume of blood received in culture bottles   Culture   Final    NO GROWTH 2 DAYS Performed at [facility], [place] [address], [place], [place] [address]    Report Status PENDING  Incomplete    Lab Basic Metabolic Panel: Recent Labs  Lab 06/10/2020 1248 06/23/20 0241  26-Jun-2020 0354  NA 144 143 141  K 3.3* 3.8 3.8  CL 101 101 101  CO2 32 31 27  GLUCOSE 182* 136* 125*  BUN 12 14 18   CREATININE 1.16 1.04 1.05  CALCIUM 9.0 8.9 8.7*  MG  --   --  2.1   Liver Function Tests: No results for input(s): AST, ALT, ALKPHOS, BILITOT, PROT, ALBUMIN in the last 168 hours. No results for input(s): LIPASE, AMYLASE in the last 168 hours. No results for input(s): AMMONIA in the last 168 hours. CBC: Recent Labs  Lab 06/17/2020 1248 06/23/20 0241 06-26-2020 0354  WBC 13.2* 11.0* 10.3  NEUTROABS  --  9.4*  --   HGB 13.1 13.1 12.6*  HCT 41.5 40.6 38.2*  MCV 98.1 96.0 95.0  PLT 210 182 162   Cardiac Enzymes: No results for input(s): CKTOTAL, CKMB, CKMBINDEX, TROPONINI in the last 168 hours. Sepsis Labs: Recent Labs  Lab 06/04/2020 1248 06/12/2020 1734 06/23/20 0241 Jun 26, 2020 0354  PROCALCITON  --  0.45  --   --   WBC 13.2*  --  11.0* 10.3    Procedures/Operations  See previous notes   Fayrene Helper June 26, 2020, 8:50 PM

## 2020-06-30 NOTE — Progress Notes (Incomplete)
Progress Note  Patient Name: Donald Rasmussen Date of Encounter: 2020/07/17  Primary Cardiologist: ***  Subjective   ***  Inpatient Medications    Scheduled Meds: . allopurinol  300 mg Oral Daily  . apixaban  5 mg Oral BID  . atorvastatin  20 mg Oral Daily  . furosemide  40 mg Oral Daily  . metoprolol tartrate  25 mg Oral BID  . mirtazapine  15 mg Oral QHS  . nicotine  14 mg Transdermal Daily  . potassium chloride  40 mEq Oral BID  . QUEtiapine  50 mg Oral BID  . quinapril  20 mg Oral Daily   Continuous Infusions: . sodium chloride 1 mL (07-17-2020 0405)  . azithromycin 500 mg (06/23/20 1025)  . cefTRIAXone (ROCEPHIN)  IV 2 g (06/23/20 1308)   PRN Meds: haloperidol lactate, melatonin   Vital Signs    Vitals:   06/23/20 1030 06/23/20 2046 07/17/20 0350 07-17-20 0900  BP:  (!) 156/71 (!) 166/88 (!) 170/90  Pulse:  82 78   Resp:  20 20   Temp:  97.9 F (36.6 C) 97.8 F (36.6 C)   TempSrc:  Axillary Axillary   SpO2: 99% 99% 100%   Weight:      Height:        Intake/Output Summary (Last 24 hours) at July 17, 2020 1059 Last data filed at 07-17-2020 0354 Gross per 24 hour  Intake 480 ml  Output 5050 ml  Net -4570 ml   Filed Weights   06/28/2020 1216 06/19/2020 2049  Weight: 90 kg 91.3 kg    Physical Exam   GEN: Well nourished, well developed, in no acute distress.  HEENT: Grossly normal.  Neck: Supple, no JVD, carotid bruits, or masses. Cardiac: RRR, no murmurs, rubs, or gallops. No clubbing, cyanosis, edema.  Radials/DP/PT 2+ and equal bilaterally.  Respiratory:  Respirations regular and unlabored, clear to auscultation bilaterally. GI: Soft, nontender, nondistended, BS + x 4. MS: no deformity or atrophy. Skin: warm and dry, no rash. Neuro:  Strength and sensation are intact. Psych: AAOx3.  Normal affect.  Labs    Chemistry Recent Labs  Lab 06/23/2020 1248 06/23/20 0241 07-17-2020 0354  NA 144 143 141  K 3.3* 3.8 3.8  CL 101 101 101  CO2 32 31 27   GLUCOSE 182* 136* 125*  BUN 12 14 18   CREATININE 1.16 1.04 1.05  CALCIUM 9.0 8.9 8.7*  GFRNONAA >60 >60 >60  ANIONGAP 11 11 13      Hematology Recent Labs  Lab 06/11/2020 1248 06/23/20 0241 17-Jul-2020 0354  WBC 13.2* 11.0* 10.3  RBC 4.23 4.23 4.02*  HGB 13.1 13.1 12.6*  HCT 41.5 40.6 38.2*  MCV 98.1 96.0 95.0  MCH 31.0 31.0 31.3  MCHC 31.6 32.3 33.0  RDW 14.1 14.1 14.0  PLT 210 182 162    Cardiac EnzymesNo results for input(s): TROPONINI in the last 168 hours. No results for input(s): TROPIPOC in the last 168 hours.   BNP Recent Labs  Lab 06/15/2020 1257  BNP 1,124.3*     DDimer No results for input(s): DDIMER in the last 168 hours.   Radiology    CT Angio Chest PE W/Cm &/Or Wo Cm  Result Date: 06/21/2020 CLINICAL DATA:  PE suspected, shortness of breath EXAM: CT ANGIOGRAPHY CHEST WITH CONTRAST TECHNIQUE: Multidetector CT imaging of the chest was performed using the standard protocol during bolus administration of intravenous contrast. Multiplanar CT image reconstructions and MIPs were obtained to evaluate the  vascular anatomy. CONTRAST:  73mL OMNIPAQUE IOHEXOL 350 MG/ML SOLN COMPARISON:  06/06/2016 FINDINGS: Cardiovascular: Satisfactory opacification of the pulmonary arteries to the segmental level. No evidence of pulmonary embolism. Cardiomegaly. Extensive 3 vessel coronary artery calcifications and/or stents. No pericardial effusion. Enlargement of the main pulmonary artery, measuring up to 3.7 cm in caliber. Aortic atherosclerosis. Mediastinum/Nodes: Enlarged pretracheal and bilateral hilar lymph nodes, largest pretracheal node measuring up to 2.0 x 2.0 cm (series 5, image 69). Thyroid gland, trachea, and esophagus demonstrate no significant findings. Lungs/Pleura: Mild centrilobular emphysema. Diffuse bilateral bronchial wall thickening. Extensive, predominantly dependent bibasilar heterogeneous airspace opacity, most notable in the right lower lobe (series 6, image 98). Small  bilateral pleural effusions. Upper Abdomen: No acute abnormality. Musculoskeletal: No chest wall abnormality. No acute or significant osseous findings. Review of the MIP images confirms the above findings. IMPRESSION: 1.  Negative examination for pulmonary embolism. 2. Diffuse bilateral bronchial wall thickening and extensive, predominantly dependent bibasilar heterogeneous airspace opacity with small associated pleural effusions. Findings are consistent with infection or aspiration. 3.  Mild emphysema. 4.  Enlarged mediastinal lymph nodes, likely reactive. 5.  Cardiomegaly and coronary artery disease. 6. Enlargement of the main pulmonary artery, as can be seen in pulmonary hypertension. Aortic Atherosclerosis (ICD10-I70.0). Electronically Signed   By: Eddie Candle M.D.   On: 06/17/2020 16:22   DG Chest Portable 1 View  Result Date: 06/14/2020 CLINICAL DATA:  87 year old male with history of worsening shortness of breath since yesterday. EXAM: PORTABLE CHEST 1 VIEW COMPARISON:  Chest x-ray 06/02/2018. FINDINGS: Lung volumes are low. Patchy multifocal airspace disease most evident throughout the right upper, right lower and left lower lungs. Relative sparing of the left upper lobe. No definite pleural effusions. Widespread areas of interstitial prominence. No evidence of pulmonary edema. No pneumothorax. Heart size is mildly enlarged. Fullness in the right hilar region. The patient is rotated to the right on today's exam, resulting in distortion of the mediastinal contours and reduced diagnostic sensitivity and specificity for mediastinal pathology. Aortic atherosclerosis. IMPRESSION: 1. The appearance of the chest suggests developing multilobar bilateral bronchopneumonia. Right-sided hilar fullness is noted. Close attention in this region on follow-up studies is recommended, as the possibility of underlying mass and/or lymphadenopathy is not excluded. If there is strong clinical concern for malignancy, further  evaluation with contrast enhanced chest CT could be considered at this time. 2. Mild cardiomegaly. 3. Aortic atherosclerosis. Electronically Signed   By: Vinnie Langton M.D.   On: 06/12/2020 13:13    Telemetry    *** - Personally Reviewed  ECG    *** - Personally Reviewed  Cardiac Studies   ***  Patient Profile     87 y.o. male with a hx of CAD status post PCI, hypertension, hyperlipidemia tobacco abuse, dementia, CKD 3, prostate cancer, paroxysmal atrial fibrillation and prior alcohol abuseadmitted with CAP.  Cardiology consulted for elevated troponin.   Assessment & Plan    1. Elevated troponin: -Felt to be in the setting of demand ischemia secondary to CAP.  -EKG with no acute abnormalities  -No plan for ischemic evaluation given advanced dementia  -Continue beta blocker and statin therapies   2. HLD: -Continue atorvastatin   3. Acute on chronic diastolic CHF: -Prior echo concerning for amyloidosis >>further workup deferred by family  -Weight up slightly yesterday therefore he was treated with IV Lasix  -Continue with quinapril, metoprolol and MWF Arlyce Harman  -Being diuresed with PO Lasix 40 PO QD  -Weight, 201lb today>>was 198lb on admission  -  I&O, net negative 5.3L   4. HTN: -Elevated, 170/90>>166/88>>156/71 - -   5. Persistent atrial fibrillation: -Maintaining  -Continue Eliquis    6. PVCs: -Tele with  -Continue with Eliquis  -Metoprolol increased yesterday to 25mg  BID -Electrolytes stable with K at 3.87>>Mg at 2.1   Signed, Kathyrn Drown NP-C [facility] Pager: [phone] July 08, 2020, 10:59 AM     For questions or updates, please contact   Please consult [email or web address] for contact info under Cardiology/STEMI.

## 2020-06-30 NOTE — Progress Notes (Addendum)
Patient found pulseless this AM by nursing ~1035.  Per RN, alerted to monitor beeping and found pulseless.  Seen at bedside, no pulse, absent breath sounds and absent heart sounds.  Time of death ~28.  Suspect this may have been aspiration event with presentation with pneumonia, though unwitnessed event.  Patient was DNR.  I called his grandson, Stacie Acres, the POA to notify him of his grandfather's passing.  Family shocked with sudden passing, answered questions to best of my abilities.  Suspected aspiration event given hx, though I had not had the opportunity to evaluate him this morning prior to his passing.

## 2020-06-30 DEATH — deceased
